# Patient Record
Sex: Female | Born: 1950 | Race: White | Hispanic: No | Smoking: Never smoker
Health system: Southern US, Community
[De-identification: ages and names within clinical notes are randomized; demographics above are authoritative.]

## PROBLEM LIST (undated history)

## (undated) ENCOUNTER — Emergency Department: Admission: EM | Discharge status: Absent without leave | Payer: Self-pay | Source: Home / Self Care

## (undated) DIAGNOSIS — N200 Calculus of kidney: Secondary | ICD-10-CM

## (undated) DIAGNOSIS — E785 Hyperlipidemia, unspecified: Secondary | ICD-10-CM

## (undated) DIAGNOSIS — Z972 Presence of dental prosthetic device (complete) (partial): Secondary | ICD-10-CM

## (undated) DIAGNOSIS — M47819 Spondylosis without myelopathy or radiculopathy, site unspecified: Secondary | ICD-10-CM

## (undated) DIAGNOSIS — Z9889 Other specified postprocedural states: Secondary | ICD-10-CM

## (undated) DIAGNOSIS — R112 Nausea with vomiting, unspecified: Secondary | ICD-10-CM

## (undated) DIAGNOSIS — J4540 Moderate persistent asthma, uncomplicated: Secondary | ICD-10-CM

## (undated) DIAGNOSIS — M1990 Unspecified osteoarthritis, unspecified site: Secondary | ICD-10-CM

## (undated) DIAGNOSIS — Z9989 Dependence on other enabling machines and devices: Secondary | ICD-10-CM

## (undated) DIAGNOSIS — Z8673 Personal history of transient ischemic attack (TIA), and cerebral infarction without residual deficits: Secondary | ICD-10-CM

## (undated) DIAGNOSIS — Z973 Presence of spectacles and contact lenses: Secondary | ICD-10-CM

## (undated) DIAGNOSIS — I10 Essential (primary) hypertension: Secondary | ICD-10-CM

## (undated) DIAGNOSIS — N201 Calculus of ureter: Secondary | ICD-10-CM

## (undated) DIAGNOSIS — Z9981 Dependence on supplemental oxygen: Secondary | ICD-10-CM

## (undated) DIAGNOSIS — G4733 Obstructive sleep apnea (adult) (pediatric): Secondary | ICD-10-CM

## (undated) DIAGNOSIS — R42 Dizziness and giddiness: Secondary | ICD-10-CM

## (undated) DIAGNOSIS — Z6841 Body Mass Index (BMI) 40.0 and over, adult: Secondary | ICD-10-CM

## (undated) DIAGNOSIS — Z8719 Personal history of other diseases of the digestive system: Secondary | ICD-10-CM

## (undated) DIAGNOSIS — Z8619 Personal history of other infectious and parasitic diseases: Secondary | ICD-10-CM

## (undated) DIAGNOSIS — K219 Gastro-esophageal reflux disease without esophagitis: Secondary | ICD-10-CM

## (undated) HISTORY — PX: CARPAL TUNNEL RELEASE: SHX101

## (undated) HISTORY — DX: Dizziness and giddiness: R42

## (undated) HISTORY — PX: COLONOSCOPY: SHX174

## (undated) HISTORY — PX: TONSILLECTOMY: SUR1361

## (undated) HISTORY — DX: Essential (primary) hypertension: I10

## (undated) HISTORY — DX: Hyperlipidemia, unspecified: E78.5

---

## 1993-10-30 HISTORY — PX: BLADDER SURGERY: SHX569

## 1997-10-30 HISTORY — PX: ABDOMINAL HYSTERECTOMY: SHX81

## 1998-06-10 ENCOUNTER — Ambulatory Visit (HOSPITAL_COMMUNITY): Admission: RE | Admit: 1998-06-10 | Discharge: 1998-06-10 | Payer: Self-pay | Admitting: Family Medicine

## 1998-07-07 ENCOUNTER — Ambulatory Visit (HOSPITAL_COMMUNITY): Admission: RE | Admit: 1998-07-07 | Discharge: 1998-07-07 | Payer: Self-pay | Admitting: *Deleted

## 1998-07-28 ENCOUNTER — Ambulatory Visit (HOSPITAL_COMMUNITY): Admission: RE | Admit: 1998-07-28 | Discharge: 1998-07-28 | Payer: Self-pay | Admitting: *Deleted

## 1998-07-28 ENCOUNTER — Encounter: Payer: Self-pay | Admitting: *Deleted

## 1998-11-26 ENCOUNTER — Other Ambulatory Visit: Admission: RE | Admit: 1998-11-26 | Discharge: 1998-11-26 | Payer: Self-pay | Admitting: Obstetrics and Gynecology

## 1998-11-30 ENCOUNTER — Emergency Department (HOSPITAL_COMMUNITY): Admission: EM | Admit: 1998-11-30 | Discharge: 1998-11-30 | Payer: Self-pay

## 1998-12-13 ENCOUNTER — Encounter: Payer: Self-pay | Admitting: Family Medicine

## 1998-12-13 ENCOUNTER — Ambulatory Visit (HOSPITAL_COMMUNITY): Admission: RE | Admit: 1998-12-13 | Discharge: 1998-12-13 | Payer: Self-pay | Admitting: Family Medicine

## 1999-05-23 ENCOUNTER — Encounter (INDEPENDENT_AMBULATORY_CARE_PROVIDER_SITE_OTHER): Payer: Self-pay | Admitting: Specialist

## 1999-05-23 ENCOUNTER — Inpatient Hospital Stay (HOSPITAL_COMMUNITY): Admission: RE | Admit: 1999-05-23 | Discharge: 1999-05-27 | Payer: Self-pay | Admitting: Obstetrics and Gynecology

## 1999-09-20 ENCOUNTER — Encounter: Payer: Self-pay | Admitting: *Deleted

## 1999-09-20 ENCOUNTER — Ambulatory Visit (HOSPITAL_COMMUNITY): Admission: RE | Admit: 1999-09-20 | Discharge: 1999-09-20 | Payer: Self-pay | Admitting: *Deleted

## 2000-05-03 ENCOUNTER — Ambulatory Visit: Admission: RE | Admit: 2000-05-03 | Discharge: 2000-05-03 | Payer: Self-pay | Admitting: *Deleted

## 2000-05-03 ENCOUNTER — Encounter: Payer: Self-pay | Admitting: *Deleted

## 2001-02-27 ENCOUNTER — Ambulatory Visit (HOSPITAL_COMMUNITY): Admission: RE | Admit: 2001-02-27 | Discharge: 2001-02-27 | Payer: Self-pay | Admitting: *Deleted

## 2001-03-14 ENCOUNTER — Encounter: Admission: RE | Admit: 2001-03-14 | Discharge: 2001-03-14 | Payer: Self-pay | Admitting: *Deleted

## 2001-03-14 ENCOUNTER — Encounter: Payer: Self-pay | Admitting: *Deleted

## 2001-05-31 ENCOUNTER — Encounter: Payer: Self-pay | Admitting: Neurosurgery

## 2001-06-04 ENCOUNTER — Encounter: Payer: Self-pay | Admitting: Neurosurgery

## 2001-06-04 ENCOUNTER — Ambulatory Visit (HOSPITAL_COMMUNITY): Admission: RE | Admit: 2001-06-04 | Discharge: 2001-06-05 | Payer: Self-pay | Admitting: Neurosurgery

## 2001-06-04 HISTORY — PX: ANTERIOR CERVICAL DECOMP/DISCECTOMY FUSION: SHX1161

## 2001-07-19 ENCOUNTER — Encounter: Payer: Self-pay | Admitting: Neurosurgery

## 2001-07-19 ENCOUNTER — Ambulatory Visit (HOSPITAL_COMMUNITY): Admission: RE | Admit: 2001-07-19 | Discharge: 2001-07-19 | Payer: Self-pay | Admitting: Neurosurgery

## 2001-09-02 ENCOUNTER — Ambulatory Visit (HOSPITAL_COMMUNITY): Admission: RE | Admit: 2001-09-02 | Discharge: 2001-09-02 | Payer: Self-pay | Admitting: Neurosurgery

## 2001-09-02 ENCOUNTER — Encounter: Payer: Self-pay | Admitting: Neurosurgery

## 2001-09-13 ENCOUNTER — Encounter: Payer: Self-pay | Admitting: Nephrology

## 2001-09-13 ENCOUNTER — Encounter: Admission: RE | Admit: 2001-09-13 | Discharge: 2001-09-13 | Payer: Self-pay | Admitting: Nephrology

## 2001-12-12 ENCOUNTER — Emergency Department (HOSPITAL_COMMUNITY): Admission: EM | Admit: 2001-12-12 | Discharge: 2001-12-12 | Payer: Self-pay | Admitting: Emergency Medicine

## 2001-12-12 ENCOUNTER — Encounter (INDEPENDENT_AMBULATORY_CARE_PROVIDER_SITE_OTHER): Payer: Self-pay

## 2001-12-12 ENCOUNTER — Encounter: Payer: Self-pay | Admitting: Emergency Medicine

## 2002-02-24 ENCOUNTER — Ambulatory Visit (HOSPITAL_COMMUNITY): Admission: RE | Admit: 2002-02-24 | Discharge: 2002-02-24 | Payer: Self-pay | Admitting: *Deleted

## 2002-02-24 ENCOUNTER — Encounter: Payer: Self-pay | Admitting: *Deleted

## 2002-03-14 ENCOUNTER — Encounter: Payer: Self-pay | Admitting: Neurosurgery

## 2002-03-14 ENCOUNTER — Ambulatory Visit (HOSPITAL_COMMUNITY): Admission: RE | Admit: 2002-03-14 | Discharge: 2002-03-14 | Payer: Self-pay | Admitting: Neurosurgery

## 2002-06-12 ENCOUNTER — Ambulatory Visit (HOSPITAL_COMMUNITY): Admission: RE | Admit: 2002-06-12 | Discharge: 2002-06-12 | Payer: Self-pay | Admitting: Family Medicine

## 2002-06-12 ENCOUNTER — Encounter: Payer: Self-pay | Admitting: Family Medicine

## 2002-06-13 ENCOUNTER — Encounter: Payer: Self-pay | Admitting: Neurosurgery

## 2002-06-13 ENCOUNTER — Encounter: Admission: RE | Admit: 2002-06-13 | Discharge: 2002-06-13 | Payer: Self-pay | Admitting: Neurosurgery

## 2002-06-18 ENCOUNTER — Encounter: Payer: Self-pay | Admitting: Nephrology

## 2002-06-18 ENCOUNTER — Ambulatory Visit (HOSPITAL_COMMUNITY): Admission: RE | Admit: 2002-06-18 | Discharge: 2002-06-18 | Payer: Self-pay | Admitting: Nephrology

## 2002-12-19 ENCOUNTER — Ambulatory Visit (HOSPITAL_COMMUNITY): Admission: RE | Admit: 2002-12-19 | Discharge: 2002-12-19 | Payer: Self-pay | Admitting: Family Medicine

## 2002-12-19 ENCOUNTER — Encounter: Payer: Self-pay | Admitting: Family Medicine

## 2003-11-06 ENCOUNTER — Ambulatory Visit (HOSPITAL_COMMUNITY): Admission: RE | Admit: 2003-11-06 | Discharge: 2003-11-06 | Payer: Self-pay | Admitting: Family Medicine

## 2003-11-07 ENCOUNTER — Emergency Department (HOSPITAL_COMMUNITY): Admission: EM | Admit: 2003-11-07 | Discharge: 2003-11-07 | Payer: Self-pay | Admitting: Emergency Medicine

## 2004-04-11 ENCOUNTER — Encounter: Admission: RE | Admit: 2004-04-11 | Discharge: 2004-04-11 | Payer: Self-pay | Admitting: Nephrology

## 2004-06-08 ENCOUNTER — Encounter: Admission: RE | Admit: 2004-06-08 | Discharge: 2004-06-08 | Payer: Self-pay | Admitting: Gastroenterology

## 2004-09-08 ENCOUNTER — Ambulatory Visit: Admission: RE | Admit: 2004-09-08 | Discharge: 2004-09-08 | Payer: Self-pay | Admitting: Family Medicine

## 2005-06-22 ENCOUNTER — Emergency Department (HOSPITAL_COMMUNITY): Admission: EM | Admit: 2005-06-22 | Discharge: 2005-06-22 | Payer: Self-pay | Admitting: Emergency Medicine

## 2005-06-27 ENCOUNTER — Encounter: Admission: RE | Admit: 2005-06-27 | Discharge: 2005-06-27 | Payer: Self-pay | Admitting: Neurosurgery

## 2005-10-10 ENCOUNTER — Encounter: Admission: RE | Admit: 2005-10-10 | Discharge: 2005-10-10 | Payer: Self-pay | Admitting: Nephrology

## 2006-07-26 ENCOUNTER — Ambulatory Visit: Payer: Self-pay | Admitting: Family Medicine

## 2006-08-07 DIAGNOSIS — Z8659 Personal history of other mental and behavioral disorders: Secondary | ICD-10-CM

## 2006-08-07 DIAGNOSIS — I10 Essential (primary) hypertension: Secondary | ICD-10-CM

## 2006-08-07 DIAGNOSIS — J45901 Unspecified asthma with (acute) exacerbation: Secondary | ICD-10-CM | POA: Insufficient documentation

## 2006-10-04 ENCOUNTER — Telehealth (INDEPENDENT_AMBULATORY_CARE_PROVIDER_SITE_OTHER): Payer: Self-pay | Admitting: *Deleted

## 2006-10-24 ENCOUNTER — Ambulatory Visit: Payer: Self-pay | Admitting: Family Medicine

## 2006-11-12 ENCOUNTER — Ambulatory Visit: Payer: Self-pay | Admitting: Family Medicine

## 2006-12-17 ENCOUNTER — Ambulatory Visit: Payer: Self-pay | Admitting: Family Medicine

## 2006-12-19 ENCOUNTER — Ambulatory Visit: Payer: Self-pay | Admitting: Family Medicine

## 2007-01-06 ENCOUNTER — Emergency Department (HOSPITAL_COMMUNITY): Admission: EM | Admit: 2007-01-06 | Discharge: 2007-01-06 | Payer: Self-pay | Admitting: Emergency Medicine

## 2007-01-08 ENCOUNTER — Telehealth (INDEPENDENT_AMBULATORY_CARE_PROVIDER_SITE_OTHER): Payer: Self-pay | Admitting: *Deleted

## 2007-01-10 ENCOUNTER — Encounter: Payer: Self-pay | Admitting: Family Medicine

## 2007-01-15 ENCOUNTER — Ambulatory Visit (HOSPITAL_COMMUNITY): Admission: RE | Admit: 2007-01-15 | Discharge: 2007-01-15 | Payer: Self-pay | Admitting: General Surgery

## 2007-01-15 ENCOUNTER — Encounter: Payer: Self-pay | Admitting: Family Medicine

## 2007-01-15 ENCOUNTER — Encounter (INDEPENDENT_AMBULATORY_CARE_PROVIDER_SITE_OTHER): Payer: Self-pay | Admitting: *Deleted

## 2007-01-15 HISTORY — PX: LAPAROSCOPIC CHOLECYSTECTOMY: SUR755

## 2007-01-17 ENCOUNTER — Ambulatory Visit: Payer: Self-pay | Admitting: Family Medicine

## 2007-01-21 ENCOUNTER — Inpatient Hospital Stay (HOSPITAL_COMMUNITY): Admission: RE | Admit: 2007-01-21 | Discharge: 2007-01-24 | Payer: Self-pay | Admitting: Orthopedic Surgery

## 2007-01-21 HISTORY — PX: TOTAL KNEE ARTHROPLASTY: SHX125

## 2007-02-09 ENCOUNTER — Encounter: Admission: RE | Admit: 2007-02-09 | Discharge: 2007-02-09 | Payer: Self-pay | Admitting: Orthopedic Surgery

## 2007-02-11 ENCOUNTER — Encounter: Payer: Self-pay | Admitting: Family Medicine

## 2007-07-11 ENCOUNTER — Ambulatory Visit: Payer: Self-pay | Admitting: Family Medicine

## 2007-07-15 ENCOUNTER — Telehealth: Payer: Self-pay | Admitting: Family Medicine

## 2007-07-29 ENCOUNTER — Telehealth: Payer: Self-pay | Admitting: Family Medicine

## 2007-11-01 ENCOUNTER — Ambulatory Visit: Payer: Self-pay | Admitting: Family Medicine

## 2008-05-12 ENCOUNTER — Ambulatory Visit: Payer: Self-pay | Admitting: Family Medicine

## 2008-05-12 ENCOUNTER — Encounter: Admission: RE | Admit: 2008-05-12 | Discharge: 2008-05-12 | Payer: Self-pay | Admitting: Family Medicine

## 2008-05-13 ENCOUNTER — Telehealth (INDEPENDENT_AMBULATORY_CARE_PROVIDER_SITE_OTHER): Payer: Self-pay | Admitting: *Deleted

## 2008-05-14 ENCOUNTER — Telehealth: Payer: Self-pay | Admitting: Family Medicine

## 2008-05-18 ENCOUNTER — Ambulatory Visit: Payer: Self-pay | Admitting: Family Medicine

## 2008-05-19 ENCOUNTER — Encounter: Payer: Self-pay | Admitting: Family Medicine

## 2008-05-19 ENCOUNTER — Ambulatory Visit: Payer: Self-pay

## 2008-05-20 ENCOUNTER — Ambulatory Visit: Payer: Self-pay

## 2008-09-03 ENCOUNTER — Ambulatory Visit: Payer: Self-pay | Admitting: Family Medicine

## 2008-10-19 ENCOUNTER — Encounter: Payer: Self-pay | Admitting: Family Medicine

## 2008-10-20 ENCOUNTER — Encounter: Payer: Self-pay | Admitting: Family Medicine

## 2009-01-06 ENCOUNTER — Ambulatory Visit: Payer: Self-pay | Admitting: Family Medicine

## 2009-01-06 LAB — CONVERTED CEMR LAB
Cholesterol, target level: 200 mg/dL
HDL goal, serum: 40 mg/dL

## 2009-01-08 LAB — CONVERTED CEMR LAB
BUN: 17 mg/dL (ref 6–23)
Band Neutrophils: 0 % (ref 0–10)
Basophils Absolute: 0 10*3/uL (ref 0.0–0.1)
Basophils Relative: 0 % (ref 0–1)
Calcium: 9.6 mg/dL (ref 8.4–10.5)
Creatinine, Ser: 0.98 mg/dL (ref 0.40–1.20)
Eosinophils Relative: 3 % (ref 0–5)
Glucose, Bld: 97 mg/dL (ref 70–99)
Lymphocytes Relative: 39 % (ref 12–46)
MCV: 91.5 fL (ref 78.0–100.0)
Monocytes Absolute: 0.5 10*3/uL (ref 0.1–1.0)
Platelets: 270 10*3/uL (ref 150–400)
Potassium: 4.2 meq/L (ref 3.5–5.3)
RDW: 13.8 % (ref 11.5–15.5)
WBC: 9.1 10*3/uL (ref 4.0–10.5)

## 2009-01-11 ENCOUNTER — Encounter: Admission: RE | Admit: 2009-01-11 | Discharge: 2009-01-11 | Payer: Self-pay | Admitting: Family Medicine

## 2009-01-13 ENCOUNTER — Telehealth: Payer: Self-pay | Admitting: Family Medicine

## 2009-01-14 ENCOUNTER — Encounter: Payer: Self-pay | Admitting: Family Medicine

## 2009-01-15 ENCOUNTER — Telehealth: Payer: Self-pay | Admitting: Family Medicine

## 2009-01-15 DIAGNOSIS — Z9989 Dependence on other enabling machines and devices: Secondary | ICD-10-CM

## 2009-01-15 DIAGNOSIS — G4733 Obstructive sleep apnea (adult) (pediatric): Secondary | ICD-10-CM | POA: Insufficient documentation

## 2009-01-16 ENCOUNTER — Ambulatory Visit: Payer: Self-pay | Admitting: Family Medicine

## 2009-01-22 ENCOUNTER — Encounter: Payer: Self-pay | Admitting: Family Medicine

## 2009-02-01 ENCOUNTER — Encounter: Payer: Self-pay | Admitting: Family Medicine

## 2009-03-08 ENCOUNTER — Ambulatory Visit: Payer: Self-pay | Admitting: Family Medicine

## 2009-03-08 DIAGNOSIS — M2550 Pain in unspecified joint: Secondary | ICD-10-CM

## 2009-03-08 LAB — CONVERTED CEMR LAB
Bilirubin Urine: NEGATIVE
Glucose, Urine, Semiquant: NEGATIVE
Protein, U semiquant: NEGATIVE
Specific Gravity, Urine: 1.02
WBC Urine, dipstick: NEGATIVE
pH: 5.5

## 2009-03-09 ENCOUNTER — Encounter: Payer: Self-pay | Admitting: Family Medicine

## 2009-03-09 LAB — CONVERTED CEMR LAB
ALT: 15 units/L (ref 0–35)
AST: 18 units/L (ref 0–37)
Alkaline Phosphatase: 69 units/L (ref 39–117)
BUN: 17 mg/dL (ref 6–23)
Calcium: 9.2 mg/dL (ref 8.4–10.5)
Chloride: 105 meq/L (ref 96–112)
Creatinine, Ser: 0.86 mg/dL (ref 0.40–1.20)
Sed Rate: 22 mm/hr (ref 0–22)
Total Bilirubin: 0.4 mg/dL (ref 0.3–1.2)

## 2009-03-16 ENCOUNTER — Ambulatory Visit: Payer: Self-pay | Admitting: Family Medicine

## 2009-03-16 DIAGNOSIS — R002 Palpitations: Secondary | ICD-10-CM | POA: Insufficient documentation

## 2009-03-17 ENCOUNTER — Encounter: Admission: RE | Admit: 2009-03-17 | Discharge: 2009-03-17 | Payer: Self-pay | Admitting: Family Medicine

## 2009-03-17 ENCOUNTER — Telehealth (INDEPENDENT_AMBULATORY_CARE_PROVIDER_SITE_OTHER): Payer: Self-pay | Admitting: *Deleted

## 2009-03-17 ENCOUNTER — Encounter: Payer: Self-pay | Admitting: Family Medicine

## 2009-03-18 LAB — CONVERTED CEMR LAB
Basophils Absolute: 0 10*3/uL (ref 0.0–0.1)
Eosinophils Relative: 3 % (ref 0–5)
HCT: 36.9 % (ref 36.0–46.0)
Hemoglobin: 12.2 g/dL (ref 12.0–15.0)
Lymphocytes Relative: 30 % (ref 12–46)
Lymphs Abs: 3 10*3/uL (ref 0.7–4.0)
Monocytes Absolute: 0.7 10*3/uL (ref 0.1–1.0)
Neutro Abs: 6 10*3/uL (ref 1.7–7.7)
RDW: 13.7 % (ref 11.5–15.5)
Saturation Ratios: 14 % — ABNORMAL LOW (ref 20–55)
TIBC: 341 ug/dL (ref 250–470)
TSH: 3.91 microintl units/mL (ref 0.350–4.500)
WBC: 9.9 10*3/uL (ref 4.0–10.5)

## 2009-03-19 ENCOUNTER — Ambulatory Visit: Payer: Self-pay

## 2009-03-25 ENCOUNTER — Encounter: Payer: Self-pay | Admitting: Family Medicine

## 2009-05-04 ENCOUNTER — Telehealth: Payer: Self-pay | Admitting: Family Medicine

## 2009-05-05 ENCOUNTER — Encounter: Admission: RE | Admit: 2009-05-05 | Discharge: 2009-05-05 | Payer: Self-pay | Admitting: Neurosurgery

## 2009-06-11 ENCOUNTER — Encounter: Admission: RE | Admit: 2009-06-11 | Discharge: 2009-06-11 | Payer: Self-pay | Admitting: Family Medicine

## 2009-06-11 ENCOUNTER — Ambulatory Visit: Payer: Self-pay | Admitting: Family Medicine

## 2009-06-11 DIAGNOSIS — M545 Low back pain: Secondary | ICD-10-CM

## 2009-06-21 ENCOUNTER — Encounter: Admission: RE | Admit: 2009-06-21 | Discharge: 2009-06-21 | Payer: Self-pay | Admitting: Neurosurgery

## 2009-06-28 ENCOUNTER — Ambulatory Visit (HOSPITAL_COMMUNITY): Admission: RE | Admit: 2009-06-28 | Discharge: 2009-06-28 | Payer: Self-pay | Admitting: Neurosurgery

## 2009-08-01 ENCOUNTER — Ambulatory Visit: Payer: Self-pay | Admitting: Family Medicine

## 2009-08-09 ENCOUNTER — Ambulatory Visit: Payer: Self-pay | Admitting: Family Medicine

## 2009-08-10 ENCOUNTER — Telehealth: Payer: Self-pay | Admitting: Family Medicine

## 2009-08-12 ENCOUNTER — Telehealth: Payer: Self-pay | Admitting: Family Medicine

## 2009-08-12 ENCOUNTER — Ambulatory Visit: Payer: Self-pay | Admitting: Family Medicine

## 2009-10-05 ENCOUNTER — Emergency Department (HOSPITAL_COMMUNITY): Admission: EM | Admit: 2009-10-05 | Discharge: 2009-10-05 | Payer: Self-pay | Admitting: Emergency Medicine

## 2009-10-07 ENCOUNTER — Ambulatory Visit: Payer: Self-pay | Admitting: Family Medicine

## 2009-10-07 ENCOUNTER — Emergency Department (HOSPITAL_BASED_OUTPATIENT_CLINIC_OR_DEPARTMENT_OTHER): Admission: EM | Admit: 2009-10-07 | Discharge: 2009-10-07 | Payer: Self-pay | Admitting: Emergency Medicine

## 2009-10-07 ENCOUNTER — Ambulatory Visit: Payer: Self-pay | Admitting: Radiology

## 2009-10-07 DIAGNOSIS — R609 Edema, unspecified: Secondary | ICD-10-CM | POA: Insufficient documentation

## 2009-10-08 ENCOUNTER — Encounter (INDEPENDENT_AMBULATORY_CARE_PROVIDER_SITE_OTHER): Payer: Self-pay | Admitting: *Deleted

## 2009-10-12 ENCOUNTER — Ambulatory Visit: Payer: Self-pay | Admitting: Internal Medicine

## 2009-10-25 ENCOUNTER — Emergency Department (HOSPITAL_BASED_OUTPATIENT_CLINIC_OR_DEPARTMENT_OTHER): Admission: EM | Admit: 2009-10-25 | Discharge: 2009-10-25 | Payer: Self-pay | Admitting: Emergency Medicine

## 2009-10-25 ENCOUNTER — Ambulatory Visit: Payer: Self-pay | Admitting: Diagnostic Radiology

## 2009-11-01 ENCOUNTER — Ambulatory Visit (HOSPITAL_COMMUNITY): Admission: RE | Admit: 2009-11-01 | Discharge: 2009-11-01 | Payer: Self-pay | Admitting: Neurosurgery

## 2009-11-24 ENCOUNTER — Ambulatory Visit: Payer: Self-pay | Admitting: Internal Medicine

## 2009-11-29 ENCOUNTER — Telehealth (INDEPENDENT_AMBULATORY_CARE_PROVIDER_SITE_OTHER): Payer: Self-pay | Admitting: *Deleted

## 2009-12-17 ENCOUNTER — Telehealth: Payer: Self-pay | Admitting: Family Medicine

## 2009-12-17 ENCOUNTER — Encounter: Payer: Self-pay | Admitting: Family Medicine

## 2010-01-04 ENCOUNTER — Encounter: Admission: RE | Admit: 2010-01-04 | Discharge: 2010-01-04 | Payer: Self-pay | Admitting: Neurosurgery

## 2010-04-04 ENCOUNTER — Ambulatory Visit: Payer: Self-pay | Admitting: Family Medicine

## 2010-04-04 DIAGNOSIS — S239XXA Sprain of unspecified parts of thorax, initial encounter: Secondary | ICD-10-CM

## 2010-04-04 DIAGNOSIS — M79609 Pain in unspecified limb: Secondary | ICD-10-CM

## 2010-04-18 ENCOUNTER — Encounter: Admission: RE | Admit: 2010-04-18 | Discharge: 2010-04-18 | Payer: Self-pay | Admitting: Family Medicine

## 2010-04-19 LAB — HM MAMMOGRAPHY

## 2010-05-19 ENCOUNTER — Ambulatory Visit: Payer: Self-pay | Admitting: Family Medicine

## 2010-05-23 ENCOUNTER — Encounter: Payer: Self-pay | Admitting: Family Medicine

## 2010-08-12 ENCOUNTER — Emergency Department (HOSPITAL_BASED_OUTPATIENT_CLINIC_OR_DEPARTMENT_OTHER): Admission: EM | Admit: 2010-08-12 | Discharge: 2010-08-12 | Payer: Self-pay | Admitting: Emergency Medicine

## 2010-08-15 ENCOUNTER — Encounter: Payer: Self-pay | Admitting: Family Medicine

## 2010-10-26 ENCOUNTER — Encounter: Payer: Self-pay | Admitting: Family Medicine

## 2010-10-26 ENCOUNTER — Ambulatory Visit
Admission: RE | Admit: 2010-10-26 | Discharge: 2010-10-26 | Payer: Self-pay | Source: Home / Self Care | Attending: Family Medicine | Admitting: Family Medicine

## 2010-10-26 DIAGNOSIS — I809 Phlebitis and thrombophlebitis of unspecified site: Secondary | ICD-10-CM | POA: Insufficient documentation

## 2010-10-26 LAB — CONVERTED CEMR LAB
Hemoglobin: 13.2 g/dL (ref 12.0–15.0)
Lymphocytes Relative: 32 % (ref 12–46)
Lymphs Abs: 3.4 10*3/uL (ref 0.7–4.0)
Neutro Abs: 6.5 10*3/uL (ref 1.7–7.7)
Platelets: 269 10*3/uL (ref 150–400)
RDW: 13.5 % (ref 11.5–15.5)
WBC: 10.6 10*3/uL — ABNORMAL HIGH (ref 4.0–10.5)

## 2010-10-28 LAB — CONVERTED CEMR LAB
ANA Titer 1: 1:40 {titer} — ABNORMAL HIGH
Anti Nuclear Antibody(ANA): POSITIVE — AB
CRP: 2 mg/dL — ABNORMAL HIGH (ref <0.6)
Rhuematoid fact SerPl-aCnc: <20 intl units/mL (ref 0–20)

## 2010-11-03 ENCOUNTER — Encounter: Payer: Self-pay | Admitting: Family Medicine

## 2010-11-03 ENCOUNTER — Ambulatory Visit
Admission: RE | Admit: 2010-11-03 | Discharge: 2010-11-03 | Payer: Self-pay | Source: Home / Self Care | Admitting: Family Medicine

## 2010-11-20 ENCOUNTER — Encounter: Payer: Self-pay | Admitting: Neurosurgery

## 2010-11-27 LAB — CONVERTED CEMR LAB
ALT: 18 units/L (ref 0–35)
AST: 23 units/L (ref 0–37)
Albumin: 4.3 g/dL (ref 3.5–5.2)
Alkaline Phosphatase: 62 units/L (ref 39–117)
Basophils Absolute: 0.1 10*3/uL (ref 0.0–0.1)
Basophils Relative: 1 % (ref 0–1)
Calcium: 9.5 mg/dL (ref 8.4–10.5)
Chloride: 104 meq/L (ref 96–112)
LDL Cholesterol: 96 mg/dL (ref 0–99)
MCHC: 32.6 g/dL (ref 30.0–36.0)
Neutro Abs: 5.5 10*3/uL (ref 1.7–7.7)
Neutrophils Relative %: 59 % (ref 43–77)
Platelets: 297 10*3/uL (ref 150–400)
Potassium: 4.4 meq/L (ref 3.5–5.3)
RBC: 4.59 M/uL (ref 3.87–5.11)
RDW: 13.9 % (ref 11.5–15.5)
Sed Rate: 31 mm/hr — ABNORMAL HIGH (ref 0–22)
Sodium: 141 meq/L (ref 135–145)
TSH: 4.111 microintl units/mL (ref 0.350–4.50)

## 2010-12-01 NOTE — Assessment & Plan Note (Signed)
Summary: PRESSURE IN HEAD AND EARS/WSE (rm 4)   Vital Signs:  Patient Profile:   60 Years Old Female CC:      fever, chills, body aches, dry cough, congestion x 3 days Height:     65.5 inches Weight:      260 pounds O2 Sat:      95 % O2 treatment:    Room Air Temp:     99.7 degrees F oral Pulse rate:   105 / minute Resp:     18 per minute BP sitting:   138 / 78  (right arm) Cuff size:   large  Vitals Entered By: Lajean Saver RN (November 03, 2010 6:03 PM)                  Updated Prior Medication List: TEKTURNA HCT 300-12.5 MG TABS (ALISKIREN-HYDROCHLOROTHIAZIDE) Take 1 tab by mouth once daily ASPIRIN 325 MG TABS (ASPIRIN) take two tablets by mouth every six hours  Current Allergies (reviewed today): ! ACETAMINOPHEN (ACETAMINOPHEN) ! E.E.S. 400 (ERYTHROMYCIN ETHYLSUCCINATE) ! MORPHINE SULFATE CR (MORPHINE SULFATE) ! SULFA ! * HYZAAR ! DIOVANHistory of Present Illness Chief Complaint: fever, chills, body aches, dry cough, congestion x 3 days History of Present Illness:  Subjective: Patient complains of sore throat for 2 days. + cough, worse at night, non-productive No pleuritic pain + wheezing + nasal congestion No post-nasal drainage No sinus pain/pressure No itchy/red eyes No earache No hemoptysis +SOB No fever, + chills No nausea No vomiting No abdominal pain No diarrhea No skin rashes + fatigue + myalgias No headache Used OTC meds without relief   REVIEW OF SYSTEMS Constitutional Symptoms       Complains of fever, chills, and fatigue.     Denies night sweats, weight loss, and weight gain.      Comments: body aches Eyes       Denies change in vision, eye pain, eye discharge, glasses, contact lenses, and eye surgery. Ear/Nose/Throat/Mouth       Complains of sinus problems.      Denies hearing loss/aids, change in hearing, ear pain, ear discharge, dizziness, frequent runny nose, frequent nose bleeds, sore throat, hoarseness, and tooth pain or  bleeding.  Respiratory       Complains of dry cough and shortness of breath.      Denies productive cough, wheezing, asthma, bronchitis, and emphysema/COPD.  Cardiovascular       Denies murmurs, chest pain, and tires easily with exhertion.    Gastrointestinal       Denies stomach pain, nausea/vomiting, diarrhea, constipation, blood in bowel movements, and indigestion. Genitourniary       Denies painful urination, kidney stones, and loss of urinary control. Neurological       Denies paralysis, seizures, and fainting/blackouts. Musculoskeletal       Denies muscle pain, joint pain, joint stiffness, decreased range of motion, redness, swelling, muscle weakness, and gout.  Skin       Denies bruising, unusual mles/lumps or sores, and hair/skin or nail changes.  Psych       Denies mood changes, temper/anger issues, anxiety/stress, speech problems, depression, and sleep problems. Other Comments: Symptoms started 3 days ago. Taken Delsym and antihistamine. Patient finished a course of Prednisone today prescribed by Dr. Linford Arnold for her knees   Past History:  Past Medical History: Reviewed history from 05/19/2010 and no changes required. ARM NUMBNESS (ICD-782.0) PALPITATIONS (ICD-785.1) ORTHOSTATIC DIZZINESS (ICD-780.4) PAIN IN JOINT, MULTIPLE SITES (ICD-719.49) OBSTRUCTIVE SLEEP APNEA (ICD-327.23) CANDIDIASIS, SKIN/NAILS (  ICD-112.3) HYPERTENSION, BENIGN SYSTEMIC (ICD-401.1)     - Rec d/c ACE October 13, 2009 for pseudoasthma >  dramatic improvement  ASTHMA NOS W/ACUTE EXACERBATION [phone])     - PFT's wnl November 24, 2009 during ? uri cough DEPRESSION, MAJOR, RECURRENT (ICD-296.30) Hx of proteinuria -See Dr. Briant Cedar Cardiolyte stress test neg 05-19-08 at [facility] degenerative spine  Past Surgical History: Reviewed history from 07/11/2007 and no changes required. Bladder surgery  1995, c/sec and tubal ligation 1979 Hysterectomy  1999, Neck sx for ruptured disc C4-5   2001 Tonsillectomy  age 49 Dr.Tim Davis-cholecystectomy 3/08 Knee replacment in 2008  Family History: Reviewed history from 10/12/2009 and no changes required. 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  Never smoked, no EtOH, no drugs, no caffeine, swims occ for exercise.   Objective:  No acute distress  Eyes:  Pupils are equal, round, and reactive to light and accomdation.  Extraocular movement is intact.  Conjunctivae are not inflamed.  Ears:  Canals normal.  Tympanic membranes normal.   Nose:  Normal septum.  Normal turbinates, mildly congested.    No sinus tenderness present.  Pharynx:  Normal  Neck:  Supple.  Slightly tender shotty /posterior nodes are palpated bilaterally.  Lungs:  Clear to auscultation.  Breath sounds are equal.  Heart:  Regular rate and rhythm without murmurs, rubs, or gallops.  Abdomen:  Nontender without masses or hepatosplenomegaly.  Bowel sounds are present.  No CVA or flank tenderness.  Skin:  No rash Assessment New Problems: UPPER RESPIRATORY INFECTION, ACUTE (ICD-465.9)   Plan New Medications/Changes: AZITHROMYCIN 250 MG TABS (AZITHROMYCIN) Two tabs by mouth on day 1, then 1 tab daily on days 2 through 5 (Rx void after 11/11/10)  #6 tabs x 0, 11/03/2010, Donna Christen MD BENZONATATE 200 MG CAPS (BENZONATATE) One by mouth hs as needed cough  #12 x 0, 11/03/2010, Donna Christen MD  New Orders: Pulse Oximetry (single measurment) [94760] Est. Patient Level III [54098] Planning Comments:   Treat symptomatically for now:  Increase fluid intake, begin expectorant/decongestant, cough suppressant at bedtime.  If fever/chills/sweats persist, or if not improving 5   days begin Z-pack (given Rx to hold).  Followup with PCP if not improving 10 to 14 days.   The patient and/or caregiver has been counseled thoroughly with regard to medications prescribed including dosage, schedule, interactions, rationale for use, and possible side effects and they verbalize understanding.  Diagnoses and expected course of recovery discussed and will return if not improved as expected or if the condition worsens. Patient and/or caregiver verbalized understanding.  Prescriptions: AZITHROMYCIN 250 MG TABS (AZITHROMYCIN) Two tabs by mouth on day 1, then 1 tab daily on days 2 through 5 (Rx void after 11/11/10)  #6 tabs x 0   Entered and Authorized by:   Donna Christen MD   Signed by:   Donna Christen MD on 11/03/2010   Method used:   Print then Give to Patient   RxID:   [id number] BENZONATATE 200 MG CAPS (BENZONATATE) One by mouth hs as needed cough  #12 x 0   Entered and Authorized by:   Donna Christen MD   Signed by:   Donna Christen MD on 11/03/2010  Method used:   Print then Give to Patient   RxID:   [id number]   Patient Instructions: 1)  May use Mucinex  (guaifenesin) twice daily for congestion. 2)  Increase fluid intake, rest. 3)  May use Afrin nasal spray (or generic oxymetazoline) twice daily for about 5 days.  Also recommend using saline nasal spray several times daily and/or saline nasal irrigation. 4)  Begin azithromycin if not improving about 5 days or if persistent fever develops 5)  Followup with family doctor if not improving 10 to 14 days.   Orders Added: 1)  Pulse Oximetry (single measurment) [94760] 2)  Est. Patient Level III [14782]

## 2010-12-01 NOTE — Assessment & Plan Note (Signed)
Summary: Foot pain, f/u phlebitis, HTN   Vital Signs:  Patient profile:   60 year old female Height:      65.5 inches Weight:      269 pounds Pulse rate:   84 / minute BP sitting:   118 / 69  (right arm) Cuff size:   large  Vitals Entered By: Avon Gully CMA, Duncan Dull) (October 26, 2010 9:11 AM) CC: pain in feet and legs,fatigue,dx with phlebitis a few weeks ago take asprin for this   Primary Care Provider:  Nani Gasser MD  CC:  pain in feet and legs, fatigue, and dx with phlebitis a few weeks ago take asprin for this.  History of Present Illness: pain in feet and legs,fatigue,dx with phlebitis a few weeks ago take asprin for this. Pain and redness started on her left anterior leg and dx with superficial clots via [place]. Dxwith phlebitis.  Taking 2 adult ASA every 6 hours. Has been taking now for 2 weeks and has reduced to ASA two times a day. Legs and feet are painful when up on her feet for a long period.  When sits for a long time they feel stiff and painful. Every morning having pain in her bladder area that radiates to her back. No dysuria or itching.  No fever. At nigh will wake up iwth a hot shooting pain in a half circuleover the medial mid foot on both feet.  Does have hx of low back pain. This started about a month ago. Feels some better when moves her feet around. othign really seem to help her pain. Has some prednisone left over from ortho given to her for her knee. Has used it since last Spring.   Current Medications (verified): 1)  Tekturna Hct 300-12.5 Mg Tabs (Aliskiren-Hydrochlorothiazide) .... Take 1 Tab By Mouth Once Daily 2)  Aspirin 325 Mg Tabs (Aspirin) .... Take Two Tablets By Mouth Every Six Hours  Allergies (verified): 1)  ! Acetaminophen (Acetaminophen) 2)  ! E.e.s. 400 (Erythromycin Ethylsuccinate) 3)  ! Morphine Sulfate Cr (Morphine Sulfate) 4)  ! Sulfa 5)  ! * Hyzaar 6)  ! Diovan  Comments:  Nurse/Medical Assistant: The patient's medications  and allergies were reviewed with the patient and were updated in the Medication and Allergy Lists. 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  Has 3 adult children.  Never smoked, no EtOH, no drugs, no caffeine, swims occ for exercise.  Physical Exam  General:  Well-developed,well-nourished,in no acute distress; alert,appropriate and cooperative throughout examination Head:  Normocephalic and atraumatic without obvious abnormalities. No apparent alopecia or balding. Msk:  Left anterior shin with some redness and swelling. Erythema is midl and not well demarcated. TEnder to palpate.  Ankles with NROM adn no swelling.  NO laxity. Strength 5/5 in teh ankles.   Extremities:  DP pulses 2+ bilat.  Skin:  Dry skin on the bottoms of her feet.    Impression & Recommendations:  Problem # 1:  FOOT PAIN, BILATERAL (ICD-729.5) Unclear etiology. Doesn't sound classic for rheumatoi but will check a sed rate.  Check CBC to rule out blood d/o and will check thyroid as welll. Also check uric acid level thought not classic for gout eithr. She may have more structual foot pain. May benefit from orthotics and good shoes.  She also has a hx of severe OA  in her knee and back so this may be contributing as well. Neuroma is less likely based on the position of her pain.  Orders: T-CBC w/Diff [phone]) T-TSH [phone]) T-Sed Rate (Automated) [phone]) T-Uric Acid (Blood) [phone])  Problem # 2:  PHLEBITIS (ICD-451.9) Recommend continue her ASA for 1 more week and use ACE wrap for compression.  Her updated medication list for this problem includes:    Aspirin 325 Mg Tabs (Aspirin) .Marland Kitchen... Take two tablets by mouth every six hours  Problem # 3:  HYPERTENSION, BENIGN SYSTEMIC (ICD-401.1) Looks fantastic.  If continue to loose weight will be  able to dec her BP med.  Her updated medication list for this problem includes:    Tekturna Hct 300-12.5 Mg Tabs (Aliskiren-hydrochlorothiazide) .Marland Kitchen... Take 1 tab by mouth once daily  Complete Medication List: 1)  Tekturna Hct 300-12.5 Mg Tabs (Aliskiren-hydrochlorothiazide) .... Take 1 tab by mouth once daily 2)  Aspirin 325 Mg Tabs (Aspirin) .... Take two tablets by mouth every six hours   Patient Instructions: 1)  prednisone 5mg  p two times a day for one week adn see if you feel better. If not better then let me know.  2)  Recommend continue her ASA for 1 more week and use ACE wrap for compression.    Orders Added: 1)  T-CBC w/Diff [[id number]] 2)  T-TSH [[id number]] 3)  T-Sed Rate (Automated) [03474-25956] 4)  T-Uric Acid (Blood) [84550-23180] 5)  Est. Patient Level IV [38756]

## 2010-12-01 NOTE — Progress Notes (Signed)
Summary: Avalide changed to Diovan  Phone Note From Pharmacy Call back at [phone]   Caller: [facility] [address]* Call For: wert  Summary of Call: calling about avalide 300/12.5 . manufacturer have recall need alternative Initial call taken by: Rickard Patience,  November 29, 2009 3:33 PM  Follow-up for Phone Call        MW---the Adan Sis has been recalled---what can we change this to?  please advise.  thanks [address] [facility]  November 29, 2009 3:34 PM  diovan 320/12/5 one dialy Follow-up by: Nyoka Cowden MD,  November 29, 2009 4:53 PM  Additional Follow-up for Phone Call Additional follow up Details #1::        [address], Spoke with Elmo.  informed Benny ok per MW to chagne avalide to Diovan 320/12.5 once daily #30 x5.  He verbalized understanding.  Med changed in EMR.   Additional Follow-up by: Gweneth Dimitri RN,  November 29, 2009 5:04 PM    New/Updated Medications: DIOVAN HCT 320-12.5 MG TABS (VALSARTAN-HYDROCHLOROTHIAZIDE) once daily Prescriptions: DIOVAN HCT 320-12.5 MG TABS (VALSARTAN-HYDROCHLOROTHIAZIDE) once daily  #30 x 5   Entered by:   Gweneth Dimitri RN   Authorized by:   Nyoka Cowden MD   Signed by:   Gweneth Dimitri RN on 11/29/2009   Method used:   Telephoned to ...       [facility]* (retail)       [address] [place], [place]  [address]       Ph: [phone]       Fax: [phone]   RxID:   [id number]

## 2010-12-01 NOTE — Letter (Signed)
Summary: Out of Work  [facility]  [address]   [place], [place] [address]   Phone: [phone]  Fax: [phone]    April 04, 2010   Employee:  JERICCA RUSSETT [employer]    To Whom It May Concern:   For Medical reasons, please excuse the above named employee from work for the following dates:  Start:   June 6th  End:   June 7th  If you need additional information, please feel free to contact our office.         Sincerely,    Seymour Bars DO

## 2010-12-01 NOTE — Progress Notes (Signed)
Summary: Wants to stop zoloft  Call pt: It would be better if took every other day for 2 weeks and then stop.  February 18, 20112:32 PM Gerrod Maule MD, Santina Evans  12/17/2009 @ 2:39pm- Pt notified of results. KJ LPN

## 2010-12-01 NOTE — Assessment & Plan Note (Signed)
Summary: FOOT AND BACK PAIN//VGJ   Vital Signs:  Patient profile:   60 year old female Height:      65.5 inches Weight:      288 pounds BMI:     47.37 O2 Sat:      98 % on Room air Pulse rate:   91 / minute BP sitting:   135 / 78  (left arm) Cuff size:   large  Vitals Entered By: Payton Spark CMA (April 04, 2010 10:29 AM)  O2 Flow:  Room air CC: Mid back pain x 1 week and L foot pain. No known injury.    Primary Care Provider:  Nani Gasser MD  CC:  Mid back pain x 1 week and L foot pain. No known injury. Marland Kitchen  History of Present Illness: 59 yo WF presents for thoracic pain x 1 wk.  She has Lumbar DDD and is getting injejctions for this thru the neurosurgeon.  She is using Lidoderm patches over the lateral thoracic muscles.  She is not sure how this started.  She was not doing any heavy lifting or overuse.  For the past 2 days, her L foot started hurting.  She denies any injury.  She has had plantar fasciitis in the past on the R side.    She is taking Ibuprofen which helped a little bit.    Current Medications (verified): 1)  Tekturna Hct 300-12.5 Mg Tabs (Aliskiren-Hydrochlorothiazide) .... Take 1 Tab By Mouth Once Daily  Allergies (verified): 1)  ! Acetaminophen (Acetaminophen) 2)  ! E.e.s. 400 (Erythromycin Ethylsuccinate) 3)  ! Morphine Sulfate Cr (Morphine Sulfate) 4)  ! Sulfa 5)  ! * Hyzaar 6)  ! Diovan  Past History:  Past Medical History: Reviewed history from 11/24/2009 and no changes required. ARM NUMBNESS (ICD-782.0) PALPITATIONS (ICD-785.1) ORTHOSTATIC DIZZINESS (ICD-780.4) PAIN IN JOINT, MULTIPLE SITES (ICD-719.49) FREQUENCY, URINARY (ICD-788.41) FINGER SPRAIN (ICD-842.10) OBSTRUCTIVE SLEEP APNEA (ICD-327.23) SNORING (ICD-786.09) INSECT BITE (ICD-919.4) CHEST PAIN, LEFT (ICD-786.50) SINUSITIS, ACUTE (ICD-461.9) CANDIDIASIS, SKIN/NAILS (ICD-112.3) HYPERTENSION, BENIGN SYSTEMIC (ICD-401.1)     - Rec d/c ACE October 13, 2009 for pseudoasthma  >  dramatic improvement  ASTHMA NOS W/ACUTE EXACERBATION (ONG-295.28)     - PFT's wnl November 24, 2009 during ? uri cough DEPRESSION, MAJOR, RECURRENT (ICD-296.30)   Hx of proteinuria -See Dr. Briant Cedar Cardiolyte stress test neg 05-19-08 at [facility]  Social History: Reviewed history from 08/07/2006 and no changes required. 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  Never smoked, no EtOH, no drugs, no caffeine, swims occ for exercise.  Review of Systems      See HPI  Physical Exam  General:  morbidly obese WF in NAD Lungs:  Normal respiratory effort, chest expands symmetrically. Lungs are clear to auscultation, no crackles or wheezes. Heart:  Normal rate and regular rhythm. S1 and S2 normal without gallop, murmur, click, rub or other extra sounds. Msk:  tender over R lateral thoracic muscles (rhomboids/ lats) with limited rotation bilat and limited SB.  full UE ROM Pulses:  2+ pedal pulses Extremities:  tender over the medial arch L foot.  fairly normal foot anatomy with tender supination/ neg heel squeeze   Impression & Recommendations:  Problem # 1:  THORACIC STRAIN (ICD-847.1) MSK throacic back pain.  Will treat with Mobic during the day and flexeril at night + warm moist heat and gentle stretching.  If not improved in 2 wks, will  start PT and consider Xray.  Inactivity and obesity may be constributing factors.  Problem # 2:  FOOT PAIN, LEFT (ICD-729.5) Just a couple days of L foot pain right at the arch-- likely to be mechanical foot pain given her hx and exam findings.  She is wearing ill fitting poorly supportive sandals today and given her BMI of 47, this is not enough support for her.  Advisedchanging footwears to running shoes everyday x 2 wks.  If not improving, she is free to go back to her podiatrist.  Complete Medication List: 1)  Tekturna Hct 300-12.5 Mg Tabs (Aliskiren-hydrochlorothiazide)  .... Take 1 tab by mouth once daily 2)  Meloxicam 15 Mg Tabs (Meloxicam) .Marland Kitchen.. 1 tab by mouth once a day with food 3)  Flexeril 10 Mg Tabs (Cyclobenzaprine hcl) .Marland Kitchen.. 1 tab by mouth at bedtime as needed back pain  Patient Instructions: 1)  Start Meloxicam once a day as your anti inflammatory. 2)  Add Flexeril at bedtime as your muscle relaxer. 3)  OK to use Lidoderm patches, heating pad and gentle stretching. 4)  If back pain not improving in 10 days, call for PT referral. 5)  For L foot pain, wear supportive athletic shoes. Prescriptions: FLEXERIL 10 MG TABS (CYCLOBENZAPRINE HCL) 1 tab by mouth at bedtime as needed back pain  #24 x 0   Entered and Authorized by:   Seymour Bars DO   Signed by:   Seymour Bars DO on 04/04/2010   Method used:   Electronically to        [facility]* (retail)       [address] [place], [place]  [address]       Ph: [phone]       Fax: [phone]   RxID:   [id number] MELOXICAM 15 MG TABS (MELOXICAM) 1 tab by mouth once a day with food  #24 x 0   Entered and Authorized by:   Seymour Bars DO   Signed by:   Seymour Bars DO on 04/04/2010   Method used:   Electronically to        [facility]* (retail)       [address] [place], [place]  [address]       Ph: [phone]       Fax: [phone]   RxID:   [id number]

## 2010-12-01 NOTE — Miscellaneous (Signed)
Summary: Orders Update pft charges  Clinical Lists Changes  Orders: Added new Service order of Carbon Monoxide diffusing w/capacity (94720) - Signed Added new Service order of Lung Volumes (94240) - Signed Added new Service order of Spirometry (Pre & Post) (94060) - Signed 

## 2010-12-01 NOTE — Assessment & Plan Note (Signed)
Summary: SOB & COUGH/KH   Vital Signs:  Patient Profile:   60 Years Old Female CC:      body aches, nausea, SOB X 1 week Height:     65.5 inches Weight:      295 pounds O2 Sat:      97 % O2 treatment:    Room Air Temp:     97.7 degrees F oral Pulse rate:   97 / minute Resp:     22 per minute BP sitting:   135 / 83  (right arm) Cuff size:   large  Pt. in pain?   no  Vitals Entered By: Lajean Saver RN (May 19, 2010 8:16 AM)                   Updated Prior Medication List: TEKTURNA HCT 300-12.5 MG TABS (ALISKIREN-HYDROCHLOROTHIAZIDE) Take 1 tab by mouth once daily MELOXICAM 15 MG TABS (MELOXICAM) 1 tab by mouth once a day with food FLEXERIL 10 MG TABS (CYCLOBENZAPRINE HCL) 1 tab by mouth at bedtime as needed back pain  Current Allergies (reviewed today): ! ACETAMINOPHEN (ACETAMINOPHEN) ! E.E.S. 400 (ERYTHROMYCIN ETHYLSUCCINATE) ! MORPHINE SULFATE CR (MORPHINE SULFATE) ! SULFA ! * HYZAAR ! DIOVANHistory of Present Illness Chief Complaint: body aches, nausea, SOB X 1 week History of Present Illness:  Subjective:  Patient complains of a mild increase in cough for about a week, and mild shortness of breath with activity.  She denies shortness of breath at rest, and none at night.  Her cough does not awaken her.  Her cough has been slightly productive with slight color change of sputum.  No wheezing.  No chest pain with cough.   She has had intermittent sweats but no fever.  She has noted arthralgias but no myalgias.  No other respiratory symptoms such as sore throat or nasal congestion.  She has noticed increased swelling in her legs for about two weeks, not present upon awakening however.  No indigestion or heartburn.  She states that she has been drinking more commercially packaged tea during the hot weather of the past several weeks.  REVIEW OF SYSTEMS Constitutional Symptoms       Complains of night sweats and fatigue.     Denies fever, chills, weight loss, and weight  gain.  Eyes       Denies change in vision, eye pain, eye discharge, glasses, contact lenses, and eye surgery. Ear/Nose/Throat/Mouth       Denies hearing loss/aids, change in hearing, ear pain, ear discharge, dizziness, frequent runny nose, frequent nose bleeds, sinus problems, sore throat, hoarseness, and tooth pain or bleeding.  Respiratory       Complains of productive cough.      Denies dry cough, wheezing, shortness of breath, asthma, bronchitis, and emphysema/COPD.      Comments: in the AM Cardiovascular       Denies murmurs, chest pain, and tires easily with exhertion.    Gastrointestinal       Complains of nausea/vomiting.      Denies stomach pain, diarrhea, constipation, blood in bowel movements, and indigestion.      Comments: denies vomiting Genitourniary       Denies painful urination, kidney stones, and loss of urinary control. Neurological       Denies paralysis, seizures, and fainting/blackouts. Musculoskeletal       Complains of muscle pain and joint pain.      Denies joint stiffness, decreased range of motion, redness, swelling, muscle weakness, and  gout.      Comments: body aches, pain in left scapula Skin       Denies bruising, unusual mles/lumps or sores, and hair/skin or nail changes.  Psych       Denies mood changes, temper/anger issues, anxiety/stress, speech problems, depression, and sleep problems. Other Comments: major c/o body aches   Past History:  Past Medical History: ARM NUMBNESS (ICD-782.0) PALPITATIONS (ICD-785.1) ORTHOSTATIC DIZZINESS (ICD-780.4) PAIN IN JOINT, MULTIPLE SITES (ICD-719.49) OBSTRUCTIVE SLEEP APNEA (ICD-327.23) CANDIDIASIS, SKIN/NAILS (ICD-112.3) HYPERTENSION, BENIGN SYSTEMIC (ICD-401.1)     - Rec d/c ACE October 13, 2009 for pseudoasthma >  dramatic improvement  ASTHMA NOS W/ACUTE EXACERBATION ([id number]-096.04)     - PFT's wnl November 24, 2009 during ? uri cough DEPRESSION, MAJOR, RECURRENT (ICD-296.30) Hx of proteinuria -See Dr.  Briant Cedar Cardiolyte stress test neg 05-19-08 at [facility] degenerative spine  Past Surgical History: Reviewed history from 07/11/2007 and no changes required. Bladder surgery  1995, c/sec and tubal ligation 1979 Hysterectomy  1999, Neck sx for ruptured disc C4-5  2001 Tonsillectomy  age 19 Dr.Tim Davis-cholecystectomy 3/08 Knee replacment in 2008  Family History: Reviewed history from 10/12/2009 and no changes required. 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  Never smoked, no EtOH, no drugs, no caffeine, swims occ for exercise.   Objective:  Obese middle aged female, alert and oriented, and in no distress Eyes:  Pupils are equal, round, and reactive to light and accomdation.  Extraocular movement is intact.  Conjunctivae are not inflamed.  Pharynx:  Normal  Neck:  Supple.  No adenopathy is present.  No thyromegaly is present  Lungs:  Clear to auscultation.  Breath sounds are equal.  Heart:  Regular rate and rhythm without murmurs, rubs, or gallops.  Abdomen:  Nontender without masses or hepatosplenomegaly.  Bowel sounds are present.  No CVA or flank tenderness.  Extremities:  Trace non-pitting  edema.  Pedal pulses are full and equal.  No joint tenderness CBC:  WBC 9.6, Hgb 13.0 Assessment PATIENT HAS GAINED 7 POIUNDS SINCE HER PREVIOUS VISIT WITH PCP.  SUSPECT FLUID RETENTION AS CONTRIBUTING FACTOR TO HER INCREASED COUGH  Plan New Orders: CBC w/Diff [[id number]] T-Sed Rate (Automated) [[id number]] Est. Patient Level IV [86578] Planning Comments:   Recommend that patient avoid commercially prepared drinks that contain preservatives.  Continue hydration with water.   Minimize table salt.  Resume Sybicort. Will check sed rate because of her complaint of arthralgias. Follow-up with PCP if cough persists or becomes worse over the next two weeks.   The patient and/or caregiver has been counseled thoroughly with regard to medications prescribed including dosage, schedule, interactions, rationale for use, and possible side effects and they verbalize understanding.  Diagnoses and expected course of recovery discussed and will return if not improved as expected or if the condition worsens. Patient and/or caregiver verbalized understanding.   Orders Added: 1)  CBC w/Diff [[id number]] 2)  T-Sed Rate (Automated) [[id number]] 3)  Est. Patient Level IV [25366]

## 2010-12-01 NOTE — Assessment & Plan Note (Signed)
Summary: Pulmonary/ final f/u ov   Copy to:  [facility] Primary Provider/Referring Provider:  Nani Gasser MD  CC:  1 month followup.  Pt c/o prod cough in the am for the past 4-5 days.  States that sputum is yellow/clear. She states that her breathing is much better.Alison Richards  History of Present Illness: 4 yowf never smoker with ? 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  October 12, 2009 cc variable sob so bad can't go from room to next without x 2-3 years better after inhaler but one week ago even that didn't work and went to ER x2  rx with neb and prednisone and some better today but still sob with use of voice or any exertion.  Also sense of choking during spells. no gag or vomit. stop advair and lisinopril Avapro 300 mg one daily Use Symbicort 80  2 puffs first thing  in am and 2 puffs again in pm about 12 hours later if needed (not 100%)  November 24, 2009 1 month followup.  Pt c/o prod cough in the am for the past 4-5 days.  States that sputum is yellow/clear. She states that her breathing is much better and not requiring any symbicort or proaire x 3-4 days.  Pt denies any significant sore throat, dysphagia, itching, sneezing,  nasal congestion or excess secretions,  fever, chills, sweats, unintended wt loss, pleuritic or exertional cp, hempoptysis, change in activity tolerance  orthopnea pnd or leg swelling. Pt also denies any obvious fluctuation in symptoms with weather or environmental change or other alleviating or aggravating factors.       Current Medications (verified): 1)  Zoloft 25 Mg Tabs (Sertraline Hcl) .... Once Daily By Mouth 2)  Proair Hfa 108 (90 Base) Mcg/act Aers (Albuterol Sulfate) .... 2-4 Puffs Inhaled  Every 4-6 Hours As Needed Sob 3)  Benzonatate 200 Mg Caps (Benzonatate) .... One By Mouth Three Times A Day As Needed For Cough 4)  Symbicort 80-4.5 Mcg/act  Aero (Budesonide-Formoterol Fumarate) .... 2 Puffs First Thing  in Am  and 2 Puffs Again in Pm About 12 Hours Later If Needed 5)  Avalide 300-12.5 Mg Tabs (Irbesartan-Hydrochlorothiazide) .Alison Richards.. 1 Once Daily  Allergies (verified): 1)  ! Acetaminophen (Acetaminophen) 2)  ! E.e.s. 400 (Erythromycin Ethylsuccinate) 3)  ! Morphine Sulfate Cr (Morphine Sulfate) 4)  ! Sulfa 5)  ! * Hyzaar 6)  ! Diovan  Past History:  Past Medical History: ARM NUMBNESS (ICD-782.0) PALPITATIONS (ICD-785.1) ORTHOSTATIC DIZZINESS (ICD-780.4) PAIN IN JOINT, MULTIPLE SITES (ICD-719.49) FREQUENCY, URINARY (ICD-788.41) FINGER SPRAIN (ICD-842.10) OBSTRUCTIVE SLEEP APNEA (ICD-327.23) SNORING (ICD-786.09) INSECT BITE (ICD-919.4) CHEST PAIN, LEFT (ICD-786.50) SINUSITIS, ACUTE (ICD-461.9) CANDIDIASIS, SKIN/NAILS (ICD-112.3) HYPERTENSION, BENIGN SYSTEMIC (ICD-401.1)     - Rec d/c ACE October 13, 2009 for pseudoasthma >  dramatic improvement  ASTHMA NOS W/ACUTE EXACERBATION [phone])     - PFT's wnl November 24, 2009 during ? uri cough DEPRESSION, MAJOR, RECURRENT (ICD-296.30)   Hx of proteinuria -See Dr. Briant Cedar Cardiolyte stress test neg 05-19-08 at [facility]  Vital Signs:  Patient profile:   60 year old female Weight:      285 pounds BMI:     46.87 O2 Sat:      95 % on Room air Temp:     97.6 degrees F oral Pulse rate:   81 / minute BP sitting:   110 / 72  (left arm) Cuff size:   large  Vitals Entered By: Vernie Murders (  November 24, 2009 11:28 AM)  O2 Flow:  Room air  Physical Exam  Additional Exam:  twt 288 > 286 October 12, 2009  > 285 November 24, 2009  amb wf with  no longer  Voice fatigue and pseudowheeze  HEENT: nl dentition, turbinates, and orophanx. Nl external ear canals without cough reflex Neck without JVD/Nodes/TM Lungs clear to A and P bilaterally without cough on insp or exp maneuvers RRR no s3 or murmur or increase in P2 Abd soft and benign with nl excursion in the supine position. No bruits or organomegaly Ext warm without calf  tenderness, cyanosis clubbing or edema     Impression & Recommendations:  Problem # 1:  ASTHMA NOS W/ACUTE EXACERBATION (ICD-493.92) Not clear at all she has asthma, more likely this is Upper airway cough syndrome, so named because it's frequently impossible to sort out how much is LPR vs CR/sinusitis with freq throat clearing generating secondary extra esophageal GERD from wide swings in gastric pressure that occur with throat clearing, promoting self use of mint and menthol lozenges that reduce the lower esophageal sphincter tone and exacerbate the problem further.  These symptoms are easily confused with asthma/copd by even experienced pulmonogists because they overlap so much. These are the same pts who not infrequently have failed to tolerate ace inhibitors,  dry powder inhalers or biphosphonates or report having reflux symptoms that don't respond to standard doses of PPI  See instructions for specific recommendations   Problem # 2:  HYPERTENSION, BENIGN SYSTEMIC (ICD-401.1)  The following medications were removed from the medication list:    Hydrochlorothiazide 25 Mg Tabs (Hydrochlorothiazide) .Alison Richards... 1/2 once daily    Avapro 300 Mg Tabs (Irbesartan) ..... One tablet by mouth daily Her updated medication list for this problem includes:    Avalide 300-12.5 Mg Tabs (Irbesartan-hydrochlorothiazide) .Alison Richards... 1 once daily (note couldn't tolerate cozaar or diovan but fine on avalide)  ACE inhibitors are problematic in  pts with airway complaints because  even experienced pulmononlogists can't always distinguish ace effects from copd/asthma.  By themselves they don't actually cause a problem, much like oxygen can't by itself start a fire, but they certainly serve as a powerful catalyst or enhancer for any "fire"  or inflammatory process in the upper airway, be it caused by an ET  tube or more commonly reflux (especially in the obese or pts with known GERD or who are on biphoshonates) or even the common  cold.   In the era of ARB near equivalency until we have a better handle on the reversibility of the airway problem, it just makes sense to avoid ace entirely in the short run and then decide later, having established a level of airway control usuing a reasonable limited regimen, whether to add back ace abut even then being very careful to observe the pt for  airway control and number of meds used/ needed to control symptoms.   Orders: Est. Patient Level IV (81191)  Medications Added to Medication List This Visit: 1)  Avalide 300-12.5 Mg Tabs (Irbesartan-hydrochlorothiazide) .Alison Richards.. 1 once daily  Patient Instructions: 1)  GERD (REFLUX)  is a common cause of respiratory symptoms. It commonly presents without heartburn and can be treated with medication, but also with lifestyle changes including avoidance of late meals, excessive alcohol, smoking cessation, and avoid fatty foods, chocolate, peppermint, colas, red wine, and acidic juices such as orange juice. NO MINT OR MENTHOL PRODUCTS SO NO COUGH DROPS  2)  USE SUGARLESS CANDY INSTEAD (jolley ranchers)  3)  NO OIL BASED VITAMINS  4)  Any wheezing that symbicort doesn't knock out raises the issue cid reflux is the leading suspect here and needs to be eliminated  completely before considering additional studies or treatment options. To suppress this maximally, take Prilosec before breakfastl and pepcid 20 mg (otc) at bedtime plus diet measures as listed.  5)  ACE inhbitors or Beta blockers (except for bystolic/bisoprolol) should be avoided  Prescriptions: SYMBICORT 80-4.5 MCG/ACT  AERO (BUDESONIDE-FORMOTEROL FUMARATE) 2 puffs first thing  in am and 2 puffs again in pm about 12 hours later if needed  #1 x 11   Entered and Authorized by:   Nyoka Cowden MD   Signed by:   Nyoka Cowden MD on 11/24/2009   Method used:   Electronically to        [facility]* (retail)       [address] [place], [place]  [address]       Ph: [phone]        Fax: [phone]   RxID:   [id number] AVALIDE 300-12.5 MG TABS (IRBESARTAN-HYDROCHLOROTHIAZIDE) 1 once daily  #30 x 11   Entered and Authorized by:   Nyoka Cowden MD   Signed by:   Nyoka Cowden MD on 11/24/2009   Method used:   Electronically to        [facility]* (retail)       [address] [place], [place]  [address]       Ph: [phone]       Fax: [phone]   RxID:   [id number]

## 2010-12-01 NOTE — Letter (Signed)
Summary: Fax from Patient Regarding Zoloft  Fax from Patient Regarding Zoloft   Imported By: Lanelle Bal 12/24/2009 07:57:00  _____________________________________________________________________  External Attachment:    Type:   Image     Comment:   External Document

## 2010-12-01 NOTE — Letter (Signed)
Summary: Out of Work  [facility]  [address]   [place], [place] [address]   Phone: [phone]  Fax: [phone]    May 19, 2010   Employee:  [name] [facility]    To Whom It May Concern:   Alison Richards was evaluated in our office this morning.  If you need additional information, please feel free to contact our office.         Sincerely,    Donna Christen MD

## 2010-12-01 NOTE — Letter (Signed)
Summary: Out of Work  [facility]  [address]   [place], [place] [address]   Phone: [phone]  Fax: [phone]    November 03, 2010   Employee:  Alison Richards [facility]    To Whom It May Concern:   For Medical reasons, please excuse the above named employee from work tomorrow.    If you need additional information, please feel free to contact our office.         Sincerely,    Donna Christen MD

## 2010-12-05 ENCOUNTER — Ambulatory Visit
Admission: RE | Admit: 2010-12-05 | Discharge: 2010-12-05 | Disposition: A | Discharge status: Home / Self Care | Payer: BC Managed Care – PPO | Source: Ambulatory Visit | Attending: Emergency Medicine | Admitting: Emergency Medicine

## 2010-12-05 ENCOUNTER — Ambulatory Visit (INDEPENDENT_AMBULATORY_CARE_PROVIDER_SITE_OTHER): Payer: BC Managed Care – PPO | Admitting: Emergency Medicine

## 2010-12-05 ENCOUNTER — Encounter: Payer: Self-pay | Admitting: Emergency Medicine

## 2010-12-05 ENCOUNTER — Other Ambulatory Visit: Payer: Self-pay | Admitting: Emergency Medicine

## 2010-12-05 DIAGNOSIS — M25559 Pain in unspecified hip: Secondary | ICD-10-CM

## 2010-12-06 ENCOUNTER — Encounter: Payer: Self-pay | Admitting: Family Medicine

## 2010-12-15 NOTE — Assessment & Plan Note (Signed)
Summary: COUGH,SINUS PRESSURE, SORE THROAT/WSE (rm 3)   Vital Signs:  Patient Profile:   60 Years Old Female CC:      pain in left thigh and left leg, congestion Height:     65.5 inches Weight:      257 pounds O2 Sat:      96 % O2 treatment:    Room Air Temp:     99.0 degrees F oral Pulse rate:   72 / minute Resp:     20 per minute BP sitting:   151 / 74  (left arm) Cuff size:   large  Vitals Entered By: Lajean Saver RN (December 05, 2010 6:32 PM)                  Updated Prior Medication List: TEKTURNA HCT 300-12.5 MG TABS (ALISKIREN-HYDROCHLOROTHIAZIDE) Take 1 tab by mouth once daily  Current Allergies (reviewed today): ! ACETAMINOPHEN (ACETAMINOPHEN) ! E.E.S. 400 (ERYTHROMYCIN ETHYLSUCCINATE) ! MORPHINE SULFATE CR (MORPHINE SULFATE) ! SULFA ! * HYZAAR ! DIOVANHistory of Present Illness History from: patient Chief Complaint: pain in left thigh and left leg, congestion History of Present Illness: Pt of Dr Eppie Gibson. Numerous complaints. She was unable to see her PCP today. The cc we addresed today: L thigh and L hip pain.  Onset: ? 2 months ago, much worse past few days. Description/Quality of Pain: sore, burning, sharp at times. Intensity of pain: 5/10 Modifying Factors: worse to move or go up steps Trauma: None In the past tx'd by Dr Glee Arvin for spinal pain.  REVIEW OF SYSTEMS Constitutional Symptoms      Denies fever, chills, night sweats, weight loss, weight gain, and fatigue.  Eyes       Denies change in vision, eye pain, eye discharge, glasses, contact lenses, and eye surgery. Ear/Nose/Throat/Mouth       Complains of frequent runny nose, sinus problems, and hoarseness.      Denies hearing loss/aids, change in hearing, ear pain, ear discharge, dizziness, frequent nose bleeds, sore throat, and tooth pain or bleeding.      Comments: "scratchy" throat Respiratory       Denies dry cough, productive cough, wheezing, shortness of breath, asthma, bronchitis, and  emphysema/COPD.  Cardiovascular       Denies murmurs, chest pain, and tires easily with exhertion.    Gastrointestinal       Denies stomach pain, nausea/vomiting, diarrhea, constipation, blood in bowel movements, and indigestion. Genitourniary       Denies painful urination, kidney stones, and loss of urinary control. Neurological       Denies paralysis, seizures, and fainting/blackouts. Musculoskeletal       Denies muscle pain, joint pain, joint stiffness, decreased range of motion, redness, swelling, muscle weakness, and gout.  Skin       Denies bruising, unusual mles/lumps or sores, and hair/skin or nail changes.  Psych       Denies mood changes, temper/anger issues, anxiety/stress, speech problems, depression, and sleep problems. Other Comments: Patient recently treated for phlebitis in left leg. Phlebitis has resolved but she c/o pain in her left thigh and left lower leg. Mild redness and swelling present. She was unable to see her PCP today. She also developed a "scratchy" throat today, runny nose, and hoarseness.   Past History:  Past Medical History: Reviewed history from 05/19/2010 and no changes required. ARM NUMBNESS (ICD-782.0) PALPITATIONS (ICD-785.1) ORTHOSTATIC DIZZINESS (ICD-780.4) PAIN IN JOINT, MULTIPLE SITES (ICD-719.49) OBSTRUCTIVE SLEEP APNEA (ICD-327.23) CANDIDIASIS, SKIN/NAILS (ICD-112.3) HYPERTENSION, BENIGN  SYSTEMIC (ICD-401.1)     - Rec d/c ACE October 13, 2009 for pseudoasthma >  dramatic improvement  ASTHMA NOS W/ACUTE EXACERBATION [phone])     - PFT's wnl November 24, 2009 during ? uri cough DEPRESSION, MAJOR, RECURRENT (ICD-296.30) Hx of proteinuria -See Dr. Briant Cedar Cardiolyte stress test neg 05-19-08 at [facility] spine  Past Surgical History: Bladder surgery  1995, c/sec and tubal ligation 1979 Hysterectomy  1999, Neck sx for ruptured disc C4-5  2001 Tonsillectomy  age 13 Dr.Tim Davis-cholecystectomy 3/08 Left Knee  replacment in 2008  Family History: Reviewed history from 10/12/2009 and no changes required. 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  Never smoked, no EtOH, no drugs, no caffeine, swims occ for exercise. Physical Exam General appearance: obese, well developed, well nourished, no acute distress. Uncomfortable when walk or changes position. Extremities: very tender diffusely L thigh and L hip. Decreased ROM, limited by pain.  Neurological: grossly intact and non-focal. DTR's symmetr.bilat.---Motor, sensory intact bilat. SLR and Patrick's tests: equiv. mild + on Left.  Peripheral pulses intact bilat.   +1 pedal edema bilat. No cords or heat. Neg Homans sign. Assessment New Problems: HIP PAIN, LEFT (ICD-719.45)  XRay R hip: Mild degen. changes. --No dislocation or fracture.  Plan New Orders: T-DG Hip Complete*L* [73510] Est. Patient Level IV [19147] Planning Comments:   Rx options discussed. Risks, benefits, alternatives discussed. Pt voiced understanding and agreement. --Pt declined any rx. She prefers to try otc ibuprofen in rx dose of 600 mg three times a day PC. Precautions discussed.  Follow Up: Dr. Eppie Gibson in 3-4 days.---Also advised to f/u with Dr. Glee Arvin within 1 week.  The patient and/or caregiver has been counseled thoroughly with regard to medications prescribed including dosage, schedule, interactions, rationale for use, and possible side effects and they verbalize understanding.  Diagnoses and expected course of recovery discussed and will return if not improved as expected or if the condition worsens. Patient and/or caregiver verbalized understanding.   Orders Added: 1)  T-DG  Hip Complete*L* [73510] 2)  Est. Patient Level IV [82956]

## 2010-12-29 ENCOUNTER — Encounter: Payer: Self-pay | Admitting: Family Medicine

## 2011-01-02 ENCOUNTER — Other Ambulatory Visit: Payer: Self-pay | Admitting: Orthopedic Surgery

## 2011-01-02 ENCOUNTER — Ambulatory Visit (INDEPENDENT_AMBULATORY_CARE_PROVIDER_SITE_OTHER): Payer: BC Managed Care – PPO | Admitting: Family Medicine

## 2011-01-02 ENCOUNTER — Encounter: Payer: Self-pay | Admitting: Family Medicine

## 2011-01-02 DIAGNOSIS — M25552 Pain in left hip: Secondary | ICD-10-CM

## 2011-01-02 DIAGNOSIS — L259 Unspecified contact dermatitis, unspecified cause: Secondary | ICD-10-CM

## 2011-01-02 DIAGNOSIS — I10 Essential (primary) hypertension: Secondary | ICD-10-CM

## 2011-01-03 ENCOUNTER — Encounter: Payer: Self-pay | Admitting: Family Medicine

## 2011-01-03 ENCOUNTER — Ambulatory Visit
Admission: RE | Admit: 2011-01-03 | Discharge: 2011-01-03 | Disposition: A | Discharge status: Home / Self Care | Payer: BC Managed Care – PPO | Source: Ambulatory Visit | Attending: Orthopedic Surgery | Admitting: Orthopedic Surgery

## 2011-01-03 DIAGNOSIS — M25552 Pain in left hip: Secondary | ICD-10-CM

## 2011-01-10 NOTE — Assessment & Plan Note (Signed)
Summary: Eczema, HTN   Vital Signs:  Patient profile:   60 year old female Height:      65.5 inches Weight:      276 pounds BMI:     45.39 O2 Sat:      97 % on Room air Pulse rate:   101 / minute BP sitting:   138 / 72  (left arm) Cuff size:   large  Vitals Entered By: Payton Spark CMA (January 02, 2011 9:57 AM)  O2 Flow:  Room air CC: Saw Dr. Charlann Boxer for hip and foot and now on prednisone for 12 days. Here today for dry red patches on face.    Primary Care Provider:  Nani Gasser MD  CC:  Saw Dr. Charlann Boxer for hip and foot and now on prednisone for 12 days. Here today for dry red patches on face. Marland Kitchen  History of Present Illness: Saw Dr. Charlann Boxer for hip and foot and now on prednisone for 12 days. She is schedule for an MRI.  Xray shows only mild arthritis.  They are looking or necrosis on the MRI. Left and footfeel like they are on fire and goes numb.  If sits or stands for too long it is worse.    Here today for dry red patches on face. Today it lookds . Having dry patches and then rashed rash.  Has been using lotions. Does have kids with eczema.  Normall has oily skin.  Startedon the crease on her motuh.  Has been going on for a coupleof months. Has had a cough recently for a week. No fever orother cold sxs.  Has gained 19 lbs in about 2 months. Says she has been eating very poorly and knows she needs to do better. Marland Kitchen  Has been trying different lotions and nothing has really helped.   Allergies: 1)  ! Acetaminophen (Acetaminophen) 2)  ! E.e.s. 400 (Erythromycin Ethylsuccinate) 3)  ! Morphine Sulfate Cr (Morphine Sulfate) 4)  ! Sulfa 5)  ! * Hyzaar 6)  ! Diovan  Physical Exam  General:  Well-developed,well-nourished,in no acute distress; alert,appropriate and cooperative throughout examination Lungs:  Normal respiratory effort, chest expands symmetrically. Lungs are clear to auscultation, no crackles or wheezes. Heart:  Normal rate and regular rhythm. S1 and S2 normal without  gallop, murmur, click, rub or other extra sounds. Skin:  Does hve some dry patches on her cheeks and temples and a appor 1 cm mildly erythematou papuls over the left eye.    Impression & Recommendations:  Problem # 1:  ECZEMA (ICD-692.9) I don't think this si a medication reaction. I really wonder if she is reacting to somthing she is putting on her face. Use cetaphile soap and eucering moisturized and can use low dose topical steroid for no more than 2 weeks on the face.  I don't think this is rosacea.  consider alos seb derm.      Her updated medication list for this problem includes:    Triamcinolone Acetonide 0.025 % Crea (Triamcinolone acetonide) .Marland Kitchen... Apply to face at bedtime. avoid eye area. don't use for more than 2 week.  Problem # 2:  HYPERTENSION, BENIGN SYSTEMIC (ICD-401.1) Looks great today. Discussed the importance of weight loss  Her updated medication list for this problem includes:    Tekturna Hct 300-12.5 Mg Tabs (Aliskiren-hydrochlorothiazide) .Marland Kitchen... Take 1 tab by mouth once daily  BP today: 138/72 Prior BP: 151/74 (12/05/2010)  Prior 10 Yr Risk Heart Disease: 6 % (01/06/2009)  Labs Reviewed:  K+: 4.0 (03/09/2009) Creat: : 0.86 (03/09/2009)   Chol: 176 (05/12/2008)   HDL: 53 (05/12/2008)   LDL: 96 (05/12/2008)   TG: 136 (05/12/2008)  Complete Medication List: 1)  Tekturna Hct 300-12.5 Mg Tabs (Aliskiren-hydrochlorothiazide) .... Take 1 tab by mouth once daily 2)  Triamcinolone Acetonide 0.025 % Crea (Triamcinolone acetonide) .... Apply to face at bedtime. avoid eye area. don't use for more than 2 week.  Patient Instructions: 1)  Eucerin cream on the face two times a day and use the steroid cream at bedtime and then put the cream on top.  2)  PHisoderm or cetaphil  soap at bedtime.   Prescriptions: TRIAMCINOLONE ACETONIDE 0.025 % CREA (TRIAMCINOLONE ACETONIDE) Apply to face at bedtime. Avoid eye area. Don't use for more than 2 week.  #20 gram. x 0   Entered and  Authorized by:   Nani Gasser MD   Signed by:   Nani Gasser MD on 01/02/2011   Method used:   Electronically to        [facility]* (retail)       [address] [place], [place]  [address]       Ph: [phone]       Fax: [phone]   RxID:   [id number]    Orders Added: 1)  Est. Patient Level III [84132]

## 2011-01-25 ENCOUNTER — Ambulatory Visit: Payer: Self-pay | Admitting: Family Medicine

## 2011-01-30 LAB — CBC
HCT: 39.6 % (ref 36.0–46.0)
MCHC: 34.3 g/dL (ref 30.0–36.0)
MCV: 89.8 fL (ref 78.0–100.0)
Platelets: 252 10*3/uL (ref 150–400)
Platelets: 283 10*3/uL (ref 150–400)
RDW: 13.5 % (ref 11.5–15.5)
WBC: 9.7 10*3/uL (ref 4.0–10.5)
WBC: 13.4 10*3/uL — ABNORMAL HIGH (ref 4.0–10.5)

## 2011-01-30 LAB — URINALYSIS, ROUTINE W REFLEX MICROSCOPIC
Glucose, UA: NEGATIVE mg/dL
Leukocytes, UA: NEGATIVE
Nitrite: NEGATIVE
Specific Gravity, Urine: 1.028 (ref 1.005–1.030)
pH: 5.5 (ref 5.0–8.0)

## 2011-01-30 LAB — URINE MICROSCOPIC-ADD ON

## 2011-01-30 LAB — BASIC METABOLIC PANEL
BUN: 16 mg/dL (ref 6–23)
CO2: 30 mEq/L (ref 19–32)
Calcium: 9.1 mg/dL (ref 8.4–10.5)
Creatinine, Ser: 0.82 mg/dL (ref 0.4–1.2)
Glucose, Bld: 86 mg/dL (ref 70–99)

## 2011-01-30 LAB — DIFFERENTIAL
Lymphocytes Relative: 31 % (ref 12–46)
Lymphs Abs: 4.2 10*3/uL — ABNORMAL HIGH (ref 0.7–4.0)
Neutro Abs: 7.8 10*3/uL — ABNORMAL HIGH (ref 1.7–7.7)
Neutrophils Relative %: 59 % (ref 43–77)

## 2011-01-30 LAB — LIPASE, BLOOD: Lipase: 223 U/L (ref 23–300)

## 2011-01-30 LAB — COMPREHENSIVE METABOLIC PANEL
ALT: 25 U/L (ref 0–35)
CO2: 25 mEq/L (ref 19–32)
Calcium: 8.4 mg/dL (ref 8.4–10.5)
Chloride: 106 mEq/L (ref 96–112)
Creatinine, Ser: 1 mg/dL (ref 0.4–1.2)
GFR calc non Af Amer: 57 mL/min — ABNORMAL LOW (ref >60)
Glucose, Bld: 141 mg/dL — ABNORMAL HIGH (ref 70–99)
Total Bilirubin: 0.5 mg/dL (ref 0.3–1.2)

## 2011-01-31 LAB — BASIC METABOLIC PANEL WITH GFR
BUN: 14 mg/dL (ref 6–23)
CO2: 28 meq/L (ref 19–32)
Calcium: 9.4 mg/dL (ref 8.4–10.5)
Chloride: 106 meq/L (ref 96–112)
Creatinine, Ser: 0.8 mg/dL (ref 0.4–1.2)
GFR calc non Af Amer: >60 mL/min
Glucose, Bld: 87 mg/dL (ref 70–99)
Potassium: 4.2 meq/L (ref 3.5–5.1)
Sodium: 144 meq/L (ref 135–145)

## 2011-01-31 LAB — DIFFERENTIAL
Basophils Absolute: 0 10*3/uL (ref 0.0–0.1)
Basophils Relative: 0 % (ref 0–1)
Eosinophils Absolute: 0 10*3/uL (ref 0.0–0.7)
Eosinophils Relative: 0 % (ref 0–5)
Lymphocytes Relative: 21 % (ref 12–46)
Lymphs Abs: 2.6 10*3/uL (ref 0.7–4.0)
Monocytes Absolute: 0.3 10*3/uL (ref 0.1–1.0)
Monocytes Relative: 2 % — ABNORMAL LOW (ref 3–12)
Neutro Abs: 9.8 10*3/uL — ABNORMAL HIGH (ref 1.7–7.7)
Neutrophils Relative %: 77 % (ref 43–77)

## 2011-01-31 LAB — CBC
HCT: 36.2 % (ref 36.0–46.0)
Platelets: 291 10*3/uL (ref 150–400)
RDW: 12.9 % (ref 11.5–15.5)
WBC: 12.7 10*3/uL — ABNORMAL HIGH (ref 4.0–10.5)

## 2011-01-31 LAB — POCT CARDIAC MARKERS
CKMB, poc: 1.9 ng/mL (ref 1.0–8.0)
Myoglobin, poc: 113 ng/mL (ref 12–200)
Troponin i, poc: <0.05 ng/mL (ref 0.00–0.09)

## 2011-01-31 LAB — POCT B-TYPE NATRIURETIC PEPTIDE (BNP): B Natriuretic Peptide, POC: 72.5 pg/mL (ref 0–100)

## 2011-02-04 LAB — BASIC METABOLIC PANEL
BUN: 16 mg/dL (ref 6–23)
Calcium: 9.5 mg/dL (ref 8.4–10.5)
Creatinine, Ser: 0.77 mg/dL (ref 0.4–1.2)
GFR calc non Af Amer: >60 mL/min (ref >60)
Glucose, Bld: 91 mg/dL (ref 70–99)

## 2011-02-04 LAB — CBC
HCT: 37.9 % (ref 36.0–46.0)
Platelets: 301 10*3/uL (ref 150–400)
RDW: 13.3 % (ref 11.5–15.5)
WBC: 10.9 10*3/uL — ABNORMAL HIGH (ref 4.0–10.5)

## 2011-03-14 NOTE — Op Note (Signed)
NAMERINI, MOFFIT           ACCOUNT NO.:  [id number]   MEDICAL RECORD NO.:  [id number]          PATIENT TYPE:  AMB   LOCATION:  SDS                          FACILITY:  [facility]   PHYSICIAN:  Donalee Citrin, M.D.        DATE OF BIRTH:  Oct 06, 1951   DATE OF PROCEDURE:  06/28/2009  DATE OF DISCHARGE:  06/28/2009                               OPERATIVE REPORT   PREOPERATIVE DIAGNOSIS:  Left carpal tunnel syndrome.   PROCEDURE:  Left carpal tunnel release.   SURGEON:  Donalee Citrin, MD   ANESTHESIA:  General with local.   HISTORY OF PRESENT ILLNESS:  The patient is a very pleasant 60 year old  female who has had bilateral hand pain, numbness, tingling first 2  fingers of both hands.  EMG showed severe median nerve entrapment of the  wrist.  This patient was recommended carpal tunnel release due to the  failure of conservative treatment of left hand.  Risks and benefits of  the operation were explained to the patient, and understands and agreed  to proceed forward.   DESCRIPTION OF PROCEDURE:  The patient was brought to the OR and was  induced under LMA general anesthesia.  Left arm and hand was prepped and  draped in the usual sterile fashion.  An incision was drawn out from the  disk crease of the wrist along the palmar crease towards the middle  finger approximately 4-5 cm in length.  This was then incised with a #15  blade scalpel and the subcutaneous tissue and fat was dissected free.  Transverse carpal ligament was identified and sequentially incised  carefully in layers with a #15 blade scalpel until the median nerve,  epineurium was visualized.  Using a mosquito hemostat, the plane between  the transverse carpal ligament and the epineurium of the median nerve  was developed and incised sharply both proximally and distally until the  hemostats were easily passed along the pathway of the median nerve  without resistance.  The ligaments were markedly hypertrophied causing  severe  entrapment, but at the end of the release, there was no further  stenosis and no further compression.  The wound was copiously irrigated,  meticulous hemostasis was maintained, and the skin was closed with  interrupted vertical mattress.  The hand was then wrapped.  The patient  went to the recovery room in stable condition.  At the end of the case,  all sponge, needle, and instrument counts were correct.           ______________________________  Donalee Citrin, M.D.     GC/MEDQ  D:  06/28/2009  T:  06/29/2009  Job:  [id number]

## 2011-03-17 NOTE — Op Note (Signed)
NAMEROSSETTA, KAMA           ACCOUNT NO.:  [id number]   MEDICAL RECORD NO.:  [id number]          PATIENT TYPE:  INP   LOCATION:  X006                         [facility]ACILITY:  [facility]   PHYSICIAN:  Madlyn Frankel. Charlann Boxer, M.D.  DATE OF BIRTH:  03/12/1951   DATE OF PROCEDURE:  01/21/2007  DATE OF DISCHARGE:                               OPERATIVE REPORT   PREOPERATIVE DIAGNOSIS:  Left knee end-stage osteoarthritis.   POSTOPERATIVE DIAGNOSIS:  Left knee end-stage osteoarthritis.   PROCEDURE:  Left total knee replacement.   COMPONENTS USED:  DePuy rotating platform knee system with size 3 femur,  2.5 tibial tray,  size 3 x 12.5 rotating platform insert and a 35  patellar button.   SURGEON:  Madlyn Frankel. Charlann Boxer, M.D.   ASSISTANT:  Dwyane Luo, PA   ANESTHESIA:  General plus regional femoral block.   DRAINS:  Times one.   TOURNIQUET TIME:  50 minutes 300 mmHg.   Please note that I did use FloSeal 10 mL in this case.   INDICATIONS:  Ms. Pla is a very pleasant 60 year old female who  presented to the office for evaluation of bilateral knee pain.  Her left  knee clinically worse than her right despite having radiographic changes  for advanced bilateral knee osteoarthritis.  She had failed conservative  measures and wished to proceed with knee replacement.  Her quality life  had significantly diminished.  She had been having problems for years.  Attempts at weight loss were progressing slowly and effort to try to  preserve her knees has now failed.  We reviewed the risks of infection,  dislocation, polyethylene, need for revision surgery, persistent  discomfort postop.  We also reviewed the risks of neurovascular injury  and postoperative course and expectations.  Consent obtained.   PROCEDURE IN DETAIL:  The patient was brought to operative theater.  Once adequate anesthesia and preoperative antibiotics, 2 grams of Ancef,  administered, the patient was positioned supine.  Proximal thigh  tourniquet was placed.  The left lower extremity prepped and draped  after pre scrub in standard fashion.   Midline incision was made followed by modified medial parapatellar  arthrotomy with patella subluxation, no eversion.  The knee exposure  obtained in routine fashion with standard debridements.  Once the knee  was exposed I attended to first the patella.  Patellar debridement  carried out in the infra and suprapatellar region of synovium.  Precut  measure was 22 mm.  I resected down to 15 mm using a 35 patellar button.  The patella shim was then placed for retractor.  At this point the knee  exposure obtained using a lateral retractor.   The femoral canal was opened, irrigated to prevent fat, emboli.  Intramedullary rod was then passed at 4 degrees of valgus due to her  short stature, I cut 10 mm of bone.  The patient noted to have severe  degenerative change on the medial side of the knee.   I then sized the femur to be a size 3, the rotation based on the  posterior condylar axis appeared to be symmetric to that of the  epicondylar axis as well as perpendicular to Whiteside's line.  The  anterior-posterior and chamfer cuts were then made.  There was no  notching.   I went ahead and created a box cut based off the lateral aspect of  distal femur.  At this point attention was directed to the tibia.  There  was no significant osteophytes that needed to be removed on the  posterior aspect of the femur nor medial and lateral following the  debridements as noted.   Tibial exposure obtained including removing the PCL stump of meniscus.  Based off the lateral side 10 mm of bone resected off in a perpendicular  fashion.  Cut surface measured about a 2.5.   I then checked the extension gap and found that it came out to full  extension with at least a 10 mm insert, pins were removed.  Final  preparation with the tibia was carried out with a 25 tray which had  excellent cortical  contact.  The alignment rod was used to check the  orientation and it seemed to be passing through the ankle in a  perpendicular fashion in AP and laterally.   At this point trial reduction was carried out following keel punching  with a 12.5 insert.  I was very happy the knee came out to extension,  was very stable in flexion.  Given this and with a trial patella placed,  the patella tracked without application of any pressure.   All the trial components removed, the knee was copiously irrigated  normal saline solution, and I injected the knee was 60 mL of Marcaine  with epinephrine and 1 mL of Toradol.  Components were brought to the  table and cement mixed.  Components were then cemented in position with  tibia and femur and patella.  Excessive cement debrided.  The knee was  brought to extension to allow cement to cure.  Once the cement had  cured, the tourniquet was let down at 50 minutes of tourniquet time.  10  mL of FloSeal was then placed.  I irrigated the knee out prior to  placing the FloSeal.  The final polyethylene insert was placed, once I  was satisfied there was no visualized loose fragments of cement medial,  lateral or posteriorly.   Following placement of FloSeal, the knee was brought to flexion, the  medium Hemovac drain was placed deep.  I reapproximated extensor  mechanism with a #1 Vicryl, #2-0 Vicryl used in subcu tissue and 4-0  running Monocryl on the skin.  Skin was cleaned, dried, dressed  sterilely with Steri-Strips, dressing sponges and a bulky sterile wrap.  She was brought to recovery room in stable condition.      Madlyn Frankel Charlann Boxer, M.D.  Electronically Signed     MDO/MEDQ  D:  01/21/2007  T:  01/21/2007  Job:  [id number]

## 2011-03-17 NOTE — Discharge Summary (Signed)
NAMEAYN, DOMANGUE           ACCOUNT NO.:  [id number]   MEDICAL RECORD NO.:  [id number]          PATIENT TYPE:  INP   LOCATION:  1504                         FACILITY:  [facility]   PHYSICIAN:  Madlyn Frankel. Charlann Boxer, M.D.  DATE OF BIRTH:  [birth year]   DATE OF ADMISSION:  01/21/2007  DATE OF DISCHARGE:  01/24/2007                               DISCHARGE SUMMARY   ADMISSION DIAGNOSES:  1. Osteoarthritis.  2. Hypertension.  3. Hiatal hernia.  4. Asthma.  5. Migraines.  6. Previous spine surgery in 2002.   DISCHARGE DIAGNOSES:  1. Osteoarthritis.  2. Hypertension.  3. Hiatal hernia.  4. Asthma.  5. Migraines.  6. Spine surgery in 2002.   CONSULTATIONS:  None.   PROCEDURE:  Left total knee replacement.   SURGEON:  Madlyn Frankel. Charlann Boxer, M.D.   ASSISTANT:  Yetta Glassman. Loreta Ave, PA   HISTORY OF PRESENT ILLNESS:  Mrs. Alison Richards is a 60 year old female with  a history of bilateral knee pain and knee osteoarthritis.  The left has  been worse than her right.  She has failed all conservative treatments.  She was cleared presurgically by her PCP, Dr. Carter Kitten.   PREADMISSION LABS:  Hematocrit 39.  Postop day #2, 31.0 hematocrit.  Preadmission coagulation within normal limits.  INR 1.0.  Preadmission  chemistries all within normal limits.  Postop day #1, sodium 131,  glucose 105.  Postop day #2, 135 sodium, 121 glucose.  All other stable.  GFR remained greater than 60 preadmission.  Postop day #1 it was 59,  after which resolved to greater than 60.  Preadmission GI workup showed  all within normal limits.   Discharge UA - cloudy appearance, otherwise negative.   EKG - normal sinus rhythm.   Radiology, chest 2-view, impression:  1. Stable chest.  No active disease.  2. There is a small nodule-like density in the right upper lobe.  It      probably represents a vessel.  However, follow up chest film would      be suggested in 2-3 months.   HOSPITAL COURSE:  The patient underwent left total  knee replacement.  Tolerated the procedure well and was admitted to the orthopedic floor.  She remained neuromuscularly and vascularly intact.  She was able to do  a straight leg raise after postop day #1 and remained so throughout her  course of stay.  Dressing was checked.  Hemovac was pulled on postop day  #1.  PT/OT was again, she was weightbearing as tolerated.  PT/OT  evaluation was performed on postop day #1 and recommended home health  care physical therapy.  The patient ran a slight fever, 101.9, was given  order for ibuprofen.  On postop day #2, minimum pain, dressing was  changed.  Wound had no active drainage.  She continued to have a  straight leg raise.  The plan was for discharge home with home health  care physical therapy on Thursday.  PT/OT - she was weightbearing as  tolerated with a rolling walker.  Again, had a moderate fever of 101 and  was allowed ibuprofen for therapy.  On postop  day #3 was doing well, she  as afebrile.  Her hematocrit had been stable at 31 on the prior day.  Had recommended follow up chest x-ray in 2-3 months.   DISCHARGE DISPOSITION:  Stable and improved.  Discharge home with home  health care physical therapy.   DISCHARGE PHYSICAL THERAPY GOALS:  Physical therapy will work on gait  training, proprioception, minimize pain, maximize range of motion,  increase strength.  We want to also encourage activities of daily  living.   DISCHARGE FOLLOWUP:  Follow up with Dr. Charlann Boxer at the office  in 2 weeks  for wound check.   DISCHARGE DIET:  Regular, as tolerated by patient.   DISCHARGE WOUND CARE:  Change dressing on a daily basis, keep dry.   DISCHARGE MEDICATIONS:  1. Lisinopril 20 mg 1 p.o. b.i.d.  2. Zoloft 25 mg 1 p.o. q.h.s.  3. HCTZ 25 mg 1/2 tab p.o. q.a.m.  4. Oxytrol patch 3.9 times 2 weekly.  5. Lovenox 30 mg 1 subcu q. 12 times 11 days.  6. Oxycodone Immediate Release 5 mg 1-2 p.o. q. 4-6 p.r.n. pain.  7. Robaxin 500 mg 1 p.o. q. 6  p.r.n. muscle spasm.  8. Colace 100 mg 1 p.o. b.i.d. p.r.n. constipation.  9. Enteric coated aspirin 325 mg 1 p.o. q.d. after her Lovenox times 4      weeks.  10.Iron sulfate 325 mg 1 p.o. t.i.d. times 3 weeks.   DISCHARGE SPECIAL INSTRUCTIONS:  If the patient develops chest pain  and/or acute shortness of breath please call the emergency service  immediately.   If the patient develops temperature greater than 101.5, which is  unresponsive to anti-inflammatories, call doctor's office.     ______________________________  Yetta Glassman. Loreta Ave, [place]      Madlyn Frankel. Charlann Boxer, M.D.  Electronically Signed    BLM/MEDQ  D:  02/28/2007  T:  02/28/2007  Job:  [id number]

## 2011-03-17 NOTE — H&P (Signed)
NAME:  Alison Richards, Alison Richards           ACCOUNT NO.:  [id number]   MEDICAL RECORD NO.:  [id number]          PATIENT TYPE:  INP   LOCATION:  NA                           FACILITY:  [facility]   PHYSICIAN:  Madlyn Frankel. Charlann Boxer, M.D.  DATE OF BIRTH:  [birth year]   DATE OF ADMISSION:  DATE OF DISCHARGE:                              HISTORY & PHYSICAL   PROCEDURE:  Left total knee replacement.   CHIEF COMPLAINT:  Left knee pain.   HISTORY OF PRESENT ILLNESS:  This is a 60 year old female with a history  of bilateral knee pain.  Her left knee has been worse than her right.  This has been symptomatically versus radiographically revealed.  She has  been refractory to all conservative treatment including over-the-counter  anti-inflammatories and prescription anti-inflammatories, as well as  injections.  She has been cleared presurgically by her primary care  physician, Dr. Carter Kitten.   PAST MEDICAL HISTORY:  1. Migraines.  2. Asthma.  3. Hiatal hernia.  4. Hypertension.  5. Osteoarthritis.   PAST SURGICAL HISTORIES:  1. Spine surgery by Dr. Wynetta Emery in 2002.  2. Hysterectomy in 2000.  3. Bladder surgery in 1995.  4. Tubal ligation in 1979.  5. C-section in 1979.  6. Tonsillectomy.   FAMILY HISTORY:  Hypertension, cancer and osteoarthritis.   SOCIAL HISTORY:  The patient is married.  Husband will be primary  caregiver after surgery.   DRUG ALLERGIES:  TYLENOL, tongue swells and gets a migraine; SULFA  DRUGS.   MEDICATIONS:  1. Lisinopril 20 mg one p.o. b.i.d.  2. Zoloft 225 mg one daily.  3. Hydrochlorothiazide half tablet daily.  4. Oxytrol 3.9 patch.   REVIEW OF SYSTEMS:  GI:  As recent as Sunday, January 06, 2007, had some  abdominal pain and was admitted to the emergency department and  ultrasound revealed gallstones.  Surgery was planned on March 19 by Dr.  Kendrick Ranch.  I discussed with Dr. Charlann Boxer and Dr. Earlene Plater about knee surgery  afterwards.  Surgery has indicated she will be ready  afterwards.  Otherwise, no other signs or symptoms, or any other system complaints.  Please see HPI.   PHYSICAL EXAM:  VITAL SIGNS:  Pulse 76, respirations 18, blood pressure  128/94.  GENERAL:  Awake, alert and oriented, well-developed, well-nourished, in  no acute distress.  NECK:  Supple.  No carotid bruits.  CHEST:  Lungs clear to auscultation bilaterally.  BREASTS:  Deferred.  HEART:  Regular rate and rhythm without gallops, clicks, rubs or  murmurs.  ABDOMEN:  Soft, nontender and nondistended with bowel sounds present.  GENITOURINARY:  Deferred.  EXTREMITIES:  She has 0-100 degrees of range of motion.  She is tender  to palpation over the medial side.  SKIN:  No cellulitis.  PERIPHERAL VASCULAR:  Dorsalis pedis pulse positive.  NEUROLOGIC:  Intact distal sensibilities.   LABORATORY AND ACCESSORY CLINICAL DATA:  Labs, chest x-ray and EKG are  pending  presurgical clearance.   IMPRESSION:  Left knee osteoarthritis.   PLAN OF ACTION:  Left total knee arthroplasty, January 21, 2007 at [facility], surgeon, Dr. Molli Hazard  Olin.  Risks and complications were  discussed.  Questions were encouraged, answered and reviewed.     ______________________________  Yetta Glassman Loreta [address], [place]      Madlyn Frankel. Charlann Boxer, M.D.  Electronically Signed    BLM/MEDQ  D:  01/16/2007  T:  01/17/2007  Job:  [id number]

## 2011-03-17 NOTE — Op Note (Signed)
Alison Richards, Alison Richards           ACCOUNT NO.:  [id number]   MEDICAL RECORD NO.:  [id number]          PATIENT TYPE:  AMB   LOCATION:  DAY                          FACILITY:  [facility]   PHYSICIAN:  Timothy E. Earlene Plater, M.D. DATE OF BIRTH:  13-Apr-1951   DATE OF PROCEDURE:  01/15/2007  DATE OF DISCHARGE:                               OPERATIVE REPORT   PREOPERATIVE DIAGNOSIS:  Cholecystolithiasis.   POSTOPERATIVE DIAGNOSIS:  Cholecystolithiasis.   PROCEDURE:  Laparoscopic cholecystectomy and operative cholangiogram.   SURGEON:  Timothy E. Earlene Plater, M.D.   ASSISTANT:  Adolph Pollack, M.D.   ANESTHESIA:  General.   Alison Richards is healthy, 55, obese.  Some hypertension.  A history of  long-term obesity, degenerative arthritis.  She has developed increasing  symptoms of cholecystolithiasis from known gallstones.  Previous  laboratory data, though, had been negative.  Because of upcoming joint  surgery, she wishes to proceed urgently with this gallbladder surgery  prior to those events.  Significantly, she is on medication for  hypotension.  She is allergic to LATEX, TYLENOL and MORPHINE.  Prior  surgery is noted.   Today her laboratory data is completely normal, including CBC and  chemistry profile, chest x-ray and cardiogram.  She is seen, identified,  and the permit signed.   She is taken to the operating room, placed supine.  General endotracheal  anesthesia administered.  The abdomen was prepped and draped in the  usual fashion.  An infraumbilical incision made.  The peritoneum  identified and opened, and the peritoneum entered without complication.  Hasson catheter placed and tied in place with #1 Vicryl.  The  gallbladder appeared chronically inflamed but not acute.  A 10 mm trocar  placed in the mid epigastrium, two 5 mm trocars in the right upper  quadrant.  The gallbladder was grasped and placed on tension.  Careful  dissection at the base of the gallbladder revealed a  single normal-  appearing cystic duct.  This was completely dissected out with a full  window posteriorly.  Also a lymphatic structure and artery entering the  gallbladder.  A clip was placed on the gallbladder side of the cystic  duct.  The cystic duct opened.  Clear bile exuded.  A percutaneously  passed catheter was placed into the cystic duct remnant.  The clip  applied, and then using real-time fluoroscopy, a cholangiogram was made.  There was rapid and complete filling of the biliary system and spillage  into the duodenum.  This all appeared normal.  To note, the right  hepatic had a common take-off with the cystic ducts.  So, the clip and  catheter were removed.  At the point of dissection near the gallbladder,  the cystic duct was triply clipped and divided.  The lymphatic structure  and artery were likewise dissected out completely, clipped and divided.  The dissection was kept extremely close to the gallbladder in the  gallbladder bed, and so all structures were reflected towards the liver.  The gallbladder was dissected out without complications or problems.  The gallbladder bed was dry.  The gallbladder removed through the  infraumbilical incision. That was closed under direct vision.  While  looking there, we found an abnormal nest of tissue on the anterior  abdominal wall.  It was pulsatile.  It appeared to be a couple of  centimeters in diameter on the right lower quadrant on the peritoneum of  the anterior abdominal wall.  Pictures were made.  Irrigation was done, was clear.  The wounds were inspected.  All  instruments, trocars, CO2, and irrigation were removed.  Each incision  checked and then closed with Monocryl and Steri-Strips.  Final counts  correct.  She tolerated it well and was removed from the recovery room  in good condition.      Timothy E. Earlene Plater, M.D.  Electronically Signed     TED/MEDQ  D:  01/15/2007  T:  01/15/2007  Job:  [id number]   cc:   Nani Gasser, M.D.  [phone]  [place], [place] [address]   Madlyn Frankel. Charlann Boxer, M.D.  Fax: [phone]

## 2011-03-17 NOTE — Op Note (Signed)
. [facility]  Patient:    Alison Richards, Alison Richards                  MRN: [id number] Proc. Date: 06/04/01 Adm. Date:  [id number] Attending:  Donalee Citrin P                           Operative Report  PREOPERATIVE DIAGNOSES:  Cervical spondylosis with large ruptured disk at C4-5 and C5-6 with left C5 and left C6 radiculopathy.  PROCEDURE:  Anterior cervical diskectomies and fusion at C4-5 and C5-6 using fibular allograft 7 mm plugs and a 42.5 mm Atlantis plate, six 13 mm fixed-angle screws.  SURGEON:  Garlon Hatchet., M.D.  ASSISTANT:  Reinaldo Meeker, M.D.  ANESTHESIA:  General endotracheal.  IV FLUIDS:  1 L.  ESTIMATED BLOOD LOSS:  Less than 100.  CLINICAL NOTE:  The patient is a very pleasant 60 year old female who has had long-standing neck and left arm pain that has radiated to her shoulder and into her thumb and first finger.  She failed conservative treatment with anti-inflammatories, and preoperative imaging showed a large free-fragment ruptured disk compressing the spinal cord in the left C5 and left C6 nerve root from severe spondylosis at C4-5 and 5-6.  The patient was subsequently counseled about the risks and benefits of surgery and decided to proceed forward.  DESCRIPTION OF PROCEDURE:  The patient was brought in the OR, was induced under general anesthesia, positioned supine with the head in slight extension. The right side of her neck was prepped and draped in the usual sterile fashion.  Preoperative x-ray localized the needle over the C5 vertebral body. Curvilinear incision was made just off the midline to the anterior border of the sternocleidomastoid.  The superficial layer of the platysma was dissected out and divided longitudinally.  The avascular plane between the sternocleidomastoid was developed down to the prevertebral fascia, which was dissected away with Kitners.  The longus colli was then reflected  laterally. Intraoperative x-ray confirmed localization at C5-6 interspace.  Then this was cut with 11 blade scalpel and the remainder of the anterior margin of the annulus was removed with pituitary rongeurs.  Then the remainder of the longus colli was reflected laterally, exposing both the 4-5 and 5-6 interspaces.  A self-retaining retractor was placed.  After this, a high-speed drill was brought in and after the annulotomy at the C4-5 disk space was achieved, the high-speed drill was used to drill on the anterior margin of the annulus and nucleus pulposus down to the posterior longitudinal ligament and osteophytes and posterior annulus at both levels.  Then a 1 and 2 mm Kerrison punch was taken first to the C4-5 level, and the anterior osteophyte was removed coming off the C5 vertebral body, compressing the left C6 nerve root, in piecemeal fashion.  Then the posterior longitudinal ligament was identified, and this was also removed in piecemeal fashion.  There was a large free-fragment disk that had migrated subligamentous preforminally.  That was teased out and removed.  After this, thecal sac was noted to be completely decompressed, and the remainder of the C5 neural foramen was opened up and explored and noted to be also compressed.  Then the right C5 neural foramen was opened up.  The remainder of the osteophyte was cleaned out.  The end plates were prepared for arthrodesis.  Gelfoam was overlaid in the interspace.  Then attention was taken to C5-6.  Again the 1 and 2 mm Kerrison punch was used to remove the posterior margin of the annulus and th osteophyte, and there was a large osteophyte noted at the C5 vertebral body.  This was removed in piecemeal fashion, and both C6 neural foramina were widely opened up after being noted to be compressed at the proximal aspect of the left C6 nerve from uncinate hypertrophy and a small disk bulge.  After the posterior longitudinal ligament was  removed in piecemeal fashion, both C6 neural foramina were opened up and explored with angled nerve hook.  The end plates at Z6-1 were prepared for arthrodesis, and Gelfoam was overlaid in this.  Then attention was taken to the C4-5 disk space.  Two 7 mm fibular allograft plugs were sized, selected, and inserted after the interbodies had been distracted, and then the remainder of the anterior margins of the vertebral bodies were then prepared to receive the plate, and a 09.6 mm Atlantis plate was selected and sized and inserted, and then with a fixed guide the holes were drilled, tapped, and six fixed-angle 13 mm screws were inserted.  Postop x-ray confirmed visualization of patent screws, and hardware.  The wound was copiously irrigated, and hemostasis was maintained.  The platysma was closed with 3-0 interrupted Vicryl, the skin was closed with running 4-0 subcuticular, benzoin and Steri-Strips were applied.  The patient went to the recovery room in stable condition.  At the end of the case, all needle counts, sponge counts were correct. DD:  06/04/01 TD:  06/05/01 Job: [id number]

## 2011-04-26 ENCOUNTER — Ambulatory Visit: Payer: BC Managed Care – PPO | Admitting: Family Medicine

## 2011-07-24 ENCOUNTER — Encounter: Payer: Self-pay | Admitting: Family Medicine

## 2011-07-24 ENCOUNTER — Inpatient Hospital Stay (INDEPENDENT_AMBULATORY_CARE_PROVIDER_SITE_OTHER)
Admission: RE | Admit: 2011-07-24 | Discharge: 2011-07-24 | Disposition: A | Discharge status: Home / Self Care | Payer: BC Managed Care – PPO | Source: Ambulatory Visit | Attending: Family Medicine | Admitting: Family Medicine

## 2011-07-24 ENCOUNTER — Telehealth: Payer: Self-pay | Admitting: Family Medicine

## 2011-07-24 DIAGNOSIS — G4733 Obstructive sleep apnea (adult) (pediatric): Secondary | ICD-10-CM

## 2011-07-24 DIAGNOSIS — M940 Chondrocostal junction syndrome [Tietze]: Secondary | ICD-10-CM

## 2011-07-24 DIAGNOSIS — J069 Acute upper respiratory infection, unspecified: Secondary | ICD-10-CM

## 2011-07-24 NOTE — Telephone Encounter (Signed)
Pt calling and said she needs and MDi and prednisone called.  Cannot go though the night without an inhaler.  C/O wheezing, short winded with exertion, tight in chest.  Has chronic history asthma in chart. Plan:   Pt instructed no appts left for today, but [facility]. Jarvis Newcomer, LPN [facility]

## 2011-07-26 ENCOUNTER — Telehealth (INDEPENDENT_AMBULATORY_CARE_PROVIDER_SITE_OTHER): Payer: Self-pay | Admitting: Emergency Medicine

## 2011-08-07 ENCOUNTER — Other Ambulatory Visit: Payer: Self-pay | Admitting: Family Medicine

## 2011-08-07 DIAGNOSIS — Z1231 Encounter for screening mammogram for malignant neoplasm of breast: Secondary | ICD-10-CM

## 2011-08-08 ENCOUNTER — Encounter: Payer: Self-pay | Admitting: Family Medicine

## 2011-08-08 ENCOUNTER — Inpatient Hospital Stay (INDEPENDENT_AMBULATORY_CARE_PROVIDER_SITE_OTHER)
Admission: RE | Admit: 2011-08-08 | Discharge: 2011-08-08 | Disposition: A | Discharge status: Home / Self Care | Payer: BC Managed Care – PPO | Source: Ambulatory Visit | Attending: Family Medicine | Admitting: Family Medicine

## 2011-08-08 DIAGNOSIS — R1013 Epigastric pain: Secondary | ICD-10-CM

## 2011-08-12 ENCOUNTER — Telehealth (INDEPENDENT_AMBULATORY_CARE_PROVIDER_SITE_OTHER): Payer: Self-pay | Admitting: Emergency Medicine

## 2011-08-16 ENCOUNTER — Ambulatory Visit
Admission: RE | Admit: 2011-08-16 | Discharge: 2011-08-16 | Disposition: A | Discharge status: Home / Self Care | Payer: BC Managed Care – PPO | Source: Ambulatory Visit | Attending: Family Medicine | Admitting: Family Medicine

## 2011-08-16 DIAGNOSIS — Z1231 Encounter for screening mammogram for malignant neoplasm of breast: Secondary | ICD-10-CM

## 2011-08-17 ENCOUNTER — Ambulatory Visit (INDEPENDENT_AMBULATORY_CARE_PROVIDER_SITE_OTHER): Payer: BC Managed Care – PPO | Admitting: Family Medicine

## 2011-08-17 ENCOUNTER — Encounter: Payer: Self-pay | Admitting: Family Medicine

## 2011-08-17 DIAGNOSIS — K3189 Other diseases of stomach and duodenum: Secondary | ICD-10-CM

## 2011-08-17 DIAGNOSIS — M1990 Unspecified osteoarthritis, unspecified site: Secondary | ICD-10-CM

## 2011-08-17 DIAGNOSIS — K219 Gastro-esophageal reflux disease without esophagitis: Secondary | ICD-10-CM

## 2011-08-17 DIAGNOSIS — R1013 Epigastric pain: Secondary | ICD-10-CM

## 2011-08-17 DIAGNOSIS — M129 Arthropathy, unspecified: Secondary | ICD-10-CM

## 2011-08-17 LAB — CBC WITH DIFFERENTIAL/PLATELET
Basophils Relative: 0 % (ref 0–1)
Eosinophils Absolute: 0.2 10*3/uL (ref 0.0–0.7)
Hemoglobin: 14.4 g/dL (ref 12.0–15.0)
Lymphs Abs: 2.7 10*3/uL (ref 0.7–4.0)
MCH: 29.8 pg (ref 26.0–34.0)
Monocytes Relative: 6 % (ref 3–12)
Neutro Abs: 5.4 10*3/uL (ref 1.7–7.7)
Neutrophils Relative %: 61 % (ref 43–77)
Platelets: 288 10*3/uL (ref 150–400)
RBC: 4.83 MIL/uL (ref 3.87–5.11)

## 2011-08-17 MED ORDER — PANTOPRAZOLE SODIUM 40 MG PO TBEC: 40.0000 mg | DELAYED_RELEASE_TABLET | Freq: Every day | ORAL | Status: DC

## 2011-08-17 MED ORDER — SUCRALFATE 1 G PO TABS: 2.0000 g | ORAL_TABLET | Freq: Two times a day (BID) | ORAL | Status: DC

## 2011-08-17 MED ORDER — MELOXICAM 7.5 MG PO TABS: 7.5000 mg | ORAL_TABLET | Freq: Every day | ORAL | Status: DC

## 2011-08-17 NOTE — Patient Instructions (Signed)
Follow up w/Dr Linford Arnold in 2 months sooner if needed. Will treat w/prevpack if H  Pylori if positive Use carafate as welll mobic for joint pain do not use w/motrin or alleve

## 2011-08-18 LAB — H. PYLORI ANTIBODY, IGG: H Pylori IgG: >8 {ISR} — ABNORMAL HIGH

## 2011-08-20 NOTE — Progress Notes (Signed)
  Subjective:    Patient ID: Alison Richards, female    DOB: [birth year], 60 y.o.   MRN: [id number]  Abdominal Pain Associated symptoms include arthralgias, myalgias and nausea.    Patient report having abdominal pain. Sometimes linked to her meals . She has had trouble over the last 3-4 weeks but it has ben progressively getting worse. No vomiting but occasional nausea and cramping w/pain. Most of the pain has been upper R Q and midline. She was in UC but the PPI she was placed on was costly so she started using her husbands. It has been about a week that she has been using her husband medication.  Review of Systems  Gastrointestinal: Positive for nausea and abdominal pain.  Musculoskeletal: Positive for myalgias and arthralgias.  All other systems reviewed and are negative.   She uses motrin and alleve for joint pain  BP 138/75 T99.1 HT 5 4 W 279    Objective:   Physical Exam  Constitutional: She is oriented to person, place, and time. She appears well-developed and well-nourished.  HENT:  Head: Normocephalic.  Abdominal: Soft. Bowel sounds are normal. She exhibits no mass. There is tenderness. There is no guarding.       Tenderness in mid epigastric and RUQ.  Neurological: She is alert and oriented to person, place, and time.  Skin: Skin is warm and dry.  Psychiatric: She has a normal mood and affect.          Assessment & Plan:   Dyspepsia versus peptic ulcer DX Follow up w/Dr Linford Arnold in 2 months sooner if needed. Will treat w/prevpack if H  Pylori if positive Use carafate as welll mobic for joint pain do not use w/motrin or alleve Outpatient Encounter Prescriptions as of 08/17/2011  Medication Sig Dispense Refill  . Aliskiren-Hydrochlorothiazide (TEKTURNA HCT) 300-12.5 MG TABS Take by mouth.        Marland Kitchen aspirin 325 MG tablet 325 mg. 2 tablets by mouth every 6 hours       . meloxicam (MOBIC) 7.5 MG tablet Take 1 tablet (7.5 mg total) by mouth daily. Do not take  w/motrin alleve or excedrin  30 tablet  2  . pantoprazole (PROTONIX) 40 MG tablet Take 1 tablet (40 mg total) by mouth daily.  30 tablet  6  . sucralfate (CARAFATE) 1 G tablet Take 2 tablets (2 g total) by mouth 2 (two) times daily before a meal.  120 tablet  3

## 2011-08-21 ENCOUNTER — Telehealth: Payer: Self-pay | Admitting: Family Medicine

## 2011-08-21 MED ORDER — AMOXICILL-CLARITHRO-LANSOPRAZ PO MISC: Freq: Two times a day (BID) | ORAL | Status: DC

## 2011-08-21 NOTE — Telephone Encounter (Signed)
Pt was informed of her results and notified Dr. Linford Arnold to send in the prevpak since that had not been done.   Jarvis Newcomer, LPN [facility]

## 2011-08-21 NOTE — Telephone Encounter (Signed)
Sent rx for prevpac

## 2011-08-21 NOTE — Telephone Encounter (Signed)
Pt calling for results of her H-Pylori test. Plan:  Went over the + result.  Instructions given.  It does not look like the prevpak was ordered.  Sent message to Dr. Linford Arnold to get ordered.   Routed to Dr. Marlyne Beards, LPN [facility]

## 2011-08-21 NOTE — Telephone Encounter (Signed)
Pt informed that prevpac sent to the pharmacy. Jarvis Newcomer, LPN [facility]

## 2011-08-21 NOTE — Telephone Encounter (Signed)
Message copied by Gifford Shave on Mon Aug 21, 2011  3:22 PM ------      Message from: Hassan Rowan      Created: Fri Aug 18, 2011  9:13 PM       Please notify her that her H. Pylori status is positive. I would place her on Prevpack for the 2 week course and then go back on the Protonix. She may continue on the Carafate if she would like and follow up w/DrM or me in 2-3 months.

## 2011-08-22 ENCOUNTER — Telehealth: Payer: Self-pay | Admitting: Family Medicine

## 2011-08-22 MED ORDER — METRONIDAZOLE 500 MG PO TABS: 500.0000 mg | ORAL_TABLET | Freq: Four times a day (QID) | ORAL | Status: AC

## 2011-08-22 MED ORDER — TETRACYCLINE HCL 500 MG PO CAPS: 500.0000 mg | ORAL_CAPSULE | Freq: Four times a day (QID) | ORAL | Status: DC

## 2011-08-22 MED ORDER — BISMUTH SUBSALICYLATE 525 MG/15ML PO SUSP: 15.0000 mL | Freq: Four times a day (QID) | ORAL | Status: DC

## 2011-08-22 NOTE — Telephone Encounter (Signed)
Sent over new rx to her pharm.  It is 4 times a day and will be several rx since can't tolerate mycins.

## 2011-08-22 NOTE — Telephone Encounter (Signed)
Pharmacist called from [facility] stating pt had told her she was allergic to Erythromycin and had swelling and breathing trouble with this med in the past.  Med sent to pharm yesterday with clarithromycin, and pharmacist calling to verify pt reaction as documentation reads in pt chart file. Plan:  Pharmacist notified symptoms of this med were GI related. Jarvis Newcomer, LPN [facility]

## 2011-08-22 NOTE — Telephone Encounter (Signed)
We can try the lomotil or if she prefers we can change the prevpac. Not sure is she has already paid for it or not.

## 2011-08-22 NOTE — Telephone Encounter (Signed)
Pt has not paid for it yet so if we could go ahead and change the medicine that would be good. Jarvis Newcomer, LPN [facility]

## 2011-08-22 NOTE — Telephone Encounter (Signed)
Left message with spouse

## 2011-08-22 NOTE — Telephone Encounter (Signed)
Pharmacist called the pt at home to inquire about the GI symptoms she has when taking mycin drugs.  Pt states it was extreme GI symptoms.  This in regards to the Ab sent over yesterday for the pt to the pharm(prevpak combo).  Pharmacist has suggested that maybe we can also prescribe some lomotil to help the pt with the GI symptoms since pt needs the prevpak. Plan:  Routed call to the provider to advise. Jarvis Newcomer, LPN [facility]

## 2011-08-28 ENCOUNTER — Telehealth: Payer: Self-pay | Admitting: *Deleted

## 2011-08-28 NOTE — Telephone Encounter (Signed)
Called pt to inform her they no longer make the tetracycline that was ordered for her H. Pylori and to see if pt had picked up 3 other meds at pharmacy on Friday. Pt states she did pick up the meds at pharmacy and also picked up the Amoxicillan and Clarithromycin even though stated allergic to clarithromycin. Said spoke with pharmacist and he instructed her to take the prevacid on empty stomach then eat 1 hour later and then take antibiotics. This is what pt is currently doing and has not had any problems thus far and is starting to feel a little better. Instructed pt that if started feeling worse again or just could not tolerate med then to let us know and we could call in the Pylera for her. Pt agrees.

## 2011-09-03 ENCOUNTER — Emergency Department (INDEPENDENT_AMBULATORY_CARE_PROVIDER_SITE_OTHER)
Admission: EM | Admit: 2011-09-03 | Discharge: 2011-09-03 | Disposition: A | Discharge status: Home / Self Care | Payer: BC Managed Care – PPO | Source: Home / Self Care | Attending: Emergency Medicine | Admitting: Emergency Medicine

## 2011-09-03 ENCOUNTER — Ambulatory Visit: Payer: BC Managed Care – PPO | Admitting: Emergency Medicine

## 2011-09-03 DIAGNOSIS — J029 Acute pharyngitis, unspecified: Secondary | ICD-10-CM

## 2011-09-03 DIAGNOSIS — J069 Acute upper respiratory infection, unspecified: Secondary | ICD-10-CM

## 2011-09-03 LAB — POCT RAPID STREP A (OFFICE): Rapid Strep A Screen: NEGATIVE

## 2011-09-03 MED ORDER — FLUCONAZOLE 150 MG PO TABS: 150.0000 mg | ORAL_TABLET | Freq: Once | ORAL | Status: AC

## 2011-09-03 NOTE — ED Provider Notes (Signed)
History    Sore throat for 1-2 days.  She is on treatment currently for H/ pylori including Biaxin and high dose Amox.  Throat feels sore, yesterday had some white coating.  Today looked better but feels a little irritated.  Mild URI symptoms including runny nose and ear congestion.  CSN: [id number] Arrival date & time: 09/03/2011  2:11 PM   First MD Initiated Contact with Patient 09/03/11 1417      Chief Complaint  Patient presents with  . Sore Throat    (Consider location/radiation/quality/duration/timing/severity/associated sxs/prior treatment) HPI  Past Medical History  Diagnosis Date  . Arm numbness   . Palpitations   . Orthostatic lightheadedness   . Pain in joint   . Obstructive sleep apnea   . Hypertension   . Asthma   . Candidiasis of skin and nail   . Major depression     Past Surgical History  Procedure Date  . Bladder surgery   . Tubal ligation 1979  . Cesarean section 1979  . Abdominal hysterectomy 1999  . Neck sx for ruptured disc c4-5 2001  . Tonsillectomy age 60  . Cholecystectomy 3-08    Dr [place]  . Left knee replacement 2008    Family History  Problem Relation Age of Onset  . Alzheimer's disease Mother   . Hypertension Mother   . Hyperlipidemia Mother   . Alcohol abuse Father   . Cancer Father     skin, prostate, lung  . COPD Father     History  Substance Use Topics  . Smoking status: Never Smoker   . Smokeless tobacco: Not on file  . Alcohol Use: No    OB History    Grav Para Term Preterm Abortions TAB SAB Ect Mult Living                  Review of Systems  Allergies  Acetaminophen; Erythromycin ethylsuccinate; Hyzaar; Morphine sulfate; Sulfonamide derivatives; and Valsartan  Home Medications   Current Outpatient Rx  Name Route Sig Dispense Refill  . ALISKIREN-HYDROCHLOROTHIAZIDE 300-12.5 MG PO TABS Oral Take by mouth.      . ASPIRIN 325 MG PO TABS  325 mg. 2 tablets by mouth every 6 hours     . BISMUTH SUBSALICYLATE  525 MG/15ML PO SUSP Oral Take 15 mLs (525 mg total) by mouth 4 (four) times daily. X 10 days. 420 mL 0  . MELOXICAM 7.5 MG PO TABS Oral Take 1 tablet (7.5 mg total) by mouth daily. Do not take w/motrin alleve or excedrin 30 tablet 2  . PANTOPRAZOLE SODIUM 40 MG PO TBEC Oral Take 1 tablet (40 mg total) by mouth daily. 30 tablet 6  . SUCRALFATE 1 G PO TABS Oral Take 2 tablets (2 g total) by mouth 2 (two) times daily before a meal. 120 tablet 3  . TETRACYCLINE HCL 500 MG PO CAPS Oral Take 1 capsule (500 mg total) by mouth 4 (four) times daily. X 10 days. 40 capsule 0    BP 148/83  Pulse 91  Temp(Src) 98.2 F (36.8 C) (Oral)  Resp 20  Ht 5\' 4"  (1.626 m)  Wt 279 lb (126.554 kg)  BMI 47.89 kg/m2  SpO2 96%  Physical Exam  Constitutional: She is oriented to person, place, and time. She appears well-developed and well-nourished.  HENT:  Right Ear: Tympanic membrane, external ear and ear canal normal.  Left Ear: Tympanic membrane, external ear and ear canal normal.  Nose: Nose normal.  Mouth/Throat: Uvula  is midline, oropharynx is clear and moist and mucous membranes are normal. No oropharyngeal exudate, posterior oropharyngeal edema, posterior oropharyngeal erythema or tonsillar abscesses.  Cardiovascular: Normal rate.   Pulmonary/Chest: Effort normal.  Neurological: She is alert and oriented to person, place, and time.  Skin: Skin is warm and dry.    ED Course  Procedures (including critical care time)   Labs Reviewed  POCT RAPID STREP A (OFFICE)   No results found. Rapid strep negative  No diagnosis found.    MDM        Lily Kocher, MD 09/03/11 [phone]

## 2011-10-02 NOTE — Progress Notes (Signed)
Summary: FEVER,COUGH, CHEST TIGHTNESS...WSE Room 2   Vital Signs:  Patient Profile:   60 Years Old Female CC:      Chest tight, wheezing, cough a 1 day Height:     65.5 inches Weight:      282 pounds O2 Sat:      96 % O2 treatment:    Room Air Temp:     98.5 degrees F oral Pulse rate:   87 / minute Pulse rhythm:   regular Resp:     18 per minute BP sitting:   133 / 76  (left arm) Cuff size:   large  Vitals Entered By: Emilio Math (July 24, 2011 5:49 PM)                  Current Allergies (reviewed today): ! ACETAMINOPHEN (ACETAMINOPHEN) ! E.E.S. 400 (ERYTHROMYCIN ETHYLSUCCINATE) ! MORPHINE SULFATE CR (MORPHINE SULFATE) ! SULFA ! * HYZAAR ! DIOVANHistory of Present Illness Chief Complaint: Chest tight, wheezing, cough a 1 day History of Present Illness:  Subjective: Patient complains of developing anterior chest tightness about 4 days ago, followed by wheezing and shortness of breath.  ProAir has been helpful No sore throat + cough, non-productive, worse at night                           No pleuritic pain No nasal congestion No post-nasal drainage No sinus pain/pressure No itchy/red eyes No earache No hemoptysis No fever, + chills No nausea No vomiting No abdominal pain No diarrhea No skin rashes + fatigue                                                               Current Meds TEKTURNA HCT 300-12.5 MG TABS (ALISKIREN-HYDROCHLOROTHIAZIDE) Take 1 tab by mouth once daily ZYRTEC ALLERGY 10 MG CAPS (CETIRIZINE HCL)  MUCINEX DM 30-600 MG XR12H-TAB (DEXTROMETHORPHAN-GUAIFENESIN)  PROAIR HFA 108 (90 BASE) MCG/ACT AERS (ALBUTEROL SULFATE) Two inhalations q4-6hr as needed.  Max 12 puffs/day PREDNISONE 10 MG TABS (PREDNISONE) 2 PO today, then 2 BID for 2 days, then 1 two times a day for 2 days, then 1 daily for 2 days.  Take PC CEFDINIR 300 MG CAPS (CEFDINIR) 1 by mouth q12hr (Rx void after10/3/12)  REVIEW OF SYSTEMS Constitutional Symptoms      Denies  fever, chills, night sweats, weight loss, weight gain, and fatigue.  Eyes       Denies change in vision, eye pain, eye discharge, glasses, contact lenses, and eye surgery. Ear/Nose/Throat/Mouth       Denies hearing loss/aids, change in hearing, ear pain, ear discharge, dizziness, frequent runny nose, frequent nose bleeds, sinus problems, sore throat, hoarseness, and tooth pain or bleeding.  Respiratory       Complains of dry cough, wheezing, shortness of breath, and asthma.      Denies productive cough, bronchitis, and emphysema/COPD.  Cardiovascular       Denies murmurs, chest pain, and tires easily with exhertion.    Gastrointestinal       Denies stomach pain, nausea/vomiting, diarrhea, constipation, blood in bowel movements, and indigestion. Genitourniary       Denies painful urination, kidney stones, and loss of urinary control. Neurological       Denies paralysis,  seizures, and fainting/blackouts. Musculoskeletal       Complains of muscle pain and joint pain.      Denies joint stiffness, decreased range of motion, redness, swelling, muscle weakness, and gout.  Skin       Denies bruising, unusual mles/lumps or sores, and hair/skin or nail changes.  Psych       Denies mood changes, temper/anger issues, anxiety/stress, speech problems, depression, and sleep problems.  Past History:  Past Medical History: Reviewed history from 05/19/2010 and no changes required. ARM NUMBNESS (ICD-782.0) PALPITATIONS (ICD-785.1) ORTHOSTATIC DIZZINESS (ICD-780.4) PAIN IN JOINT, MULTIPLE SITES (ICD-719.49) OBSTRUCTIVE SLEEP APNEA (ICD-327.23) CANDIDIASIS, SKIN/NAILS (ICD-112.3) HYPERTENSION, BENIGN SYSTEMIC (ICD-401.1)     - Rec d/c ACE October 13, 2009 for pseudoasthma >  dramatic improvement  ASTHMA NOS W/ACUTE EXACERBATION (BJY-782.95)     - PFT's wnl November 24, 2009 during ? uri cough DEPRESSION, MAJOR, RECURRENT (ICD-296.30) Hx of proteinuria -See Dr. Briant Cedar Cardiolyte stress test neg  05-19-08 at [facility] degenerative spine  Past Surgical History: Reviewed history from 12/05/2010 and no changes required. Bladder surgery  1995, c/sec and tubal ligation 1979 Hysterectomy  1999, Neck sx for ruptured disc C4-5  2001 Tonsillectomy  age 82 Dr.Tim Davis-cholecystectomy 3/08 Left Knee replacment in 2008  Family History: Reviewed history from 10/12/2009 and no changes required. 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  Never smoked, no EtOH, no drugs, no caffeine, swims occ for exercise.   Objective:  No acute distress  Eyes:  Pupils are equal, round, and reactive to light and accomodation.  Extraocular movement is intact.  Conjunctivae are not inflamed.  Ears:  Canals normal.  Tympanic membranes normal.   Nose:  Mildly congested turbinates.  No sinus tenderness   Pharynx:  Normal  Neck:  Supple.  Tender shotty posterior nodes are palpated bilaterally.    Lungs:  Clear to auscultation.  Breath sounds are equal.  Chest:  Tender to palpation of sternum Heart:  Regular rate and rhythm without murmurs, rubs, or gallops.  Abdomen:  Nontender without masses or hepatosplenomegaly.  Bowel sounds are present.  No CVA or flank tenderness.  Extremities:  No edema.  Pedal pulses are full and equal.  Assessment New Problems: COSTOCHONDRITIS, ACUTE (ICD-733.6) UPPER RESPIRATORY INFECTION, ACUTE (ICD-465.9)   Plan New Medications/Changes: CEFDINIR 300 MG CAPS (CEFDINIR) 1 by mouth q12hr (Rx void after10/3/12)  #20 x 0, 07/24/2011, Donna Christen MD PREDNISONE 10 MG TABS (PREDNISONE) 2 PO today, then 2 BID for 2 days, then 1 two times a day for 2 days, then 1 daily for 2 days.  Take PC  #16 x 0, 07/24/2011, Donna Christen MD PROAIR HFA 108 (90 BASE) MCG/ACT AERS (ALBUTEROL SULFATE) Two inhalations q4-6hr as needed.  Max 12 puffs/day  #1 MDI x 0, 07/24/2011, Donna Christen MD  New Orders: Pulse Oximetry (single measurment) [94760] Est. Patient Level IV [45409] Planning Comments:   Treat symptomatically for now:  Increase fluid intake, begin expectorant,  topical decongestant,  cough suppressant at bedtime.  Begin tapering course of prenisone.  If fever/chills/sweats persist, or if not improving 5  days begin Omnicef, (given Rx to hold).  Followup with PCP if not improving 7 to 10 days.                                                                                                                                                                                                                                  The patient and/or caregiver has been counseled thoroughly with regard to medications prescribed including dosage, schedule, interactions, rationale for use, and possible side effects and they verbalize understanding.  Diagnoses and expected course of recovery discussed and will return if not improved as expected or if the condition worsens. Patient and/or caregiver verbalized understanding.  Prescriptions: CEFDINIR 300 MG CAPS (CEFDINIR) 1 by mouth q12hr (Rx void after10/3/12)  #20 x 0   Entered and Authorized by:   Donna Christen MD   Signed by:   Donna Christen MD on 07/24/2011   Method used:   Print then Give to Patient   RxID:    [id number] PREDNISONE 10 MG TABS (PREDNISONE) 2 PO today, then 2 BID for 2 days, then 1 two times a day for 2 days, then 1 daily for 2 days.  Take PC  #16 x 0   Entered and Authorized by:   Donna Christen MD   Signed by:   Donna Christen MD on 07/24/2011   Method used:   Print then Give to Patient   RxID:   [id number] PROAIR HFA 108 (90 BASE) MCG/ACT AERS (ALBUTEROL SULFATE) Two inhalations q4-6hr as needed.  Max  12 puffs/day  #1 MDI x 0   Entered and Authorized by:   Donna Christen MD   Signed by:   Donna Christen MD on 07/24/2011   Method used:   Print then Give to Patient   RxID:   [id number]   Patient Instructions: 1)  Take Mucinex  (guaifenesin) twice daily for congestion. 2)  Increase fluid intake, rest. 3)  Stop Zyrtec for now. 4)  May use Afrin nasal spray (or generic oxymetazoline) twice daily for about 5 days.  Also recommend using saline nasal spray several times daily and/or saline nasal irrigation. 5)  May use Tussionex at bedtime for cough. 6)  Begin Cefdinir if not improving about 5 days or if persistent fever develops. 7)  Followup with family doctor if not improving 7 to 10 days.   Orders Added: 1)  Pulse Oximetry (single measurment) [94760] 2)  Est. Patient Level IV [69629]

## 2011-10-02 NOTE — Telephone Encounter (Signed)
  Phone Note Outgoing Call   Call placed by: Lavell Islam RN,  July 26, 2011 3:18 PM Call placed to: Patient Summary of Call: Spoke with patient who states she tried going to work today, but felt worse and was sent home. Knows to contact us or PCP if not progressing by tomorrow. Initial call taken by: Lavell Islam RN,  July 26, 2011 3:19 PM

## 2011-10-02 NOTE — Progress Notes (Signed)
Summary: STOMACH PAIN...WSE rm 4   Vital Signs:  Patient Profile:   60 Years Old Female CC:      stomach pain x 2 wks Height:     65.5 inches Weight:      279.75 pounds O2 Sat:      97 % O2 treatment:    Room Air Temp:     98.9 degrees F oral Pulse rate:   100 / minute Resp:     20 per minute BP sitting:   144 / 81  (right arm) Cuff size:   regular on forearm  Vitals Entered By: Clemens Catholic LPN (August 08, 2011 6:57 PM)                  Updated Prior Medication List: TEKTURNA HCT 300-12.5 MG TABS (ALISKIREN-HYDROCHLOROTHIAZIDE) Take 1 tab by mouth once daily  Current Allergies: ! ACETAMINOPHEN (ACETAMINOPHEN) ! E.E.S. 400 (ERYTHROMYCIN ETHYLSUCCINATE) ! MORPHINE SULFATE CR (MORPHINE SULFATE) ! SULFA ! * HYZAAR ! DIOVANHistory of Present Illness Chief Complaint: stomach pain x 2 wks History of Present Illness:  Subjective:  Patient complains of intermittent post-prandial abdominal bloating and discomfort for about two weeks.  No particular foods exacerbate the discomfort.  No nausea/vomiting.  No change in bowel movements.  No fevers, chills, and sweats.  She has history of cholecystectomy in 2006.  She tried OTC famotidine without improvement.  However, Mylanta definitely improves her symptoms.  The discomfort does not awaken her at night.  No GU or respiratory symptoms   REVIEW OF SYSTEMS Constitutional Symptoms      Denies fever, chills, night sweats, weight loss, weight gain, and fatigue.  Eyes       Denies change in vision, eye pain, eye discharge, glasses, contact lenses, and eye surgery. Ear/Nose/Throat/Mouth       Denies hearing loss/aids, change in hearing, ear pain, ear discharge, dizziness, frequent runny nose, frequent nose bleeds, sinus problems, sore throat, hoarseness, and tooth pain or bleeding.  Respiratory       Denies dry cough, productive cough, wheezing, shortness of breath, asthma, bronchitis, and emphysema/COPD.  Cardiovascular       Denies  murmurs, chest pain, and tires easily with exhertion.    Gastrointestinal       Complains of stomach pain.      Denies nausea/vomiting, diarrhea, constipation, blood in bowel movements, and indigestion. Genitourniary       Denies painful urination, kidney stones, and loss of urinary control. Neurological       Denies paralysis, seizures, and fainting/blackouts. Musculoskeletal       Denies muscle pain, joint pain, joint stiffness, decreased range of motion, redness, swelling, muscle weakness, and gout.  Skin       Denies bruising, unusual mles/lumps or sores, and hair/skin or nail changes.  Psych       Denies mood changes, temper/anger issues, anxiety/stress, speech problems, depression, and sleep problems. Other Comments: pt c/o upper abd pain, growling, loose stool x 2 wks after eating. she had a endo and colonoscopy 6-7 yrs ago. she has taken OTC famotidine 10mg .   Past History:  Past Medical History: Reviewed history from 05/19/2010 and no changes required. ARM NUMBNESS (ICD-782.0) PALPITATIONS (ICD-785.1) ORTHOSTATIC DIZZINESS (ICD-780.4) PAIN IN JOINT, MULTIPLE SITES (ICD-719.49) OBSTRUCTIVE SLEEP APNEA (ICD-327.23) CANDIDIASIS, SKIN/NAILS (ICD-112.3) HYPERTENSION, BENIGN SYSTEMIC (ICD-401.1)     - Rec d/c ACE October 13, 2009 for pseudoasthma >  dramatic improvement  ASTHMA NOS W/ACUTE EXACERBATION (XBM-841.32)     - PFT's  wnl November 24, 2009 during ? uri cough DEPRESSION, MAJOR, RECURRENT (ICD-296.30) Hx of proteinuria -See Dr. Briant Cedar Cardiolyte stress test neg 05-19-08 at [facility] degenerative spine  Past Surgical History: Bladder surgery  1995, c/sec and tubal ligation 1979 Hysterectomy  1999, Neck sx for ruptured disc C4-5  2001 Tonsillectomy  age 86 Dr.Tim Davis-cholecystectomy 3/08 Left Knee replacment in 2008 Cholecystectomy  Family History: Reviewed history from 10/12/2009 and no changes required. Father with alcoholism, skin CA, Postate CA,  COPD, Lung Ca -deceased Grandparents with MI Mother with alzheimers, hi chol, HTN brother alive and good health  Social History: Reviewed history from 08/07/2006 and no changes required. 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  Never smoked, no EtOH, no drugs, no caffeine, swims occ for exercise.   Objective:  Obese, no acute distress.  She is alert and oriented  Eyes:  Pupils are equal, round, and reactive to light and accomodation.  Extraocular movement is intact.  Conjunctivae are not inflamed.  Mouth/jpharynx:  Normal Neck:  Supple.  No adenopathy is present.  Lungs:  Clear to auscultation.  Breath sounds are equal.  Heart:  Regular rate and rhythm without murmurs, rubs, or gallops.  Abdomen:  Mild sub-xiphoid epigastric tenderness without masses or hepatosplenomegaly.  Bowel sounds are present.  No CVA or flank tenderness.  Assessment New Problems: EPIGASTRIC PAIN (ICD-789.06)  SUSPECT GERD  Plan New Medications/Changes: NEXIUM 40 MG CPDR (ESOMEPRAZOLE MAGNESIUM) 1 by mouth daily ac  #15 x 1, 08/08/2011, Donna Christen MD  New Orders: Est. Patient Level III [phone] Planning Comments:   Begin trial of Nexium, and continue for one month if improving. Follow-up with gastroenterologist if not improving.  Return for worsening symptoms   The patient and/or caregiver has been counseled thoroughly with regard to medications prescribed including dosage, schedule, interactions, rationale for use, and possible side effects and they verbalize understanding.  Diagnoses and expected course of recovery discussed and will return if not improved as expected or if the condition worsens. Patient and/or caregiver verbalized understanding.  Prescriptions: NEXIUM 40 MG CPDR (ESOMEPRAZOLE MAGNESIUM) 1 by mouth daily ac  #15 x 1   Entered and Authorized by:   Donna Christen MD   Signed by:   Donna Christen MD on  08/08/2011   Method used:   Print then Give to Patient   RxID:   [id number]   Orders Added: 1)  Est. Patient Level III [84132]

## 2011-10-02 NOTE — Telephone Encounter (Signed)
  Phone Note Outgoing Call   Call placed by: Lavell Islam RN,  August 12, 2011 1:10 PM Call placed to: Patient Summary of Call: Left message on voice mail inquiring on patient's status and encouraging her to call with questions/concerns. Initial call taken by: Lavell Islam RN,  August 12, 2011 1:10 PM

## 2011-10-02 NOTE — Letter (Signed)
Summary: Out of Work  [facility]  [address]   [place], [place] [address]   Phone: [phone]  Fax: [phone]    July 24, 2011   Employee:  [employer]rey[employer] [facility]    To Whom It May Concern:   For Medical reasons, please excuse the above named employee from work tomorrow.   If you need additional information, please feel free to contact our office.         Sincerely,    Donna Christen MD

## 2011-10-06 ENCOUNTER — Telehealth: Payer: Self-pay | Admitting: Family Medicine

## 2011-10-06 NOTE — Telephone Encounter (Signed)
Call pt: Needs to schedule appt for Pre-op physical. She can schedule with me or wade, who ever has availability.

## 2011-10-06 NOTE — Telephone Encounter (Signed)
Left message on pt.'s vm.

## 2011-10-17 ENCOUNTER — Telehealth: Payer: Self-pay | Admitting: *Deleted

## 2011-10-17 ENCOUNTER — Encounter: Payer: Self-pay | Admitting: Family Medicine

## 2011-10-17 ENCOUNTER — Ambulatory Visit: Payer: BC Managed Care – PPO | Admitting: Family Medicine

## 2011-10-20 ENCOUNTER — Encounter: Payer: Self-pay | Admitting: Family Medicine

## 2011-10-20 ENCOUNTER — Ambulatory Visit (INDEPENDENT_AMBULATORY_CARE_PROVIDER_SITE_OTHER): Payer: BC Managed Care – PPO | Admitting: Family Medicine

## 2011-10-20 VITALS — BP 143/73 | HR 103 | Wt 283.0 lb

## 2011-10-20 DIAGNOSIS — Z01818 Encounter for other preprocedural examination: Secondary | ICD-10-CM

## 2011-10-20 NOTE — Progress Notes (Signed)
Subjective:    Patient ID: Alison Richards, female    DOB: 01-18-1951, 60 y.o.   MRN: [id number]  HPI Here today for preop exam. She is scheduled for a right knee replacement decreased orthopedics. She has no history of cardiac disease or pulmonary disease. She does have a history of sleep apnea. She denies any recent chest pain or shortness of breath.  GERD-she was positive for H. pylori and we treated her with antibiotic and PPI therapy. She has done really well over the last month since completing her antibiotics until yesterday. She started to have recurrence of her abdominal pain. She has not been taking her PPI. She fell she was assessed up after she completed the antibiotics. She is feeling some better today. Just has a prescription for Carafate is not using it currently.   Review of Systems No CP, SOB. Blood in the urine or stool.    BP 143/73  Pulse 103  Wt 283 lb (128.368 kg)  SpO2 96%    Allergies  Allergen Reactions  . Acetaminophen     REACTION: hallucinations, H/A  . Erythromycin Ethylsuccinate     REACTION: gi symptoms  . Hyzaar     REACTION: Joibts aching, hurting all over  . Morphine Sulfate     REACTION: projectile vomiting  . Sulfonamide Derivatives     REACTION: Rash, swelling  . Valsartan     REACTION: Hurting all over    Past Medical History  Diagnosis Date  . Arm numbness   . Palpitations   . Orthostatic lightheadedness   . Pain in joint   . Obstructive sleep apnea   . Hypertension   . Asthma   . Candidiasis of skin and nail   . Major depression     Past Surgical History  Procedure Date  . Bladder surgery   . Tubal ligation 1979  . Cesarean section 1979  . Abdominal hysterectomy 1999  . Neck sx for ruptured disc c4-5 2001  . Tonsillectomy age 64  . Cholecystectomy 3-08    Dr Kendrick Ranch  . Left knee replacement 2008    History   Social History  . Marital Status: Married    Spouse Name: N/A    Number of Children: N/A  . Years  of Education: N/A   Occupational History  . Not on file.   Social History Main Topics  . Smoking status: Never Smoker   . Smokeless tobacco: Not on file  . Alcohol Use: No  . Drug Use: No  . Sexually Active:    Other Topics Concern  . Not on file   Social History Narrative  . No narrative on file    Family History  Problem Relation Age of Onset  . Alzheimer's disease Mother   . Hypertension Mother   . Hyperlipidemia Mother   . Alcohol abuse Father   . Cancer Father     skin, prostate, lung  . COPD Father       Current outpatient prescriptions:Aliskiren-Hydrochlorothiazide (TEKTURNA HCT) 300-12.5 MG TABS, Take by mouth.  , Disp: , Rfl: ;  Bismuth Subsalicylate 525 MG/15ML SUSP, Take 15 mLs (525 mg total) by mouth 4 (four) times daily. X 10 days., Disp: 420 mL, Rfl: 0;  meloxicam (MOBIC) 7.5 MG tablet, Take 1 tablet (7.5 mg total) by mouth daily. Do not take w/motrin alleve or excedrin, Disp: 30 tablet, Rfl: 2 pantoprazole (PROTONIX) 40 MG tablet, Take 1 tablet (40 mg total) by mouth daily., Disp: 30 tablet,  Rfl: 6;  sucralfate (CARAFATE) 1 G tablet, Take 2 tablets (2 g total) by mouth 2 (two) times daily before a meal., Disp: 120 tablet, Rfl: 3  Objective:   Physical Exam  BP 143/73  Pulse 103  Wt 283 lb (128.368 kg)  SpO2 96% General appearance: alert, cooperative, appears stated age and moderately obese Head: Normocephalic, without obvious abnormality, atraumatic Eyes: Conjunctiva clear, extraocular movements intact, PERRLA Ears: normal TM's and external ear canals both ears Nose: Nares normal. Septum midline. Mucosa normal. No drainage or sinus tenderness. Throat: lips, mucosa, and tongue normal; teeth and gums normal Neck: no adenopathy, no carotid bruit, no JVD, supple, symmetrical, trachea midline and thyroid not enlarged, symmetric, no tenderness/mass/nodules Back: symmetric, no curvature. ROM normal. No CVA tenderness. Lungs: clear to auscultation  bilaterally Heart: regular rate and rhythm, S1, S2 normal, no murmur, click, rub or gallop Abdomen: soft, non-tender; bowel sounds normal; no masses,  no organomegaly Extremities: extremities normal, atraumatic, no cyanosis or edema Pulses: 2+ and symmetric Skin: Skin color, texture, turgor normal. No rashes or lesions Lymph nodes: Cervical lymph nodes are normal Neurologic: Alert and oriented X 3, normal strength and tone. Normal symmetric reflexes. Normal coordination and gait       Assessment & Plan:  Preop exam-No prior hx of cardiac or pulmonary disease. She does have a history of sleep apnea. Will check CMP and CBC to make sure her electrolites, kidney, liver function is stable. Also check a CBC to make sure there is no anemia. She has no prior history of anemia. He did perform an EKG today. It showed a rate of 94 beats per minute, normal sinus rhythm with no acute changes. Will include a copy with the fax report for preop exam. If labs are ok the ok for surgery, noting that the anesthesiologist should be aware she has sleep apnea.   GERD- Much improved thought some recent sxs last couple of days. Restart the PPI and see if sxs back under control in the next 4-5 days. If not then call the office and will have GI see her.  Continue to work on reflux measures. She can restart the carafate prn as well but I think at this point the PPI by itself should be sufficient.

## 2011-10-23 LAB — BASIC METABOLIC PANEL
BUN: 14 mg/dL (ref 4–21)
Glucose: 81 mg/dL

## 2011-10-23 LAB — LIPID PANEL
Cholesterol: 172 mg/dL (ref 0–200)
HDL: 61 mg/dL (ref 35–70)

## 2011-10-23 LAB — HEPATIC FUNCTION PANEL
ALT: 18 U/L (ref 7–35)
AST: 18 U/L (ref 13–35)

## 2011-10-25 ENCOUNTER — Encounter: Payer: Self-pay | Admitting: Family Medicine

## 2011-10-27 ENCOUNTER — Encounter (HOSPITAL_COMMUNITY): Payer: Self-pay | Admitting: Pharmacy Technician

## 2011-11-01 NOTE — Progress Notes (Signed)
H&P done 11/01/11. Dictation # [id number]

## 2011-11-01 NOTE — H&P (Signed)
NAMESARANN, TREGRE NO.:  [id number]  MEDICAL RECORD NO.:  [id number]  LOCATION:  PERIO                        FACILITY:  [facility]  PHYSICIAN:  Madlyn Frankel. Charlann Boxer, M.D.  DATE OF BIRTH:  05-04-1951  DATE OF ADMISSION:  10/05/2011 DATE OF DISCHARGE:                             HISTORY & PHYSICAL   ADMITTING DIAGNOSIS:  Osteoarthritis, right knee.  HISTORY OF PRESENT ILLNESS:  This is a 61 year old lady with a history of total knee arthroplasty on the left knee.  She has done well, who has osteoarthritis on her right knee, which has failed conservative treatment.  She is now scheduled for total knee arthroplasty of the right knee.  The surgery, risks, benefits, and aftercare were discussed in detail with the patient.  Questions invited and answered.  Note that her medical doctor is Dr. Linford Arnold.  She is planning on going home after surgery.  She is given her home medicines of aspirin, iron, Robaxin, Colace, and MiraLax and she is a candidate for trans-cinnamic acid and dexamethasone and  we will receive that preop.  The patient is advised to bring her home CPAP mask and tubing for use postoperatively.  PAST MEDICAL HISTORY:  Drug allergies to Tylenol with nausea, vomiting, and tongue swelling; sulfa with swelling and rash; Morphine with projectile vomiting and erythromycin with GI disturbance.  Also, note that she is latex allergic.  Serious medical illnesses include sleep apnea, hypertension, hiatal hernia, degenerative disk disease of the C- spine.  CURRENT MEDICATIONS: 1. Tekturna hydrochlorothiazide 300/12.5 one daily. 2. Bismuth subsalicylate 525 mg per 15 mL, take 15 mL q.i.d. 3. Meloxicam 7.5 mg 1 daily. 4. Protonix 40 mg 1 daily. 5. Sucralfate 1 g 2 b.i.d.  PAST SURGICAL HISTORY:  Previous surgeries include cervical fusion, cholecystectomy, bilateral carpal tunnel, left knee total knee arthroplasty and hysterectomy.  FAMILY HISTORY:  Positive for COPD,  lung cancer, Alzheimer's and thyroid disease.  SOCIAL HISTORY:  The patient is married.  She lives at home.  She does not smoke and does not drink  and again plans on going home after surgery.  PHYSICAL EXAMINATION:  GENERAL:  This is a well-developed, well- nourished lady, in no acute distress. VITAL SIGNS:  Blood pressure 128/84, respirations 16, pulse 80 and regular.  Weight 127.3 kg. HEENT:  Head normocephalic.  Nose patent.  Ears patent.  Pupils equal, round, reactive to light.  Throat without injection.  NECK:  Supple without adenopathy.  Carotids 2+ without bruit. CHEST:  Clear to auscultation.  No rales or rhonchi.  Respirations 16. HEART:  Regular rate and rhythm at 80 beats per minute without murmur. ABDOMEN:  Soft.  Active bowel sounds.  No masses or organomegaly. NEUROLOGIC:  The patient alert and oriented to time, place, and person. Cranial nerves 2-12 grossly intact. EXTREMITIES:  Shows left knee status post total knee arthroplasty with full extension, further flexion to 120 degrees.  Right knee with a varus deformity, 3 degrees from full extension, further flexion to 115 degrees with pain.  Sensation and circulation are intact.  ASSESSMENT:  Osteoarthritis, right knee.  PLAN:  Total knee arthroplasty, right knee.     Jaquelyn Bitter. Izyk Marty, P.A.   ______________________________ Madlyn Frankel  Charlann Boxer, M.D.    SJC/MEDQ  D:  11/01/2011  T:  11/01/2011  Job:  [id number]

## 2011-11-03 ENCOUNTER — Ambulatory Visit (HOSPITAL_COMMUNITY)
Admission: RE | Admit: 2011-11-03 | Discharge: 2011-11-03 | Disposition: A | Discharge status: Home / Self Care | Payer: BC Managed Care – PPO | Source: Ambulatory Visit | Attending: Orthopedic Surgery | Admitting: Orthopedic Surgery

## 2011-11-03 ENCOUNTER — Encounter (HOSPITAL_COMMUNITY): Payer: Self-pay

## 2011-11-03 ENCOUNTER — Encounter (HOSPITAL_COMMUNITY)
Admission: RE | Admit: 2011-11-03 | Discharge: 2011-11-03 | Disposition: A | Discharge status: Home / Self Care | Payer: BC Managed Care – PPO | Source: Ambulatory Visit | Attending: Orthopedic Surgery | Admitting: Orthopedic Surgery

## 2011-11-03 DIAGNOSIS — Z01812 Encounter for preprocedural laboratory examination: Secondary | ICD-10-CM | POA: Insufficient documentation

## 2011-11-03 DIAGNOSIS — Z01818 Encounter for other preprocedural examination: Secondary | ICD-10-CM | POA: Insufficient documentation

## 2011-11-03 DIAGNOSIS — M1710 Unilateral primary osteoarthritis, unspecified knee: Secondary | ICD-10-CM | POA: Insufficient documentation

## 2011-11-03 HISTORY — DX: Personal history of other diseases of the digestive system: Z87.19

## 2011-11-03 HISTORY — DX: Other specified postprocedural states: Z98.890

## 2011-11-03 HISTORY — DX: Nausea with vomiting, unspecified: R11.2

## 2011-11-03 HISTORY — DX: Gastro-esophageal reflux disease without esophagitis: K21.9

## 2011-11-03 LAB — URINALYSIS, ROUTINE W REFLEX MICROSCOPIC
Glucose, UA: NEGATIVE mg/dL
Leukocytes, UA: NEGATIVE
Protein, ur: NEGATIVE mg/dL
Urobilinogen, UA: 0.2 mg/dL (ref 0.0–1.0)

## 2011-11-03 LAB — APTT: aPTT: 35 seconds (ref 24–37)

## 2011-11-03 LAB — PROTIME-INR: Prothrombin Time: 13.5 seconds (ref 11.6–15.2)

## 2011-11-03 NOTE — Patient Instructions (Addendum)
20 Alison Richards  11/03/2011   Your procedure is scheduled on:  Tuesday 1/15  AT 3:45 PM  Report to [facility] at  1:15 PM  Call this number if you have problems the morning of surgery: [phone]   Remember:   Do not eat food AFTER MIDNIGHT THE NIGHT BEFORE YOUR SURGERY.  May have clear liquids TO DRINK FROM MIDNIGHT UNTIL 9:30 AM THE DAY OF SURGERY--NOTHING AFTER 9:30 AM  Clear liquids include soda, tea, black coffee, apple or grape juice, WATER  Take these medicines the morning of surgery PROTONIX (PANTOPRAZOLE)   Do not wear jewelry, make-up or nail polish.  Do not wear lotions, powders, or perfumes. You may wear deodorant.  Do not shave 48 hours prior to surgery.  Do not bring valuables to the hospital.  Contacts, dentures or bridgework may not be worn into surgery.  Leave suitcase in the car. After surgery it may be brought to your room.  For patients admitted to the hospital, checkout time is 11:00 AM the day of discharge.   Patients discharged the day of surgery will not be allowed to drive home.    Special Instructions: CHG Shower Use Special Wash: 1/2 bottle night before surgery and 1/2 bottle morning of surgery.   Please read over the following fact sheets that you were given: Blood Transfusion Information and MRSA Information, AND INCENTIVE SPRIROMETRY INSTRUCTIONS

## 2011-11-03 NOTE — Pre-Procedure Instructions (Signed)
PT HAS EKG REPORT - DONE 10/23/11 AT [facility] [place]-REPORT PLACED ON THIS CHART. PT'S CBC WITH DIFF, CMET REPORTS FROM [facility]-DONE 10/23/11 PLACED ON PT'S CHART.   PT, PTT, UA AND PCR WERE DONE PREOP AT [facility]--T/S WILL BE DONE DAY OF SURGERY. CXR WAS DONE TODAY PREOP AT [facility].

## 2011-11-14 ENCOUNTER — Encounter (HOSPITAL_COMMUNITY): Payer: Self-pay | Admitting: *Deleted

## 2011-11-14 ENCOUNTER — Inpatient Hospital Stay (HOSPITAL_COMMUNITY): Payer: BC Managed Care – PPO | Admitting: Anesthesiology

## 2011-11-14 ENCOUNTER — Encounter (HOSPITAL_COMMUNITY): Payer: Self-pay | Admitting: Anesthesiology

## 2011-11-14 ENCOUNTER — Encounter (HOSPITAL_COMMUNITY)
Admission: RE | Disposition: A | Discharge status: Home Health | Payer: Self-pay | Source: Ambulatory Visit | Attending: Orthopedic Surgery

## 2011-11-14 ENCOUNTER — Inpatient Hospital Stay (HOSPITAL_COMMUNITY)
Admission: RE | Admit: 2011-11-14 | Discharge: 2011-11-17 | DRG: 209 | Disposition: A | Discharge status: Home Health | Payer: BC Managed Care – PPO | Source: Ambulatory Visit | Attending: Orthopedic Surgery | Admitting: Orthopedic Surgery

## 2011-11-14 DIAGNOSIS — M1710 Unilateral primary osteoarthritis, unspecified knee: Principal | ICD-10-CM | POA: Diagnosis present

## 2011-11-14 DIAGNOSIS — M1990 Unspecified osteoarthritis, unspecified site: Secondary | ICD-10-CM

## 2011-11-14 DIAGNOSIS — I10 Essential (primary) hypertension: Secondary | ICD-10-CM | POA: Diagnosis present

## 2011-11-14 DIAGNOSIS — K219 Gastro-esophageal reflux disease without esophagitis: Secondary | ICD-10-CM

## 2011-11-14 DIAGNOSIS — R1013 Epigastric pain: Secondary | ICD-10-CM

## 2011-11-14 DIAGNOSIS — K449 Diaphragmatic hernia without obstruction or gangrene: Secondary | ICD-10-CM | POA: Diagnosis present

## 2011-11-14 DIAGNOSIS — Z96659 Presence of unspecified artificial knee joint: Secondary | ICD-10-CM

## 2011-11-14 HISTORY — PX: TOTAL KNEE ARTHROPLASTY: SHX125

## 2011-11-14 SURGERY — ARTHROPLASTY, KNEE, TOTAL
Anesthesia: General | Site: Knee | Laterality: Right | Wound class: Clean

## 2011-11-14 MED ORDER — HYDROCHLOROTHIAZIDE 12.5 MG PO CAPS
12.5000 mg | ORAL_CAPSULE | Freq: Every day | ORAL | Status: DC
  Administered 2011-11-15 – 2011-11-17 (×3): 12.5 mg via ORAL
  Filled 2011-11-14 (×3): qty 1

## 2011-11-14 MED ORDER — DEXAMETHASONE SODIUM PHOSPHATE 10 MG/ML IJ SOLN
10.0000 mg | Freq: Once | INTRAMUSCULAR | Status: AC
  Administered 2011-11-15: 10 mg via INTRAVENOUS
  Filled 2011-11-14: qty 1

## 2011-11-14 MED ORDER — HYDROMORPHONE HCL PF 1 MG/ML IJ SOLN
0.2500 mg | INTRAMUSCULAR | Status: DC | PRN
  Administered 2011-11-14 (×4): 0.5 mg via INTRAVENOUS

## 2011-11-14 MED ORDER — HYDROMORPHONE HCL PF 1 MG/ML IJ SOLN
0.5000 mg | INTRAMUSCULAR | Status: DC | PRN
  Administered 2011-11-14 – 2011-11-15 (×4): 1 mg via INTRAVENOUS
  Filled 2011-11-14 (×4): qty 1

## 2011-11-14 MED ORDER — DIPHENHYDRAMINE HCL 25 MG PO CAPS: 25.0000 mg | ORAL_CAPSULE | Freq: Four times a day (QID) | ORAL | Status: DC | PRN

## 2011-11-14 MED ORDER — ACETAMINOPHEN 325 MG PO TABS: 650.0000 mg | ORAL_TABLET | Freq: Four times a day (QID) | ORAL | Status: DC | PRN

## 2011-11-14 MED ORDER — METHOCARBAMOL 100 MG/ML IJ SOLN
500.0000 mg | Freq: Four times a day (QID) | INTRAVENOUS | Status: DC | PRN
  Administered 2011-11-14: 500 mg via INTRAVENOUS
  Filled 2011-11-14 (×2): qty 5

## 2011-11-14 MED ORDER — TRANEXAMIC ACID 100 MG/ML IV SOLN
1970.0000 mg | INTRAVENOUS | Status: AC
  Administered 2011-11-14: 1970 mg via INTRAVENOUS
  Filled 2011-11-14: qty 19.7

## 2011-11-14 MED ORDER — MENTHOL 3 MG MT LOZG
1.0000 | LOZENGE | OROMUCOSAL | Status: DC | PRN
  Filled 2011-11-14: qty 9

## 2011-11-14 MED ORDER — SUCCINYLCHOLINE CHLORIDE 20 MG/ML IJ SOLN
INTRAMUSCULAR | Status: DC | PRN
  Administered 2011-11-14: 160 mg via INTRAVENOUS

## 2011-11-14 MED ORDER — PROPOFOL 10 MG/ML IV BOLUS
INTRAVENOUS | Status: DC | PRN
  Administered 2011-11-14: 170 mg via INTRAVENOUS

## 2011-11-14 MED ORDER — FENTANYL CITRATE 0.05 MG/ML IJ SOLN
50.0000 ug | INTRAMUSCULAR | Status: DC | PRN
  Administered 2011-11-14: 50 ug via INTRAVENOUS

## 2011-11-14 MED ORDER — OXYCODONE HCL 5 MG PO TABS
5.0000 mg | ORAL_TABLET | ORAL | Status: DC | PRN
  Administered 2011-11-15: 10 mg via ORAL
  Administered 2011-11-15 (×3): 5 mg via ORAL
  Administered 2011-11-15 – 2011-11-16 (×3): 10 mg via ORAL
  Filled 2011-11-14 (×2): qty 2
  Filled 2011-11-14 (×2): qty 1
  Filled 2011-11-14 (×2): qty 2
  Filled 2011-11-14: qty 1

## 2011-11-14 MED ORDER — SUCRALFATE 1 G PO TABS
2.0000 g | ORAL_TABLET | Freq: Two times a day (BID) | ORAL | Status: DC | PRN
  Filled 2011-11-14: qty 2

## 2011-11-14 MED ORDER — DEXAMETHASONE SODIUM PHOSPHATE 10 MG/ML IJ SOLN
INTRAMUSCULAR | Status: DC | PRN
  Administered 2011-11-14: 10 mg via INTRAVENOUS

## 2011-11-14 MED ORDER — DEXAMETHASONE SODIUM PHOSPHATE 10 MG/ML IJ SOLN: 10.0000 mg | Freq: Once | INTRAMUSCULAR | Status: DC

## 2011-11-14 MED ORDER — ROPIVACAINE HCL 5 MG/ML IJ SOLN
INTRAMUSCULAR | Status: DC | PRN
  Administered 2011-11-14: 30 mL

## 2011-11-14 MED ORDER — HYDROMORPHONE HCL PF 1 MG/ML IJ SOLN
INTRAMUSCULAR | Status: AC
  Administered 2011-11-14: 0.5 mg via INTRAVENOUS
  Filled 2011-11-14: qty 1

## 2011-11-14 MED ORDER — ALUM & MAG HYDROXIDE-SIMETH 200-200-20 MG/5ML PO SUSP: 30.0000 mL | ORAL | Status: DC | PRN

## 2011-11-14 MED ORDER — FERROUS SULFATE 325 (65 FE) MG PO TABS
325.0000 mg | ORAL_TABLET | Freq: Three times a day (TID) | ORAL | Status: DC
  Administered 2011-11-15 – 2011-11-17 (×7): 325 mg via ORAL
  Filled 2011-11-14 (×9): qty 1

## 2011-11-14 MED ORDER — ALISKIREN FUMARATE 150 MG PO TABS
300.0000 mg | ORAL_TABLET | Freq: Every day | ORAL | Status: DC
  Administered 2011-11-15 – 2011-11-17 (×3): 300 mg via ORAL
  Filled 2011-11-14 (×3): qty 2

## 2011-11-14 MED ORDER — SCOPOLAMINE 1 MG/3DAYS TD PT72
MEDICATED_PATCH | TRANSDERMAL | Status: AC
  Filled 2011-11-14: qty 1

## 2011-11-14 MED ORDER — POLYETHYLENE GLYCOL 3350 17 G PO PACK
17.0000 g | PACK | Freq: Two times a day (BID) | ORAL | Status: DC
  Administered 2011-11-15 – 2011-11-17 (×5): 17 g via ORAL
  Filled 2011-11-14 (×7): qty 1

## 2011-11-14 MED ORDER — ZOLPIDEM TARTRATE 5 MG PO TABS: 5.0000 mg | ORAL_TABLET | Freq: Every evening | ORAL | Status: DC | PRN

## 2011-11-14 MED ORDER — BUDESONIDE-FORMOTEROL FUMARATE 80-4.5 MCG/ACT IN AERO
2.0000 | INHALATION_SPRAY | Freq: Two times a day (BID) | RESPIRATORY_TRACT | Status: DC
  Filled 2011-11-14 (×2): qty 6.9

## 2011-11-14 MED ORDER — FENTANYL CITRATE 0.05 MG/ML IJ SOLN
INTRAMUSCULAR | Status: DC | PRN
  Administered 2011-11-14 (×2): 50 ug via INTRAVENOUS
  Administered 2011-11-14 (×2): 100 ug via INTRAVENOUS

## 2011-11-14 MED ORDER — BUPIVACAINE-EPINEPHRINE PF 0.25-1:200000 % IJ SOLN
INTRAMUSCULAR | Status: AC
  Filled 2011-11-14: qty 30

## 2011-11-14 MED ORDER — ONDANSETRON HCL 4 MG PO TABS: 4.0000 mg | ORAL_TABLET | Freq: Four times a day (QID) | ORAL | Status: DC | PRN

## 2011-11-14 MED ORDER — BUPIVACAINE-EPINEPHRINE PF 0.25-1:200000 % IJ SOLN
INTRAMUSCULAR | Status: DC | PRN
  Administered 2011-11-14: 30 mL

## 2011-11-14 MED ORDER — PANTOPRAZOLE SODIUM 40 MG PO TBEC
40.0000 mg | DELAYED_RELEASE_TABLET | Freq: Every day | ORAL | Status: DC
  Administered 2011-11-15 – 2011-11-17 (×3): 40 mg via ORAL
  Filled 2011-11-14 (×3): qty 1

## 2011-11-14 MED ORDER — RIVAROXABAN 10 MG PO TABS
10.0000 mg | ORAL_TABLET | ORAL | Status: DC
  Administered 2011-11-15 – 2011-11-16 (×2): 10 mg via ORAL
  Filled 2011-11-14 (×3): qty 1

## 2011-11-14 MED ORDER — CHLORHEXIDINE GLUCONATE 4 % EX LIQD: 60.0000 mL | Freq: Once | CUTANEOUS | Status: DC

## 2011-11-14 MED ORDER — ONDANSETRON HCL 4 MG/2ML IJ SOLN
4.0000 mg | Freq: Four times a day (QID) | INTRAMUSCULAR | Status: DC | PRN
  Administered 2011-11-14: 4 mg via INTRAVENOUS
  Filled 2011-11-14: qty 2

## 2011-11-14 MED ORDER — KETOROLAC TROMETHAMINE 30 MG/ML IJ SOLN
INTRAMUSCULAR | Status: AC
  Filled 2011-11-14: qty 1

## 2011-11-14 MED ORDER — PHENOL 1.4 % MT LIQD
1.0000 | OROMUCOSAL | Status: DC | PRN
  Filled 2011-11-14: qty 177

## 2011-11-14 MED ORDER — ACETAMINOPHEN 650 MG RE SUPP: 650.0000 mg | Freq: Four times a day (QID) | RECTAL | Status: DC | PRN

## 2011-11-14 MED ORDER — CEFAZOLIN SODIUM-DEXTROSE 2-3 GM-% IV SOLR
2.0000 g | Freq: Once | INTRAVENOUS | Status: AC
  Administered 2011-11-14: 2 g via INTRAVENOUS

## 2011-11-14 MED ORDER — ROPIVACAINE HCL 5 MG/ML IJ SOLN
INTRAMUSCULAR | Status: AC
  Filled 2011-11-14: qty 30

## 2011-11-14 MED ORDER — SCOPOLAMINE 1 MG/3DAYS TD PT72SCOPOLAMINE 1 MG/3DAYS
1.0000 | MEDICATED_PATCH | TRANSDERMAL | Status: DC
Start: 2011-11-14 — End: 2011-11-14
  Administered 2011-11-14: 1.5 mg via TRANSDERMAL
  Filled 2011-11-14: qty 1

## 2011-11-14 MED ORDER — ROCURONIUM BROMIDE 100 MG/10ML IV SOLN
INTRAVENOUS | Status: DC | PRN
  Administered 2011-11-14: 50 mg via INTRAVENOUS

## 2011-11-14 MED ORDER — ALISKIREN-HYDROCHLOROTHIAZIDE 300-12.5 MG PO TABS: 1.0000 | ORAL_TABLET | Freq: Every day | ORAL | Status: DC

## 2011-11-14 MED ORDER — SODIUM CHLORIDE 0.9 % IV SOLN
INTRAVENOUS | Status: DC
  Administered 2011-11-14 – 2011-11-15 (×2): via INTRAVENOUS
  Filled 2011-11-14 (×10): qty 1000

## 2011-11-14 MED ORDER — CEFAZOLIN SODIUM-DEXTROSE 2-3 GM-% IV SOLR
2.0000 g | Freq: Four times a day (QID) | INTRAVENOUS | Status: AC
  Administered 2011-11-14 – 2011-11-15 (×3): 2 g via INTRAVENOUS
  Filled 2011-11-14 (×3): qty 50

## 2011-11-14 MED ORDER — PROMETHAZINE HCL 25 MG/ML IJ SOLN
6.2500 mg | INTRAMUSCULAR | Status: DC | PRN
  Administered 2011-11-14: 6.25 mg via INTRAVENOUS

## 2011-11-14 MED ORDER — LACTATED RINGERS IV SOLN: INTRAVENOUS | Status: DC

## 2011-11-14 MED ORDER — METHOCARBAMOL 500 MG PO TABS
500.0000 mg | ORAL_TABLET | Freq: Four times a day (QID) | ORAL | Status: DC | PRN
  Administered 2011-11-15 – 2011-11-16 (×3): 500 mg via ORAL
  Filled 2011-11-14 (×3): qty 1

## 2011-11-14 MED ORDER — HYDROMORPHONE HCL PF 1 MG/ML IJ SOLN
INTRAMUSCULAR | Status: AC
  Administered 2011-11-14: 1 mg via INTRAVENOUS
  Filled 2011-11-14: qty 1

## 2011-11-14 MED ORDER — LACTATED RINGERS IV SOLN
INTRAVENOUS | Status: DC
  Administered 2011-11-14: 1000 mL via INTRAVENOUS
  Administered 2011-11-14: 18:00:00 via INTRAVENOUS

## 2011-11-14 MED ORDER — METOCLOPRAMIDE HCL 5 MG/ML IJ SOLN: 5.0000 mg | Freq: Three times a day (TID) | INTRAMUSCULAR | Status: DC | PRN

## 2011-11-14 MED ORDER — MIDAZOLAM HCL 2 MG/2ML IJ SOLN
INTRAMUSCULAR | Status: AC
  Filled 2011-11-14: qty 2

## 2011-11-14 MED ORDER — KETOROLAC TROMETHAMINE 30 MG/ML IJ SOLN
INTRAMUSCULAR | Status: DC | PRN
  Administered 2011-11-14: 30 mg via INTRAVENOUS

## 2011-11-14 MED ORDER — BISACODYL 5 MG PO TBEC: 5.0000 mg | DELAYED_RELEASE_TABLET | Freq: Every day | ORAL | Status: DC | PRN

## 2011-11-14 MED ORDER — LIDOCAINE HCL (CARDIAC) 20 MG/ML IV SOLN
INTRAVENOUS | Status: DC | PRN
  Administered 2011-11-14: 50 mg via INTRAVENOUS

## 2011-11-14 MED ORDER — ALBUTEROL SULFATE HFA 108 (90 BASE) MCG/ACT IN AERS
2.0000 | INHALATION_SPRAY | Freq: Four times a day (QID) | RESPIRATORY_TRACT | Status: DC | PRN
  Filled 2011-11-14: qty 6.7

## 2011-11-14 MED ORDER — MIDAZOLAM HCL 2 MG/2ML IJ SOLN
1.0000 mg | INTRAMUSCULAR | Status: DC | PRN
  Administered 2011-11-14: 1 mg via INTRAVENOUS

## 2011-11-14 MED ORDER — METOCLOPRAMIDE HCL 10 MG PO TABS: 5.0000 mg | ORAL_TABLET | Freq: Three times a day (TID) | ORAL | Status: DC | PRN

## 2011-11-14 MED ORDER — FLEET ENEMA 7-19 GM/118ML RE ENEM: 1.0000 | ENEMA | Freq: Once | RECTAL | Status: AC | PRN

## 2011-11-14 MED ORDER — PROMETHAZINE HCL 25 MG/ML IJ SOLN
INTRAMUSCULAR | Status: AC
  Filled 2011-11-14: qty 1

## 2011-11-14 MED ORDER — ONDANSETRON HCL 4 MG/2ML IJ SOLN
INTRAMUSCULAR | Status: DC | PRN
  Administered 2011-11-14: 4 mg via INTRAVENOUS

## 2011-11-14 MED ORDER — NEOSTIGMINE METHYLSULFATE 1 MG/ML IJ SOLN
INTRAMUSCULAR | Status: DC | PRN
  Administered 2011-11-14: 4 mg via INTRAVENOUS

## 2011-11-14 MED ORDER — GLYCOPYRROLATE 0.2 MG/ML IJ SOLN
INTRAMUSCULAR | Status: DC | PRN
  Administered 2011-11-14: .7 mg via INTRAVENOUS

## 2011-11-14 MED ORDER — FENTANYL CITRATE 0.05 MG/ML IJ SOLN
INTRAMUSCULAR | Status: AC
  Filled 2011-11-14: qty 2

## 2011-11-14 MED ORDER — DOCUSATE SODIUM 100 MG PO CAPS
100.0000 mg | ORAL_CAPSULE | Freq: Two times a day (BID) | ORAL | Status: DC
  Administered 2011-11-15 – 2011-11-17 (×5): 100 mg via ORAL
  Filled 2011-11-14 (×7): qty 1

## 2011-11-14 SURGICAL SUPPLY — 59 items
ADH SKN CLS APL DERMABOND .7 (GAUZE/BANDAGES/DRESSINGS) ×1
AUG TIB SZ2.5 5 REV STP WDG (Knees) ×2 IMPLANT
BAG SPEC THK2 15X12 ZIP CLS (MISCELLANEOUS) ×1
BAG ZIPLOCK 12X15 (MISCELLANEOUS) ×2 IMPLANT
BANDAGE ELASTIC 6 VELCRO ST LF (GAUZE/BANDAGES/DRESSINGS) ×2 IMPLANT
BANDAGE ESMARK 6X9 LF (GAUZE/BANDAGES/DRESSINGS) ×1 IMPLANT
BLADE SAW SGTL 13.0X1.19X90.0M (BLADE) ×2 IMPLANT
BNDG CMPR 9X6 STRL LF SNTH (GAUZE/BANDAGES/DRESSINGS) ×1
BNDG ESMARK 6X9 LF (GAUZE/BANDAGES/DRESSINGS) ×2
BOWL SMART MIX CTS (DISPOSABLE) ×2 IMPLANT
CEMENT HV SMART SET (Cement) ×1 IMPLANT
CLOTH BEACON ORANGE TIMEOUT ST (SAFETY) ×2 IMPLANT
CUFF TOURN SGL QUICK 34 (TOURNIQUET CUFF) ×2
CUFF TRNQT CYL 34X4X40X1 (TOURNIQUET CUFF) ×1 IMPLANT
DECANTER SPIKE VIAL GLASS SM (MISCELLANEOUS) ×2 IMPLANT
DERMABOND ADVANCED (GAUZE/BANDAGES/DRESSINGS) ×1
DERMABOND ADVANCED .7 DNX12 (GAUZE/BANDAGES/DRESSINGS) ×1 IMPLANT
DRAPE EXTREMITY T 121X128X90 (DRAPE) ×2 IMPLANT
DRAPE POUCH INSTRU U-SHP 10X18 (DRAPES) ×2 IMPLANT
DRAPE U-SHAPE 47X51 STRL (DRAPES) ×2 IMPLANT
DRSG AQUACEL AG ADV 3.5X10 (GAUZE/BANDAGES/DRESSINGS) ×2 IMPLANT
DRSG TEGADERM 4X4.75 (GAUZE/BANDAGES/DRESSINGS) ×2 IMPLANT
DURAPREP 26ML APPLICATOR (WOUND CARE) ×2 IMPLANT
ELECT REM PT RETURN 9FT ADLT (ELECTROSURGICAL) ×2
ELECTRODE REM PT RTRN 9FT ADLT (ELECTROSURGICAL) ×1 IMPLANT
EVACUATOR 1/8 PVC DRAIN (DRAIN) ×2 IMPLANT
FACESHIELD LNG OPTICON STERILE (SAFETY) ×10 IMPLANT
GAUZE SPONGE 2X2 8PLY STRL LF (GAUZE/BANDAGES/DRESSINGS) ×1 IMPLANT
GLOVE BIOGEL PI IND STRL 7.5 (GLOVE) ×1 IMPLANT
GLOVE BIOGEL PI IND STRL 8 (GLOVE) ×1 IMPLANT
GLOVE BIOGEL PI INDICATOR 7.5 (GLOVE) ×1
GLOVE BIOGEL PI INDICATOR 8 (GLOVE) ×1
GLOVE ECLIPSE 8.0 STRL XLNG CF (GLOVE) ×2 IMPLANT
GLOVE ORTHO TXT STRL SZ7.5 (GLOVE) ×4 IMPLANT
GOWN STRL NON-REIN LRG LVL3 (GOWN DISPOSABLE) ×2 IMPLANT
HANDPIECE INTERPULSE COAX TIP (DISPOSABLE) ×2
IMMOBILIZER KNEE 20 (SOFTGOODS) ×2
IMMOBILIZER KNEE 20 THIGH 36 (SOFTGOODS) IMPLANT
KIT BASIN OR (CUSTOM PROCEDURE TRAY) ×2 IMPLANT
MANIFOLD NEPTUNE II (INSTRUMENTS) ×2 IMPLANT
NDL SAFETY ECLIPSE 18X1.5 (NEEDLE) ×1 IMPLANT
NEEDLE HYPO 18GX1.5 SHARP (NEEDLE) ×2
NS IRRIG 1000ML POUR BTL (IV SOLUTION) ×4 IMPLANT
PACK TOTAL JOINT (CUSTOM PROCEDURE TRAY) ×2 IMPLANT
POSITIONER SURGICAL ARM (MISCELLANEOUS) ×2 IMPLANT
SET HNDPC FAN SPRY TIP SCT (DISPOSABLE) ×1 IMPLANT
SET PAD KNEE POSITIONER (MISCELLANEOUS) ×2 IMPLANT
SPONGE GAUZE 2X2 STER 10/PKG (GAUZE/BANDAGES/DRESSINGS) ×1
SUCTION FRAZIER 12FR DISP (SUCTIONS) ×2 IMPLANT
SUT MNCRL AB 4-0 PS2 18 (SUTURE) ×2 IMPLANT
SUT VIC AB 1 CT1 36 (SUTURE) ×6 IMPLANT
SUT VIC AB 2-0 CT1 27 (SUTURE) ×6
SUT VIC AB 2-0 CT1 TAPERPNT 27 (SUTURE) ×3 IMPLANT
SYR 50ML LL SCALE MARK (SYRINGE) ×2 IMPLANT
TOWEL OR 17X26 10 PK STRL BLUE (TOWEL DISPOSABLE) ×4 IMPLANT
TRAY FOLEY CATH 14FRSI W/METER (CATHETERS) ×2 IMPLANT
WATER STERILE IRR 1500ML POUR (IV SOLUTION) ×2 IMPLANT
WEDGE STEP SZ.5 5MM (Knees) ×2 IMPLANT
WRAP KNEE MAXI GEL POST OP (GAUZE/BANDAGES/DRESSINGS) ×2 IMPLANT

## 2011-11-14 NOTE — Interval H&P Note (Signed)
History and Physical Interval Note:  11/14/2011 4:14 PM  Alison Richards  has presented today for surgery, with the diagnosis of Osteoarthritis of the Right Knee  The various methods of treatment have been discussed with the patient and family. After consideration of risks, benefits and other options for treatment, the patient has consented to  Procedure(s): RIGHT TOTAL KNEE ARTHROPLASTY as a surgical intervention .  The patients' history has been reviewed, patient examined, no change in status, stable for surgery.  I have reviewed the patients' chart and labs.  Questions were answered to the patient's satisfaction.     [name]

## 2011-11-14 NOTE — Transfer of Care (Signed)
Immediate Anesthesia Transfer of Care Note  Patient: Alison Richards  Procedure(s) Performed:  TOTAL KNEE ARTHROPLASTY - combined with general block  Patient Location: PACU  Anesthesia Type: General  Level of Consciousness: sedated, patient cooperative and responds to stimulaton  Airway & Oxygen Therapy: Patient Spontanous Breathing and Patient connected to face mask oxgen  Post-op Assessment: Report given to PACU RN and Post -op Vital signs reviewed and stable  Post vital signs: Reviewed and stable  Complications: No apparent anesthesia complications   Filed Vitals:   11/14/11 1635  BP: 178/76  Pulse: 94  Temp:   Resp: 22

## 2011-11-14 NOTE — Progress Notes (Signed)
Placed patient on auto cpap with home nasal pillows and 3 liters of O2 bled in. Patient tolerating well, RT will monitor.

## 2011-11-14 NOTE — Anesthesia Procedure Notes (Signed)
Anesthesia Regional Block:  Femoral nerve block  Pre-Anesthetic Checklist: ,, timeout performed, Correct Patient, Correct Site, Correct Laterality, Correct Procedure, Correct Position, site marked, Risks and benefits discussed,  Surgical consent,  Pre-op evaluation,  At surgeon's request and post-op pain management  Laterality: Right and Lower  Prep: chloraprep       Needles:  Injection technique: Single-shot  Needle Type: Stimiplex      Needle Gauge: 21 G    Additional Needles:  Procedures: ultrasound guided and nerve stimulator Femoral nerve block  Nerve Stimulator or Paresthesia:  Response: 0.5 mA,   Additional Responses:   Narrative:  Injection made incrementally with aspirations every 5 mL.  Performed by: Personally  Anesthesiologist: Wesly Whisenant  Additional Notes: Risks, benefits and alternative to block explained extensively.  Patient tolerated procedure well, without complications.  Femoral Nerve Block  Supraclavicular block

## 2011-11-14 NOTE — Op Note (Signed)
NAME:  [facility]                      MEDICAL RECORD NO.:  [id number]                             FACILITY:  [facility]      PHYSICIAN:  Madlyn Frankel. Charlann Boxer, M.D.  DATE OF BIRTH:  08/10/1951      DATE OF PROCEDURE:  11/14/2011                                     OPERATIVE REPORT         PREOPERATIVE DIAGNOSIS:  Right knee osteoarthritis.      POSTOPERATIVE DIAGNOSIS:  1.  Right knee osteoarthritis. 2. Morbid Obesity      FINDINGS:  The patient was noted to have complete loss of cartilage and   bone-on-bone arthritis with associated osteophytes in the medial and patellofemoral compartments of   the knee with a significant synovitis and associated effusion.      PROCEDURE:  Right total knee replacement.      COMPONENTS USED:  DePuy rotating platform posterior stabilized knee   system, a size 3 femur, 2.5 MBT revision tibia tray with 5mm medial and lateral wedges, 12.5 mm insert, and 35 patellar   button.      SURGEON:  Madlyn Frankel. Charlann Boxer, M.D.      ASSISTANT:  Lanney Gins, PA-C.      ANESTHESIA:  Regional. Plus General     SPECIMENS:  None.      COMPLICATION:  None.      DRAINS:  One Hemovac.  EBL: 50cc      TOURNIQUET TIME:   Total Tourniquet Time Documented: Thigh (Right) - 41 minutes .      The patient was stable to the recovery room.      INDICATION FOR PROCEDURE:  Alison Richards is a 61 y.o. female patient of   mine.  The patient had been seen, evaluated, and treated conservatively in the   office with medication, activity modification, and injections.  The patient had   radiographic changes of bone-on-bone arthritis with endplate sclerosis and osteophytes noted.      The patient failed conservative measures including medication, injections, and activity modification, and at this point was ready for more definitive measures.   Based on the radiographic changes and failed conservative measures, the patient   decided to proceed with total knee  replacement.  Risks of infection,   DVT, component failure, need for revision surgery, postop course, and   expectations were all   discussed and reviewed.  Consent was obtained for benefit of pain   relief.      PROCEDURE IN DETAIL:  The patient was brought to the operative theater.   Once adequate anesthesia, preoperative antibiotics, 2 gm of Ancef administered, the patient was positioned supine with the right thigh tourniquet placed.  The  right lower extremity was prepped and draped in sterile fashion.  A time-   out was performed identifying the patient, planned procedure, and   extremity.      The right lower extremity was placed in the [facility] leg holder.  The leg was   exsanguinated, tourniquet elevated to 250 mmHg.  A midline incision was   made followed by median parapatellar arthrotomy.  Following initial   exposure, attention was first directed to the patella.  Precut   measurement was noted to be 21 mm.  I resected down to 14 mm and used a   35 patellar button to restore patellar height as well as cover the cut   surface.      The lug holes were drilled and a metal shim was placed to protect the   patella from retractors and saw blades.      At this point, attention was now directed to the femur.  The femoral   canal was opened with a drill, irrigated to try to prevent fat emboli.  An   intramedullary rod was passed at 3 degrees valgus, 10 mm of bone was   resected off the distal femur.  Following this resection, the tibia was   subluxated anteriorly.  Using the extramedullary guide, 10 mm of bone was resected off   the proximal latera; tibia.  I noted at this time that both the flexion and extension gaps were  Loose with the 10mm insert.  I did confirm that the tibial  cut was perpendicular in the coronal plane, checking with an alignment rod.      Once this was done, I sized the femur to be a size 3 in the anterior-   posterior dimension, chose a standard component  based on medial and   lateral dimension.  The size 3 rotation block was then pinned in   position anterior referenced using the C-clamp to set rotation.  The   anterior, posterior, and  chamfer cuts were made without difficulty nor   notching making certain that I was along the anterior cortex to help   with flexion gap stability.      The final box cut was made off the lateral aspect of distal femur.      At this point, the tibia was sized to be a size 2.5, the size 2.5 tray was   then pinned in position through the medial third of the tubercle,   drilled, and keel punched.  Trial reduction was now carried with a 3 femur,  2.5 tibia, a 15 mm insert, and the 35 patella botton.  With the knee in extension with the 15 insert I was worried out  Persistent hyperextension.  It was now that I decided to place a MBT revision tray with augments to Restore tibial anatomy and to help balance extension and flexion.   The patella   tracked through the trochlea without application of pressure.  Given   all these findings, the trial components removed.  Final components were   opened and cement was mixed.  The knee was irrigated with normal saline   solution and pulse lavage.  The synovial lining was   then injected with 0.25% Marcaine with epinephrine and 1 cc of Toradol,   total of 30cc.      The knee was irrigated.  Final implants were then cemented onto clean and   dried cut surfaces of bone with the knee brought to extension with a 12.5   mm trial insert.      Once the cement had fully cured, the excess cement was removed   throughout the knee.  I confirmed I was satisfied with the range of   motion and stability, and the final 12.5 mm insert was chosen.  It was   placed into the knee.      The tourniquet had been let down at  41 minutes.  No significant   hemostasis required.  The medium Hemovac drain was placed deep.  The   extensor mechanism was then reapproximated using #1 Vicryl with the  knee   in flexion.  The   remaining wound was closed with 2-0 Vicryl and running 4-0 Monocryl.   The knee was cleaned, dried, dressed sterilely using Dermabond and   Aquacel dressing.  Drain site dressed separately.  The patient was then   brought to recovery room in stable condition, tolerating the procedure   well.   Please note that Physician Assistant, Lanney Gins, was present for the entirety of the case, and was utilized for pre-operative positioning, peri-operative retractor management, general facilitation of the procedure.  He was also utilized for primary wound closure at the end of the case.              Madlyn Frankel Charlann Boxer, M.D.

## 2011-11-14 NOTE — H&P (View-Only) (Signed)
H&P done 11/01/11. Dictation # [id number]  

## 2011-11-14 NOTE — Anesthesia Postprocedure Evaluation (Signed)
  Anesthesia Post-op Note  Patient: Alison Richards  Procedure(s) Performed:  TOTAL KNEE ARTHROPLASTY - combined with general block  Patient Location: PACU  Anesthesia Type: GA combined with regional for post-op pain  Level of Consciousness: awake and alert   Airway and Oxygen Therapy: Patient Spontanous Breathing  Post-op Pain: mild  Post-op Assessment: Post-op Vital signs reviewed, Patient's Cardiovascular Status Stable, Respiratory Function Stable, Patent Airway and No signs of Nausea or vomiting  Post-op Vital Signs: stable  Complications: No apparent anesthesia complications

## 2011-11-14 NOTE — Anesthesia Preprocedure Evaluation (Signed)
Anesthesia Evaluation  Patient identified by MRN, date of birth, ID band Patient awake    Reviewed: Allergy & Precautions, H&P , NPO status , Patient's Chart, lab work & pertinent test results  History of Anesthesia Complications (+) PONV  Airway Mallampati: II TM Distance: >3 FB Neck ROM: Full    Dental No notable dental hx.    Pulmonary neg pulmonary ROS, shortness of breath, asthma , sleep apnea ,  clear to auscultation  Pulmonary exam normal       Cardiovascular hypertension, Pt. on medications neg cardio ROS Regular Normal    Neuro/Psych Negative Neurological ROS  Negative Psych ROS   GI/Hepatic negative GI ROS, Neg liver ROS, hiatal hernia, GERD-  Medicated and Controlled,  Endo/Other  Negative Endocrine ROS  Renal/GU negative Renal ROS  Genitourinary negative   Musculoskeletal negative musculoskeletal ROS (+)   Abdominal (+) obese,   Peds negative pediatric ROS (+)  Hematology negative hematology ROS (+)   Anesthesia Other Findings   Reproductive/Obstetrics negative OB ROS                           Anesthesia Physical Anesthesia Plan  ASA: III  Anesthesia Plan: General   Post-op Pain Management:    Induction: Intravenous  Airway Management Planned:   Additional Equipment:   Intra-op Plan:   Post-operative Plan: Extubation in OR  Informed Consent: I have reviewed the patients History and Physical, chart, labs and discussed the procedure including the risks, benefits and alternatives for the proposed anesthesia with the patient or authorized representative who has indicated his/her understanding and acceptance.   Dental advisory given  Plan Discussed with: CRNA  Anesthesia Plan Comments: (Had GA with FNB with other tka 5 years ago and would like the same today.)        Anesthesia Quick Evaluation

## 2011-11-15 LAB — BASIC METABOLIC PANEL
Calcium: 8.8 mg/dL (ref 8.4–10.5)
GFR calc Af Amer: 73 mL/min — ABNORMAL LOW (ref >90)
GFR calc non Af Amer: 63 mL/min — ABNORMAL LOW (ref >90)
Glucose, Bld: 136 mg/dL — ABNORMAL HIGH (ref 70–99)
Potassium: 4.5 mEq/L (ref 3.5–5.1)
Sodium: 139 mEq/L (ref 135–145)

## 2011-11-15 LAB — CBC
Hemoglobin: 12 g/dL (ref 12.0–15.0)
MCH: 29.3 pg (ref 26.0–34.0)
MCHC: 32.3 g/dL (ref 30.0–36.0)
RDW: 13.6 % (ref 11.5–15.5)

## 2011-11-15 MED ORDER — DIPHENHYDRAMINE HCL 50 MG/ML IJ SOLN
INTRAMUSCULAR | Status: AC
  Administered 2011-11-15: 25 mg
  Filled 2011-11-15: qty 1

## 2011-11-15 MED ORDER — HYDROCODONE-IBUPROFEN 7.5-200 MG PO TABS: 1.0000 | ORAL_TABLET | ORAL | Status: DC | PRN

## 2011-11-15 MED ORDER — DIPHENHYDRAMINE HCL 50 MG/ML IJ SOLN: 25.0000 mg | Freq: Four times a day (QID) | INTRAMUSCULAR | Status: DC | PRN

## 2011-11-15 NOTE — Progress Notes (Signed)
Physical Therapy Evaluation Patient Details Name: Alison Richards MRN: [id number] DOB: 19-Jan-1951 Today's Date: 11/15/2011 [id number] Ev 2  Problem List:  Patient Active Problem List  Diagnoses  . DEPRESSION, MAJOR, RECURRENT  . OBSTRUCTIVE SLEEP APNEA  . HYPERTENSION, BENIGN SYSTEMIC  . ASTHMA NOS W/ACUTE EXACERBATION  . BACK PAIN, LUMBAR  . LEG EDEMA, BILATERAL  . PALPITATIONS  . THORACIC STRAIN  . PHLEBITIS  . PAIN IN JOINT, MULTIPLE SITES  . FOOT PAIN, BILATERAL  . HIP PAIN, LEFT  . ECZEMA  . S/P right knee replacement    Past Medical History:  Past Medical History  Diagnosis Date  . Palpitations   . Orthostatic lightheadedness   . Pain in joint   . Hypertension   . Major depression   . Asthma     PAST HX OF ASTHMA WHILE TAKING LISINOPRIL--NO PROBLEMS SINCE DISCONTINUING LISINOPRIL  . Shortness of breath     WITH EXERTION-PT RELATES TO HER WEIGHT  . Obstructive sleep apnea     CPAP EVERY NIGHT-SETTING IS 8  . Blood transfusion     AGE 61 -HEMORRHAGED AFTER TONSILLECTOMY  . Protein in urine   . GERD (gastroesophageal reflux disease)     PAST HX  H-PYLORI   . H/O hiatal hernia   . Arthritis     PAIN AND OA RIGHT KNEE  . PONV (postoperative nausea and vomiting)    Past Surgical History:  Past Surgical History  Procedure Date  . Bladder surgery   . Tubal ligation 1979  . Cesarean section 1979  . Abdominal hysterectomy 1999  . Neck sx for ruptured disc c4-5 2001  . Tonsillectomy age 17  . Cholecystectomy 3-08    Dr [place]  . Left knee replacement 2008    PT Assessment/Plan/Recommendation PT Assessment Clinical Impression Statement: pt will benefit from PT to maximize independence for home PT Recommendation/Assessment: Patient will need skilled PT in the acute care venue PT Problem List: Decreased strength;Decreased range of motion;Decreased activity tolerance;Decreased balance;Decreased mobility;Decreased knowledge of precautions;Pain;Decreased  knowledge of use of DME Barriers to Discharge: None PT Therapy Diagnosis : Difficulty walking PT Plan PT Frequency: 7X/week PT Treatment/Interventions: DME instruction;Gait training;Stair training;Functional mobility training;Therapeutic activities;Therapeutic exercise;Patient/family education PT Recommendation Follow Up Recommendations: Home health PT Equipment Recommended:  (3in1 already ordered) PT Goals  Acute Rehab PT Goals PT Goal Formulation: With patient Time For Goal Achievement: 5 days Pt will go Supine/Side to Sit: with min assist PT Goal: Supine/Side to Sit - Progress: Goal set today Pt will go Sit to Supine/Side: with min assist PT Goal: Sit to Supine/Side - Progress: Goal set today Pt will go Sit to Stand: with supervision PT Goal: Sit to Stand - Progress: Goal set today Pt will go Stand to Sit: with supervision PT Goal: Stand to Sit - Progress: Goal set today Pt will Ambulate: 51 - 150 feet;with supervision;with rolling walker PT Goal: Ambulate - Progress: Goal set today Pt will Go Up / Down Stairs: 3-5 stairs;with min assist;with least restrictive assistive device;with rail(s) PT Goal: Up/Down Stairs - Progress: Goal set today Pt will Perform Home Exercise Program: with supervision, verbal cues required/provided PT Goal: Perform Home Exercise Program - Progress: Goal set today  PT Evaluation Precautions/Restrictions  Precautions Precautions: Knee Precaution Comments: no plillow under knee Knee Immobilizer: Discontinue once straight leg raise with < 10 degree lag Restrictions RLE Weight Bearing: Weight bearing as tolerated Prior Functioning  Home Living Lives With: Spouse Receives Help From: Family (husband) Type  of Home: House Home Layout: Multi-level Alternate Level Stairs-Rails:  (one rail) Alternate Level Stairs-Number of Steps: 5-6 Home Access: Stairs to enter Entrance Stairs-Rails: None Entrance Stairs-Number of Steps: 2 Home Adaptive Equipment:  Walker - rolling Additional Comments: [employer] getting a 3in1; pt loaned it and didn't get it back Prior Function Level of Independence: Independent with basic ADLs;Independent with homemaking with ambulation;Independent with gait;Independent with transfers Driving: Yes Cognition Cognition Arousal/Alertness: Awake/alert Overall Cognitive Status: Appears within functional limits for tasks assessed Orientation Level: Oriented X4 Sensation/Coordination Sensation Additional Comments: pt reports right thigh still "numb" Extremity Assessment RUE Assessment RUE Assessment: Within Functional Limits LUE Assessment LUE Assessment: Within Functional Limits RLE Assessment RLE Assessment:  (able to assist with SLR; ankle WFL) LLE Assessment LLE Assessment: Within Functional Limits (TKA 5 yrs ago) Mobility (including Balance) Bed Mobility Supine to Sit: 3: Mod assist Supine to Sit Details (indicate cue type and reason): with UB, RLE; cues for use of UEs and technique Sitting - Scoot to Edge of Bed: 4: Min assist Transfers Sit to Stand: 4: Min assist;From bed Sit to Stand Details (indicate cue type and reason): cues for hands and LE position Stand to Sit: 4: Min assist Stand to Sit Details: to control descent; cues for hands and LE position Ambulation/Gait Ambulation/Gait Assistance: 4: Min assist Ambulation/Gait Assistance Details (indicate cue type and reason): cues for sequence and use of UEs Ambulation Distance (Feet): 25 Feet Assistive device: Rolling walker Gait Pattern: Step-to pattern    Exercise  Total Joint Exercises Ankle Circles/Pumps: AROM;Both;10 reps Quad Sets: AROM;Right;5 reps End of Session PT - End of Session Equipment Utilized During Treatment: Gait belt;Right knee immobilizer Activity Tolerance: Patient tolerated treatment well Patient left: in chair;with family/visitor present;with call bell in reach Nurse Communication: Mobility status for  ambulation General Behavior During Session: [facility] for tasks performed Cognition: [facility] for tasks performed  [facility] 11/15/2011, 9:54 AM

## 2011-11-15 NOTE — Progress Notes (Signed)
CARE MANAGEMENT NOTE 11/15/2011  Patient:  Alison Richards, Alison Richards   Account Number:  [id number]  Date Initiated:  11/15/2011  Documentation initiated by:  Elroy Schembri  Subjective/Objective Assessment:   61 yo female admitted 11/14/11 with osteoarthritis left knee S/P Right Total Knee Arthroplasty     Action/Plan:   D/C when medically stable.   Anticipated DC Date:  11/18/2011   Anticipated DC Plan:  HOME W HOME HEALTH SERVICES      DC Planning Services  CM consult      PAC Choice  DURABLE MEDICAL EQUIPMENT  HOME HEALTH   Choice offered to / List presented to:  C-1 Patient   DME arranged  3-N-1      DME agency  [facility]     [facility] arranged  HH-2 PT      [facility] agency  [facility]   Status of service:  In process, will continue to follow  Comments:  11/15/11, Kathi Der RNC-MNN, BSN, [phone], CM received referral.  CM met with pt.  Pt states that her husband will be at home to assist her when she is discharged.  Pt states that she has used [place] for [facility] services before and wants to use them again.  Debbie at [place] contacted and [facility] services ordered and confirmed as noted above.  Will follow for any additional needs.  Pt has a walker and cane at home.

## 2011-11-15 NOTE — Progress Notes (Signed)
Pt was given Hydromorphone 1mg  IV at 9:39 am, pt began to feel a tightening in throat with shortness of breath, VS stable;98.7,115,18, 140/81 sitting, O2 sats at 99% on O2 2L/min via [place]. Benadryl 25mg  IV administered immediately  and issue was resolved. Hydromorphone added to pt's allergy list.

## 2011-11-15 NOTE — Progress Notes (Signed)
Occupational Therapy Evaluation Patient Details Name: Louna Rothgeb MRN: [id number] DOB: [birth year] Today's Date: 11/15/2011 EV1 [id number] Problem List:  Patient Active Problem List  Diagnoses  . DEPRESSION, MAJOR, RECURRENT  . OBSTRUCTIVE SLEEP APNEA  . HYPERTENSION, BENIGN SYSTEMIC  . ASTHMA NOS W/ACUTE EXACERBATION  . BACK PAIN, LUMBAR  . LEG EDEMA, BILATERAL  . PALPITATIONS  . THORACIC STRAIN  . PHLEBITIS  . PAIN IN JOINT, MULTIPLE SITES  . FOOT PAIN, BILATERAL  . HIP PAIN, LEFT  . ECZEMA  . S/P right knee replacement    Past Medical History:  Past Medical History  Diagnosis Date  . Palpitations   . Orthostatic lightheadedness   . Pain in joint   . Hypertension   . Major depression   . Asthma     PAST HX OF ASTHMA WHILE TAKING LISINOPRIL--NO PROBLEMS SINCE DISCONTINUING LISINOPRIL  . Shortness of breath     WITH EXERTION-PT RELATES TO HER WEIGHT  . Obstructive sleep apnea     CPAP EVERY NIGHT-SETTING IS 8  . Blood transfusion     AGE 30 -HEMORRHAGED AFTER TONSILLECTOMY  . Protein in urine   . GERD (gastroesophageal reflux disease)     PAST HX  H-PYLORI   . H/O hiatal hernia   . Arthritis     PAIN AND OA RIGHT KNEE  . PONV (postoperative nausea and vomiting)    Past Surgical History:  Past Surgical History  Procedure Date  . Bladder surgery   . Tubal ligation 1979  . Cesarean section 1979  . Abdominal hysterectomy 1999  . Neck sx for ruptured disc c4-5 2001  . Tonsillectomy age 64  . Cholecystectomy 3-08    Dr Kendrick [place]  . Left knee replacement 2008    OT Assessment/Plan/Recommendation OT Assessment Clinical Impression Statement: Pt w/ new TKR, POD # 1. All education completed. Pt will have necessary level of A upon d/c from husband. No f/u OT needed. OT Recommendation/Assessment: Patient does not need any further OT services OT Recommendation Follow Up Recommendations: No OT follow up Equipment Recommended: 3 in 1 bedside comode OT Goals    OT Evaluation Precautions/Restrictions  Precautions Precautions: Knee Precaution Comments: no plillow under knee Knee Immobilizer: Discontinue once straight leg raise with < 10 degree lag Restrictions Weight Bearing Restrictions: No RLE Weight Bearing: Weight bearing as tolerated Prior Functioning Home Living Lives With: Spouse Receives Help From: Family Type of Home: House Home Layout: Multi-level Alternate Level Stairs-Rails:  (one rail) Alternate Level Stairs-Number of Steps: 5-6 Home Access: Stairs to enter Entrance Stairs-Rails: None Entrance Stairs-Number of Steps: 2 Bathroom Shower/Tub: Psychologist, counselling;Door [employer] Toilet: Standard Bathroom Accessibility: Yes Home Adaptive Equipment: Built-in shower seat;Grab bars in shower;Walker - rolling Additional Comments: [employer] getting a 3in1; pt loaned it and didn't get it back Prior Function Level of Independence: Independent with basic ADLs;Independent with homemaking with ambulation;Independent with transfers;Needs assistance with gait Driving: Yes Vocation: Full time employment ADL ADL Eating/Feeding: Performed;Independent Where Assessed - Eating/Feeding: Chair Grooming: Simulated;Supervision/safety Where Assessed - Grooming: Standing at sink Upper Body Bathing: Simulated;Set up Where Assessed - Upper Body Bathing: Sitting, chair;Unsupported Lower Body Bathing: Minimal assistance Where Assessed - Lower Body Bathing: Sit to stand from chair Upper Body Dressing: Simulated;Set up Where Assessed - Upper Body Dressing: Sitting, chair;Unsupported Lower Body Dressing: Performed;Moderate assistance Where Assessed - Lower Body Dressing: Sit to stand from chair Toilet Transfer: Performed;Other (comment) (Minguard A w/VCs for hand placement, step sequence.) Toilet Transfer Method: Proofreader:  Raised toilet seat with arms (or 3-in-1 over toilet) Toileting - Clothing Manipulation: Performed;Minimal  assistance Where Assessed - Toileting Clothing Manipulation: Sit to stand from 3-in-1 or toilet Toileting - Hygiene: Performed;Minimal assistance Where Assessed - Toileting Hygiene: Sit to stand from 3-in-1 or toilet Tub/Shower Transfer: Not assessed Tub/Shower Transfer Method: Not assessed Equipment Used: Rolling walker;Other (comment) (3:1) Ambulation Related to ADLs: Pt fatigues quickly but verbalized that she knows she needs increased time and rest breaks when completing functional tasks. ADL Comments: Pt has walk in shower at home w/built in seat. Demo'd how to safely step into shower posteriorly w/RW. Pt verbalized understanding. Vision/Perception  Vision - History Baseline Vision: Wears glasses only for reading Patient Visual Report: No change from baseline Vision - Assessment Vision Assessment: Vision not tested Cognition Cognition Arousal/Alertness: Awake/alert Overall Cognitive Status: Appears within functional limits for tasks assessed Orientation Level: Oriented X4 Sensation/Coordination Sensation Additional Comments: pt reports right thigh still "numb" Extremity Assessment RUE Assessment RUE Assessment: Within Functional Limits LUE Assessment LUE Assessment: Within Functional Limits Mobility  Bed Mobility Bed Mobility: No Supine to Sit: 3: Mod assist Supine to Sit Details (indicate cue type and reason): with UB, RLE; cues for use of UEs and technique Sitting - Scoot to Edge of Bed: 4: Min assist Transfers Transfers: Yes Sit to Stand: From chair/3-in-1;With upper extremity assist (Minguared A w/RW) Sit to Stand Details (indicate cue type and reason): VCs for hand placement Stand to Sit: With upper extremity assist;To chair/3-in-1;Other (comment);With armrests (Minguard A w/RW) Stand to Sit Details: VCs for hand placement Exercises  End of Session OT - End of Session Equipment Utilized During Treatment: Gait belt;Other (comment) (RW, 3:1) Activity Tolerance:  Patient tolerated treatment well Patient left: in chair;with call bell in reach General Behavior During Session: [facility] for tasks performed Cognition: [facility] for tasks performed   Marcille Barman A, OTR/L [phone] 11/15/2011, 12:48 PM

## 2011-11-15 NOTE — Progress Notes (Signed)
Physical Therapy Treatment Patient Details Name: Alison Richards MRN: [id number] DOB: Apr 06, 1951 Today's Date: 11/15/2011 [address] [id number] PT Assessment/Plan  PT - Assessment/Plan PT Plan: Discharge plan remains appropriate;Frequency remains appropriate PT Frequency: 7X/week Follow Up Recommendations: Home health PT Equipment Recommended: 3 in 1 bedside comode PT Goals  Acute Rehab PT Goals PT Goal: Sit to Supine/Side - Progress: Progressing toward goal PT Goal: Sit to Stand - Progress: Progressing toward goal PT Goal: Stand to Sit - Progress: Progressing toward goal PT Goal: Perform Home Exercise Program - Progress: Progressing toward goal  PT Treatment Precautions/Restrictions  Precautions Precautions: Knee Precaution Comments: no plillow under knee Knee Immobilizer: Discontinue once straight leg raise with < 10 degree lag Restrictions Weight Bearing Restrictions: No RLE Weight Bearing: Weight bearing as tolerated Mobility (including Balance) Bed Mobility Bed Mobility: No Supine to Sit: 4: Min assist Supine to Sit Details (indicate cue type and reason): with RLE; cues for technique Transfers Sit to Stand: 4: Min assist;From chair/3-in-1;With upper extremity assist Sit to Stand Details (indicate cue type and reason): VCs for hand placement Stand to Sit: 4: Min assist;With upper extremity assist;To bed Stand to Sit Details: VCs hand placement Ambulation/Gait Ambulation/Gait:  (pivotal steps only chair to bed) Assistive device: Rolling walker Gait Pattern: Step-to pattern    Exercise  Total Joint Exercises Ankle Circles/Pumps: AROM;Both;10 reps Quad Sets: AROM;Both;10 reps Short Arc Quad: AAROM;AROM;Right;10 reps Heel Slides: AROM;AAROM;Right;10 reps Hip ABduction/ADduction: AROM;AAROM;Right;10 reps Straight Leg Raises: AROM;AAROM;Right;10 reps End of Session PT - End of Session Equipment Utilized During Treatment: Gait belt;Right knee immobilizer Activity  Tolerance: Patient tolerated treatment well General Behavior During Session: [facility] for tasks performed Cognition: [facility] for tasks performed  [facility] 11/15/2011, 2:07 PM

## 2011-11-15 NOTE — Progress Notes (Signed)
Subjective: 1 Day Post-Op Procedure(s) (LRB): TOTAL KNEE ARTHROPLASTY (Right)   Patient reports pain as mild. No events.   Objective:   VITALS:   Filed Vitals:   11/15/11 0500  BP: 131/75  Pulse: 90  Temp: 98.4 F (36.9 C)  Resp: 18    Neurovascular intact Dorsiflexion/Plantar flexion intact Incision: dressing C/D/I No cellulitis present Compartment soft  LABS  Basename 11/15/11 0530  HGB 12.0  HCT 37.1  WBC 15.6*  PLT 274     Basename 11/15/11 0530  NA 139  K 4.5  BUN 20  CREATININE 0.96  GLUCOSE 136*     Assessment/Plan: 1 Day Post-Op Procedure(s) (LRB): TOTAL KNEE ARTHROPLASTY (Right)   Foley cath D/C'ed HV drain D/C'ed Advance diet Up with therapy D/C IV fluids Discharge home with home health on Friday if continues to do well   Anastasio Auerbach. Iretta Mangrum   PAC  11/15/2011, 9:40 AM

## 2011-11-16 LAB — BASIC METABOLIC PANEL
Calcium: 9 mg/dL (ref 8.4–10.5)
Chloride: 102 mEq/L (ref 96–112)
Creatinine, Ser: 0.77 mg/dL (ref 0.50–1.10)
GFR calc Af Amer: >90 mL/min (ref >90)

## 2011-11-16 LAB — CBC
HCT: 34.5 % — ABNORMAL LOW (ref 36.0–46.0)
Hemoglobin: 11.2 g/dL — ABNORMAL LOW (ref 12.0–15.0)
MCHC: 32.5 g/dL (ref 30.0–36.0)
RBC: 3.81 MIL/uL — ABNORMAL LOW (ref 3.87–5.11)

## 2011-11-16 MED ORDER — KETOROLAC TROMETHAMINE 15 MG/ML IJ SOLN
15.0000 mg | Freq: Four times a day (QID) | INTRAMUSCULAR | Status: DC
Start: 2011-11-16 — End: 2011-11-17
  Administered 2011-11-16 – 2011-11-17 (×5): 15 mg via INTRAVENOUS
  Filled 2011-11-16 (×8): qty 1

## 2011-11-16 NOTE — Progress Notes (Signed)
Patient placed on auto mcpap with home nasal pillows 2lpm O2 bleed in . o2 sat 95% HR 97.

## 2011-11-16 NOTE — Progress Notes (Signed)
Subjective: 2 Days Post-Op Procedure(s) (LRB): TOTAL KNEE ARTHROPLASTY (Right)   Patient reports pain as moderate. No other events.  Objective:   VITALS:   Filed Vitals:   11/16/11 0540  BP: 138/73  Pulse: 74  Temp: 98.7 F (37.1 C)  Resp: 20    Neurovascular intact Dorsiflexion/Plantar flexion intact Incision: dressing C/D/I No cellulitis present Compartment soft  LABS  Basename 11/16/11 0510 11/15/11 0530  HGB 11.2* 12.0  HCT 34.5* 37.1  WBC 16.6* 15.6*  PLT 272 274     Basename 11/16/11 0510 11/15/11 0530  NA 138 139  K 4.0 4.5  BUN 15 20  CREATININE 0.77 0.96  GLUCOSE 128* 136*     Assessment/Plan: 2 Days Post-Op Procedure(s) (LRB): TOTAL KNEE ARTHROPLASTY (Right)  Adding Toradol for pain management Up with therapy Plan for discharge tomorrow if continuing to do well   Alison Richards. Reedy Biernat   PAC  11/16/2011, 10:46 AM

## 2011-11-16 NOTE — Progress Notes (Signed)
Physical Therapy Treatment Patient Details Name: Alison Richards MRN: [id number] DOB: September 14, 1951 Today's Date: 11/16/2011 1022-1047 2 gt PT Assessment/Plan  PT - Assessment/Plan Comments on Treatment Session: pt progressing well although limited by pain; RN aware; pt has wide RW, adjusted this to pt ht PT Plan: Discharge plan remains appropriate;Frequency remains appropriate PT Frequency: 7X/week Follow Up Recommendations: Home health PT Equipment Recommended: None recommended by PT PT Goals  Acute Rehab PT Goals PT Goal: Sit to Stand - Progress: Progressing toward goal PT Goal: Stand to Sit - Progress: Progressing toward goal PT Goal: Ambulate - Progress: Progressing toward goal  PT Treatment Precautions/Restrictions  Precautions Precautions: Knee Precaution Comments: discussed with pt; no pillow and guidelines regarding KI Required Braces or Orthoses: Yes Knee Immobilizer: Discontinue once straight leg raise with < 10 degree lag Restrictions Weight Bearing Restrictions: No RLE Weight Bearing: Weight bearing as tolerated Mobility (including Balance) Transfers Sit to Stand: 5: Supervision;With armrests Sit to Stand Details (indicate cue type and reason): cues for hands and LE placement Stand to Sit: 5: Supervision Stand to Sit Details: cues hands and LE position Ambulation/Gait Ambulation/Gait Assistance: 5: Supervision;4: Min assist Ambulation/Gait Assistance Details (indicate cue type and reason): cus sequence Ambulation Distance (Feet): 125 Feet Assistive device: Rolling walker Gait Pattern: Step-to pattern    Exercise  Total Joint Exercises Ankle Circles/Pumps: AROM;Both;10 reps Quad Sets:  (unable to continue ther ex d/t pain) End of Session PT - End of Session Equipment Utilized During Treatment: Gait belt;Right knee immobilizer Activity Tolerance: Patient limited by pain Patient left: in chair;with call bell in reach;with family/visitor present Nurse  Communication: Mobility status for transfers;Mobility status for ambulation General Behavior During Session: [facility] for tasks performed Cognition: [facility] for tasks performed  [facility] 11/16/2011, 11:09 AM

## 2011-11-17 ENCOUNTER — Encounter (HOSPITAL_COMMUNITY): Payer: Self-pay | Admitting: Orthopedic Surgery

## 2011-11-17 MED ORDER — DSS 100 MG PO CAPS: 100.0000 mg | ORAL_CAPSULE | Freq: Two times a day (BID) | ORAL | Status: AC

## 2011-11-17 MED ORDER — POLYETHYLENE GLYCOL 3350 17 G PO PACK: 17.0000 g | PACK | Freq: Two times a day (BID) | ORAL | Status: AC

## 2011-11-17 MED ORDER — OXYCODONE HCL 5 MG PO TABS: 5.0000 mg | ORAL_TABLET | ORAL | Status: AC | PRN

## 2011-11-17 MED ORDER — ASPIRIN EC 325 MG PO TBEC: 325.0000 mg | DELAYED_RELEASE_TABLET | Freq: Two times a day (BID) | ORAL | Status: AC

## 2011-11-17 MED ORDER — METHOCARBAMOL 500 MG PO TABS: 500.0000 mg | ORAL_TABLET | Freq: Four times a day (QID) | ORAL | Status: AC | PRN

## 2011-11-17 MED ORDER — FERROUS SULFATE 325 (65 FE) MG PO TABS: 325.0000 mg | ORAL_TABLET | Freq: Three times a day (TID) | ORAL | Status: DC

## 2011-11-17 MED ORDER — DIPHENHYDRAMINE HCL 25 MG PO CAPS: 25.0000 mg | ORAL_CAPSULE | Freq: Four times a day (QID) | ORAL | Status: AC | PRN

## 2011-11-17 NOTE — Progress Notes (Signed)
Physical Therapy Treatment Patient Details Name: Alison Richards MRN: [id number] DOB: 1950/11/11 Today's Date: 11/17/2011  R TKR POD #3 13:45 - 14:00 Stair training 1 gt  PT Assessment/Plan Spouse present and instructed/demonstrated stair training  PT Treatment Assisted pt up 2 steps forward @ min guard assist using one rail and one crutch with 25% VC's on proper technique and safety.  Also, instructed pt on proper in/out shower. Instructed spouse on D/C of KI and frequent use of ICE to R Knee. No further questions.   Pt very knowledgable having had L TKR approx 5 years ago.  Felecia Shelling  PTA [facility]  Acute  Rehab Pager     [phone]

## 2011-11-17 NOTE — Progress Notes (Signed)
Physical Therapy Treatment Patient Details Name: Nakayla Rorabaugh MRN: [id number] DOB: 24-Jun-1951 Today's Date: 11/17/2011  R TKR 11:45 - 12:10 1 gt  1 ta  PT Assessment/Plan  PT - Assessment/Plan Comments on Treatment Session: "felt good to walk" PT Plan: Discharge plan remains appropriate Follow Up Recommendations: Home health PT Equipment Recommended: None recommended by PT (Pt has equipment from prior TKR) PT Goals  Acute Rehab PT Goals PT Goal Formulation: With patient Pt will go Supine/Side to Sit: with min assist PT Goal: Supine/Side to Sit - Progress: Progressing toward goal Pt will go Sit to Supine/Side: with min assist PT Goal: Sit to Supine/Side - Progress: Progressing toward goal Pt will go Sit to Stand: with supervision PT Goal: Sit to Stand - Progress: Met Pt will go Stand to Sit: with supervision PT Goal: Stand to Sit - Progress: Met Pt will Ambulate: 51 - 150 feet;with supervision;with rolling walker PT Goal: Ambulate - Progress: Met Pt will Go Up / Down Stairs: 3-5 stairs;with min assist;with least restrictive assistive device PT Goal: Up/Down Stairs - Progress: Progressing toward goal Pt will Perform Home Exercise Program: with supervision, verbal cues required/provided PT Goal: Perform Home Exercise Program - Progress: Progressing toward goal  PT Treatment Precautions/Restrictions  D/C KI.....able to perform SLR WBAT  Mobility (including Balance) Bed Mobility Bed Mobility: No Transfers Transfers: Yes Sit to Stand: 6: Modified independent (Device/Increase time) Sit to Stand Details (indicate cue type and reason): "Easier to get up without the brace' Stand to Sit: 6: Modified independent (Device/Increase time) Stand to Sit Details: good safety technique Ambulation/Gait Ambulation/Gait: Yes Ambulation/Gait Assistance: 5: Supervision Ambulation/Gait Assistance Details (indicate cue type and reason): increased time and 25% VC's to increase WBing  thru R LE to decrease limp and equal stance time Ambulation Distance (Feet): 85 Feet Assistive device: Rolling walker Gait Pattern: Step-to pattern;Trunk flexed Gait velocity: 25% VC's on safety with turns and to increase WBing thru R LE Stairs: No Wheelchair Mobility Wheelchair Mobility: No    Exercise   End of Session PT - End of Session Equipment Utilized During Treatment: Gait belt Activity Tolerance: Patient tolerated treatment well Patient left: in chair;with call bell in reach Nurse Communication: Mobility status for ambulation General Behavior During Session: [facility] for tasks performed Cognition: [facility] for tasks performed  Felecia Shelling  PTA [facility]  Acute  Rehab Pager     [phone]

## 2011-11-17 NOTE — Progress Notes (Signed)
Physical Therapy Treatment Patient Details Name: Alison Richards MRN: [id number] DOB: Dec 13, 1950 Today's Date: 11/17/2011  R TKR POD #3 9:30 - 9:55 1 tg  1 te  PT Assessment/Plan  PT - Assessment/Plan Comments on Treatment Session: Pt hopes to take a shower today. Poss D/C to home today PT Plan: Discharge plan remains appropriate Follow Up Recommendations: Home health PT Equipment Recommended: None recommended by PT (Pt has equipment from prior TKR) PT Goals  Acute Rehab PT Goals PT Goal Formulation: With patient Pt will go Supine/Side to Sit: with min assist PT Goal: Supine/Side to Sit - Progress: Progressing toward goal Pt will go Sit to Supine/Side: with min assist PT Goal: Sit to Supine/Side - Progress: Progressing toward goal Pt will go Sit to Stand: with supervision PT Goal: Sit to Stand - Progress: Progressing toward goal Pt will go Stand to Sit: with supervision PT Goal: Stand to Sit - Progress: Progressing toward goal Pt will Ambulate: 51 - 150 feet;with supervision;with rolling walker PT Goal: Ambulate - Progress: Progressing toward goal Pt will Go Up / Down Stairs: 3-5 stairs;with min assist;with least restrictive assistive device PT Goal: Up/Down Stairs - Progress: Progressing toward goal Pt will Perform Home Exercise Program: with supervision, verbal cues required/provided PT Goal: Perform Home Exercise Program - Progress: Progressing toward goal  PT Treatment Precautions/Restrictions  Precautions Precautions: Knee Precaution Comments: pt able to perform 10 actice SLR - D/C KI and instructed pt on such Required Braces or Orthoses: Yes Knee Immobilizer: Discontinue once straight leg raise with < 10 degree lag Restrictions Weight Bearing Restrictions: No RLE Weight Bearing: Weight bearing as tolerated Mobility (including Balance) Bed Mobility Bed Mobility: No (pt OOB in bathroom) Transfers Transfers: Yes Sit to Stand: 5: Supervision Sit to Stand  Details (indicate cue type and reason): increased time Stand to Sit: 5: Supervision Stand to Sit Details: increased time and min assist to support R LE Ambulation/Gait Ambulation/Gait: Yes Ambulation/Gait Assistance: 5: Supervision Ambulation/Gait Assistance Details (indicate cue type and reason): amb from bathrrom to recliner Ambulation Distance (Feet): 12 Feet Assistive device: Rolling walker Gait Pattern: Step-to pattern Gait velocity: 25% VC's on safety with turns and to increase WBing thru R LE Stairs: No Wheelchair Mobility Wheelchair Mobility: No    Exercise  Pt given handout on TKR TE HEP Total Joint Exercises Ankle Circles/Pumps: AROM;Both;10 reps Quad Sets: AROM;Both;10 reps Gluteal Sets: AROM;Both;10 reps Towel Squeeze: AROM;Both;10 reps Short Arc Quad: Right;10 reps;AROM Heel Slides: AAROM;Right;10 reps Hip ABduction/ADduction: 10 reps;AROM Straight Leg Raises: AROM;Right;10 reps End of Session PT - End of Session Equipment Utilized During Treatment: Gait belt Activity Tolerance: Patient limited by fatigue Patient left: in chair;with call bell in reach Nurse Communication: Mobility status for ambulation General Behavior During Session: [facility] for tasks performed Cognition: [facility] for tasks performed  Felecia Shelling  PTA [facility]  Acute  Rehab Pager     [phone]

## 2011-11-17 NOTE — Progress Notes (Signed)
Patient ID: Alison Richards, female   DOB: Jan 28, 1951, 61 y.o.   MRN: [id number] Subjective: 3 Days Post-Op Procedure(s) (LRB): TOTAL KNEE ARTHROPLASTY (Right)    Patient reports pain as mild.  Objective:   VITALS:   Filed Vitals:   11/17/11 0728  BP: 111/70  Pulse: 95  Temp: 97.2 F (36.2 C)  Resp: 20    Neurovascular intact Incision: dressing C/D/I Compartment soft  LABS  Basename 11/16/11 0510 11/15/11 0530  HGB 11.2* 12.0  HCT 34.5* 37.1  WBC 16.6* 15.6*  PLT 272 274     Basename 11/16/11 0510 11/15/11 0530  NA 138 139  K 4.0 4.5  BUN 15 20  CREATININE 0.77 0.96  GLUCOSE 128* 136*    No results found for this basename: LABPT:2,INR:2 in the last 72 hours   Assessment/Plan: 3 Days Post-Op Procedure(s) (LRB): TOTAL KNEE ARTHROPLASTY (Right)   Up with therapy Discharge home with home health

## 2011-11-19 NOTE — Discharge Summary (Signed)
Physician Discharge Summary  Patient ID: Alison Richards MRN: [id number] DOB/AGE: 1951/09/14 61 y.o.  Admit date: 11/14/2011 Discharge date: 11/17/2011  Procedures:  Procedure(s) (LRB): TOTAL KNEE ARTHROPLASTY (Right)  Attending Physician: Dr. Charlann Boxer  Admission Diagnoses: Osteoarthritis, right knee.  Discharge Diagnoses:  Principal Problem:  *S/P right knee replacement Sleep apnea Hypertension Hiatal hernia Degenerative disk disease of the C-spine  HPI: This is a 61 year old lady with a history of total knee arthroplasty on the left knee. She has done well, who has osteoarthritis on her right knee, which has failed conservative treatment. She is now scheduled for total knee arthroplasty of the right knee. The surgery, risks, benefits, and aftercare were discussed in detail with the patient. Questions invited and answered.  PCP: Nani Gasser, MD, MD   Discharged Condition: good  Hospital Course:  Patient underwent the above stated procedure on 11/14/2011. Patient tolerated the procedure well and brought to the recovery room in good condition and subsequently to the floor.  POD #1 BP: 131/75 ; Pulse: 90 ; Temp: 98.4 F (36.9 C) ; Resp: 18  Pt's foley was removed, as well as the hemovac drain removed. IV was changed to a saline lock. Patient reports pain as mild. No events. Neurovascular intact, dorsiflexion/plantar flexion intact, incision: dressing C/D/I, no cellulitis present and compartment soft.  LABS  Basename  11/15/11 0530   HGB  12.0   HCT  37.1    POD #2  BP: 138/73 ; Pulse: 74 ; Temp: 98.7 F (37.1 C) ; Resp: 20  Patient reports pain as moderate. No other events Neurovascular intact, dorsiflexion/plantar flexion intact, incision: dressing C/D/I, no cellulitis present and compartment soft.  LABS  Basename  11/16/11 0510    HGB  11.2*    HCT  34.5*      POD #3  BP: 111/70 ; Pulse: 95 ; Temp: 97.2 F (36.2 C) ; Resp: 20  Patient reports pain as  mild. Ready to be discharged home. Neurovascular intact, incision: dressing C/D/I and compartment soft.  LABS  No new labs   Discharge Exam: Extremities: Homans sign is negative, no sign of DVT, no edema, redness or tenderness in the calves or thighs and no ulcers, gangrene or trophic changes  Disposition: Home-Health Care Svc. Follow up in 2 weeks at [facility]  Discharge Orders    Future Orders Please Complete By Expires   Diet - low sodium heart healthy      Call MD / Call 911      Comments:   If you experience chest pain or shortness of breath, CALL 911 and be transported to the hospital emergency room.  If you develope a fever above 101 F, pus (white drainage) or increased drainage or redness at the wound, or calf pain, call your surgeon's office.   Discharge instructions      Comments:   Maintain surgical dressing for 8 days, then replace with gauze and tape. Keep the area dry and clean until follow up. Follow up in 2 weeks at [facility]. Call with any questions or concerns.     Constipation Prevention      Comments:   Drink plenty of fluids.  Prune juice may be helpful.  You may use a stool softener, such as Colace (over the counter) 100 mg twice a day.  Use MiraLax (over the counter) for constipation as needed.   Increase activity slowly as tolerated      Weight Bearing as taught in Physical Therapy  Comments:   Use a walker or crutches as instructed.   Driving restrictions      Comments:   No driving for 4 weeks   TED hose      Comments:   Use stockings (TED hose) for 2 weeks on both leg(s).  You may remove them at night for sleeping.   Change dressing      Comments:   Maintain surgical dressing for 8 days, then change the dressing daily with sterile 4 x 4 inch gauze dressing and tape. Keep the area dry and clean.      Discharge Medication List as of 11/17/2011  1:27 PM    START taking these medications   Details  aspirin EC 325 MG  tablet Take 1 tablet (325 mg total) by mouth 2 (two) times daily. X 4 weeks, Starting 11/17/2011, Until Mon 11/27/11, No Print    diphenhydrAMINE (BENADRYL) 25 mg capsule Take 1 capsule (25 mg total) by mouth every 6 (six) hours as needed for itching, allergies or sleep., Starting 11/17/2011, Until Mon 11/27/11, No Print    docusate sodium 100 MG CAPS Take 100 mg by mouth 2 (two) times daily., Starting 11/17/2011, Until Mon 11/27/11, No Print    ferrous sulfate 325 (65 FE) MG tablet Take 1 tablet (325 mg total) by mouth 3 (three) times daily after meals., Starting 11/17/2011, Until Sat 11/16/12, No Print    methocarbamol (ROBAXIN) 500 MG tablet Take 1 tablet (500 mg total) by mouth every 6 (six) hours as needed (muscle spasms)., Starting 11/17/2011, Until Mon 11/27/11, No Print    oxyCODONE (OXY IR/ROXICODONE) 5 MG immediate release tablet Take 1-2 tablets (5-10 mg total) by mouth every 3 (three) hours as needed., Starting 11/17/2011, Until Mon 11/27/11, Print    polyethylene glycol (MIRALAX / GLYCOLAX) packet Take 17 g by mouth 2 (two) times daily., Starting 11/17/2011, Until Mon 11/20/11, No Print      CONTINUE these medications which have NOT CHANGED   Details  Aliskiren-Hydrochlorothiazide (TEKTURNA HCT) 300-12.5 MG TABS Take 1 tablet by mouth daily before breakfast. , Until Discontinued, Historical Med    pantoprazole (PROTONIX) 40 MG tablet Take 1 tablet (40 mg total) by mouth daily., Starting 08/17/2011, Until Fri 08/16/12, Normal    sucralfate (CARAFATE) 1 G tablet Take 2 g by mouth 2 (two) times daily as needed. Pain , Starting 08/17/2011, Until Fri 08/16/12, Historical Med      STOP taking these medications     diclofenac sodium (VOLTAREN) 1 % GEL Comments:  Reason for Stopping:       meloxicam (MOBIC) 7.5 MG tablet Comments:  Reason for Stopping:       naproxen sodium (ANAPROX) 220 MG tablet Comments:  Reason for Stopping:            Signed: Anastasio Auerbach. Floreen Teegarden    PAC  11/19/2011, 12:32 PM

## 2011-11-24 ENCOUNTER — Encounter: Payer: Self-pay | Admitting: *Deleted

## 2011-11-24 ENCOUNTER — Ambulatory Visit (INDEPENDENT_AMBULATORY_CARE_PROVIDER_SITE_OTHER): Payer: BC Managed Care – PPO | Admitting: Family Medicine

## 2011-11-24 VITALS — BP 117/72 | HR 94 | Temp 98.3°F | Wt 281.0 lb

## 2011-11-24 DIAGNOSIS — M545 Low back pain, unspecified: Secondary | ICD-10-CM

## 2011-11-24 NOTE — Progress Notes (Signed)
  Subjective:    Patient ID: Alison Richards, female    DOB: [birth year], 61 y.o.   MRN: [id number] UA. Pt c/o lower back pain, odor to urine and low pelvic pain, low grade temp at home100.0. Pt could not urinate- took cup home and will try- has HHA that comes in home that I informed her they could test it HPI    Review of Systems     Objective:   Physical Exam        Assessment & Plan:  Will await urinalysis. /cm

## 2011-11-27 ENCOUNTER — Encounter: Payer: Self-pay | Admitting: *Deleted

## 2011-12-07 ENCOUNTER — Ambulatory Visit: Payer: BC Managed Care – PPO | Attending: Orthopedic Surgery | Admitting: Physical Therapy

## 2011-12-07 DIAGNOSIS — R269 Unspecified abnormalities of gait and mobility: Secondary | ICD-10-CM | POA: Insufficient documentation

## 2011-12-07 DIAGNOSIS — M25669 Stiffness of unspecified knee, not elsewhere classified: Secondary | ICD-10-CM | POA: Insufficient documentation

## 2011-12-07 DIAGNOSIS — Z96669 Presence of unspecified artificial ankle joint: Secondary | ICD-10-CM | POA: Insufficient documentation

## 2011-12-07 DIAGNOSIS — IMO0001 Reserved for inherently not codable concepts without codable children: Secondary | ICD-10-CM | POA: Insufficient documentation

## 2011-12-12 ENCOUNTER — Ambulatory Visit: Payer: BC Managed Care – PPO | Admitting: Physical Therapy

## 2011-12-14 ENCOUNTER — Encounter: Payer: BC Managed Care – PPO | Admitting: Physical Therapy

## 2011-12-20 ENCOUNTER — Ambulatory Visit (INDEPENDENT_AMBULATORY_CARE_PROVIDER_SITE_OTHER): Payer: BC Managed Care – PPO | Admitting: Obstetrics & Gynecology

## 2011-12-20 ENCOUNTER — Encounter: Payer: Self-pay | Admitting: Obstetrics & Gynecology

## 2011-12-20 VITALS — BP 133/76 | HR 97 | Temp 98.4°F | Resp 16 | Ht 65.0 in | Wt 282.0 lb

## 2011-12-20 DIAGNOSIS — Z01419 Encounter for gynecological examination (general) (routine) without abnormal findings: Secondary | ICD-10-CM

## 2011-12-20 DIAGNOSIS — Z0000 Encounter for general adult medical examination without abnormal findings: Secondary | ICD-10-CM

## 2011-12-20 NOTE — Progress Notes (Signed)
Subjective:    Alison Richards is a 61 y.o. female who presents for annual exam. She complains of a smelly vaginal discharge for about 5 weeks, since she had knee surgery. The patient is rarely sexually active. GYN screening history: last pap: was normal. The patient is not taking hormone replacement therapy. Patient denies post-menopausal vaginal bleeding.. The patient wears seatbelts: yes. The patient participates in regular exercise: no. Has the patient ever been transfused or tattooed?: yes. (transfused) The patient reports that there is not domestic violence in her life.   Menstrual History: OB History    Grav Para Term Preterm Abortions TAB SAB Ect Mult Living                  Menarche age: 2 No LMP recorded. Patient is postmenopausal.    The following portions of the patient's history were reviewed and updated as appropriate: allergies, current medications, past family history, past medical history, past social history, past surgical history and problem list.  Review of Systems Pertinent items are noted in HPI. 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 Her mammogram is UTD. She has mixed incontinence. She had a "bladder tack" in the 1990s by a urologist. She wears pads daily.   Objective:    BP 133/76  Pulse 97  Temp(Src) 98.4 F (36.9 C) (Oral)  Resp 16  Ht 5\' 5"  (1.651 m)  Wt 282 lb (127.914 kg)  BMI 46.93 kg/m2  General Appearance:    Alert, cooperative, no distress, appears stated age  Head:    Normocephalic, without obvious abnormality, atraumatic  Eyes:    PERRL, conjunctiva/corneas clear, EOM's intact, fundi    benign, both eyes  Ears:    Normal TM's and external ear canals, both ears  Nose:   Nares normal, septum midline, mucosa normal, no drainage    or sinus tenderness  Throat:   Lips, mucosa, and tongue normal; teeth and gums normal  Neck:   Supple, symmetrical, trachea midline, no adenopathy;   thyroid:  no enlargement/tenderness/nodules; no carotid   bruit or JVD  Back:     Symmetric, no curvature, ROM normal, no CVA tenderness  Lungs:     Clear to auscultation bilaterally, respirations unlabored  Chest Wall:    No tenderness or deformity   Heart:    Regular rate and rhythm, S1 and S2 normal, no murmur, rub   or gallop  Breast Exam:    No tenderness, masses, or nipple abnormality  Abdomen:     Soft, non-tender, bowel sounds active all four quadrants,    no masses, no organomegaly  Genitalia:    Normal female without lesion, discharge or tenderness, no palpable adnexal masses, wet prep sent.     Extremities:   Extremities normal, atraumatic, no cyanosis or edema  Pulses:   2+ and symmetric all extremities  Skin:   Skin color, texture, turgor normal, no rashes or lesions  Lymph nodes:   Cervical, supraclavicular, and axillary nodes normal  Neurologic:   CNII-XII intact, normal strength, sensation and reflexes    throughout      Assessment:    Normal gyn exam    Plan:    Wet prep.

## 2011-12-21 ENCOUNTER — Ambulatory Visit: Payer: BC Managed Care – PPO | Admitting: Physical Therapy

## 2011-12-22 ENCOUNTER — Telehealth: Payer: Self-pay | Admitting: *Deleted

## 2011-12-22 ENCOUNTER — Other Ambulatory Visit: Payer: Self-pay | Admitting: *Deleted

## 2011-12-22 DIAGNOSIS — B373 Candidiasis of vulva and vagina: Secondary | ICD-10-CM

## 2011-12-22 MED ORDER — FLUCONAZOLE 150 MG PO TABS: 150.0000 mg | ORAL_TABLET | Freq: Once | ORAL | Status: AC

## 2011-12-22 NOTE — Telephone Encounter (Signed)
Pt notified of positive yeast on wet prep and RX for Diflucan 150mg  sent into [facility] in [place].

## 2011-12-28 ENCOUNTER — Ambulatory Visit: Payer: BC Managed Care – PPO | Admitting: Physical Therapy

## 2012-04-23 ENCOUNTER — Ambulatory Visit: Payer: BC Managed Care – PPO | Admitting: Family Medicine

## 2012-04-24 ENCOUNTER — Ambulatory Visit (INDEPENDENT_AMBULATORY_CARE_PROVIDER_SITE_OTHER): Payer: BC Managed Care – PPO | Admitting: Physician Assistant

## 2012-04-24 ENCOUNTER — Encounter: Payer: Self-pay | Admitting: Physician Assistant

## 2012-04-24 ENCOUNTER — Ambulatory Visit
Admission: RE | Admit: 2012-04-24 | Discharge: 2012-04-24 | Disposition: A | Discharge status: Home / Self Care | Payer: BC Managed Care – PPO | Source: Ambulatory Visit | Attending: Physician Assistant | Admitting: Physician Assistant

## 2012-04-24 VITALS — BP 123/69 | HR 98 | Ht 64.0 in | Wt 285.0 lb

## 2012-04-24 DIAGNOSIS — M1990 Unspecified osteoarthritis, unspecified site: Secondary | ICD-10-CM

## 2012-04-24 DIAGNOSIS — R5383 Other fatigue: Secondary | ICD-10-CM

## 2012-04-24 DIAGNOSIS — R0602 Shortness of breath: Secondary | ICD-10-CM

## 2012-04-24 DIAGNOSIS — R413 Other amnesia: Secondary | ICD-10-CM

## 2012-04-24 DIAGNOSIS — R05 Cough: Secondary | ICD-10-CM

## 2012-04-24 DIAGNOSIS — Z23 Encounter for immunization: Secondary | ICD-10-CM

## 2012-04-24 DIAGNOSIS — K219 Gastro-esophageal reflux disease without esophagitis: Secondary | ICD-10-CM

## 2012-04-24 DIAGNOSIS — K3189 Other diseases of stomach and duodenum: Secondary | ICD-10-CM

## 2012-04-24 LAB — CBC WITH DIFFERENTIAL/PLATELET
Eosinophils Absolute: 0.3 10*3/uL (ref 0.0–0.7)
Eosinophils Relative: 3 % (ref 0–5)
HCT: 40.4 % (ref 36.0–46.0)
Hemoglobin: 13.7 g/dL (ref 12.0–15.0)
Lymphs Abs: 3.1 10*3/uL (ref 0.7–4.0)
MCH: 29 pg (ref 26.0–34.0)
MCV: 85.4 fL (ref 78.0–100.0)
Monocytes Absolute: 0.5 10*3/uL (ref 0.1–1.0)
Monocytes Relative: 6 % (ref 3–12)
RBC: 4.73 MIL/uL (ref 3.87–5.11)

## 2012-04-24 LAB — TSH: TSH: 3.084 u[IU]/mL (ref 0.350–4.500)

## 2012-04-24 MED ORDER — PANTOPRAZOLE SODIUM 40 MG PO TBEC: 40.0000 mg | DELAYED_RELEASE_TABLET | Freq: Every day | ORAL | Status: DC

## 2012-04-24 MED ORDER — ALBUTEROL SULFATE HFA 108 (90 BASE) MCG/ACT IN AERS: 2.0000 | INHALATION_SPRAY | Freq: Four times a day (QID) | RESPIRATORY_TRACT | Status: DC | PRN

## 2012-04-24 MED ORDER — PREDNISONE 20 MG PO TABS: 20.0000 mg | ORAL_TABLET | Freq: Two times a day (BID) | ORAL | Status: AC

## 2012-04-24 NOTE — Progress Notes (Signed)
  Subjective:    Patient ID: Alison Richards, female    DOB: [birth year], 61 y.o.   MRN: [id number]  HPI Patient presents to clinic because she has had recent dizziness and shortness of breath for last 2-3 weeks. Dizziness is worse when coughing or leaning over and sometimes when she stands ups. She does have a cough that has worse production in the am but it wax and wanes. She does take occasional Mucinex. Last dose of Mucinex was 3 days ago. She was told she had asthma over 10 years ago but has not had to use inhaler or any meds on a regular basis in many years. She went to pulmonolgist years ago and said she didn't have asthma. She did have proair left over and it does help with SOB, cough. Denies any CP, palpitations, headaches, passing out, fever, chills, swelling. Hx of sleep apnea.     Review of Systems     Objective:   Physical Exam  Constitutional: She is oriented to person, place, and time. She appears well-developed and well-nourished.  HENT:  Head: Normocephalic and atraumatic.  Right Ear: External ear normal.  Left Ear: External ear normal.  Mouth/Throat: Oropharynx is clear and moist. No oropharyngeal exudate.       TM"s normal bilaterally.  Eyes: Conjunctivae are normal.  Neck: Normal range of motion. Neck supple. No thyromegaly present.  Cardiovascular: Normal rate, regular rhythm, normal heart sounds and intact distal pulses.   Pulmonary/Chest: Effort normal.       Some expiratory wheezing mostly on right but at the base of both lungs.   Lymphadenopathy:    She has no cervical adenopathy.  Neurological: She is alert and oriented to person, place, and time.  Skin: Skin is dry.  Psychiatric: She has a normal mood and affect. Her behavior is normal.          Assessment & Plan:  SOB/Cough/Dizziness- Gave prednisone to take twice a day for 5 days. Refilled proair to use as needed for wheezing and SOB. Will call with lab results. Will call today with chest x-ray.  If not improving give [place] a call. Stay hydrated. Consider spironmetry to reevaluate lung function.  Gave Zostavax.

## 2012-04-24 NOTE — Patient Instructions (Addendum)
Gave prednisone to take twice a day for 5 days. Refilled proair to use as needed for wheezing and SOB. Will call with lab results. Will call today with chest x-ray. If not improving give [place] a call. Stay hydrated.   Gave Zostavax.

## 2012-04-25 LAB — VITAMIN D 25 HYDROXY (VIT D DEFICIENCY, FRACTURES): Vit D, 25-Hydroxy: 19 ng/mL — ABNORMAL LOW (ref 30–89)

## 2012-05-14 ENCOUNTER — Ambulatory Visit (INDEPENDENT_AMBULATORY_CARE_PROVIDER_SITE_OTHER): Payer: BC Managed Care – PPO | Admitting: Family Medicine

## 2012-05-14 ENCOUNTER — Encounter: Payer: Self-pay | Admitting: Family Medicine

## 2012-05-14 VITALS — BP 123/81 | HR 99 | Ht 64.0 in | Wt 290.0 lb

## 2012-05-14 DIAGNOSIS — K2990 Gastroduodenitis, unspecified, without bleeding: Secondary | ICD-10-CM

## 2012-05-14 DIAGNOSIS — J45901 Unspecified asthma with (acute) exacerbation: Secondary | ICD-10-CM

## 2012-05-14 DIAGNOSIS — K219 Gastro-esophageal reflux disease without esophagitis: Secondary | ICD-10-CM

## 2012-05-14 DIAGNOSIS — K2970 Gastritis, unspecified, without bleeding: Secondary | ICD-10-CM

## 2012-05-14 MED ORDER — ALBUTEROL SULFATE (5 MG/ML) 0.5% IN NEBU
5.0000 mg | INHALATION_SOLUTION | Freq: Once | RESPIRATORY_TRACT | Status: AC
  Administered 2012-05-14: 5 mg via RESPIRATORY_TRACT

## 2012-05-14 MED ORDER — BECLOMETHASONE DIPROPIONATE 80 MCG/ACT IN AERS: 1.0000 | INHALATION_SPRAY | RESPIRATORY_TRACT | Status: DC | PRN

## 2012-05-14 NOTE — Progress Notes (Signed)
Subjective:    Patient ID: Alison Richards, female    DOB: Mar 21, 1951, 61 y.o.   MRN: [id number]  HPI 2 weeks ago started with cough, wheezing.  Saw Dr. Lesly Rubenstein.  Using her albuterol 1-2 a day.  It gives her HA.  No fever. No URI or allergy sxs.  Dry cough.  No longer an ACEi.  She did feel little but better on her prednisone. She had a normal chest x-ray performed.SOb is aggrevated by being out in the heat.   Says had H pylori last fall. Recently noticed nawing epigastric pain when her stomach is empty. She says Lesly Rubenstein called in protonix that she had used before and she took it for a few days but made her very bloated so discontinued it. Bought some OTC zantac but only using it once a day.    Review of Systems  BP 123/81  Pulse 99  Ht 5\' 4"  (1.626 m)  Wt 290 lb (131.543 kg)  BMI 49.78 kg/m2  SpO2 96%  PF 250 L/min    Allergies  Allergen Reactions  . Dilaudid (Hydromorphone Hcl) Swelling    Throat closing up  . Hydromorphone Shortness Of Breath    Pt had a tightening in her throat  . Acetaminophen     REACTION: hallucinations, H/A AND SEVERE SWELLING  . Erythromycin Ethylsuccinate     REACTION: gi symptoms  . Lisinopril     ASTHMA LIKE SYMPTOMS  . Losartan Potassium-Hctz     REACTION: JOINTS aching, hurting all over  . Morphine Sulfate     REACTION: projectile vomiting  . Sulfonamide Derivatives     REACTION: Rash, swelling  . Valsartan     REACTION: Hurting all over  . Latex Rash    WHEN PT WORE LATEX GLOVES-SHE HAD RASH.  SENSITIVE TO SOME ADHESIVES.    Past Medical History  Diagnosis Date  . Palpitations   . Orthostatic lightheadedness   . Pain in joint   . Hypertension   . Major depression   . Asthma     PAST HX OF ASTHMA WHILE TAKING LISINOPRIL--NO PROBLEMS SINCE DISCONTINUING LISINOPRIL  . Shortness of breath     WITH EXERTION-PT RELATES TO HER WEIGHT  . Obstructive sleep apnea     CPAP EVERY NIGHT-SETTING IS 8  . Blood transfusion     AGE 40  -HEMORRHAGED AFTER TONSILLECTOMY  . Protein in urine   . GERD (gastroesophageal reflux disease)     PAST HX  H-PYLORI   . H/O hiatal hernia   . Arthritis     PAIN AND OA RIGHT KNEE  . PONV (postoperative nausea and vomiting)   . Obesity     Past Surgical History  Procedure Date  . Bladder surgery   . Tubal ligation 1979  . Cesarean section 1979  . Abdominal hysterectomy 1999  . Neck sx for ruptured disc c4-5 2001  . Tonsillectomy age 33  . Cholecystectomy 3-08    Dr Kendrick Ranch  . Left knee replacement 2008  . Total knee arthroplasty 11/14/2011    Procedure: TOTAL KNEE ARTHROPLASTY;  Surgeon: [name], MD;  Location: [facility];  Service: Orthopedics;  Laterality: Right;  combined with general block  . Bilateral salpingoopherectomy     History   Social History  . Marital Status: Married    Spouse Name: Molly Maduro    Number of Children: N/A  . Years of Education: N/A   Occupational History  . LAB [employer]    Social  History Main Topics  . Smoking status: Never Smoker   . Smokeless tobacco: Never Used  . Alcohol Use: No  . Drug Use: No  . Sexually Active: Yes -- Female partner(s)   Other Topics Concern  . Not on file   Social History Narrative  . No narrative on file    Family History  Problem Relation Age of Onset  . Alzheimer's disease Mother   . Hypertension Mother   . Hyperlipidemia Mother   . Alcohol abuse Father   . Cancer Father     skin, prostate, lung  . COPD Father     Outpatient Encounter Prescriptions as of 05/14/2012  Medication Sig Dispense Refill  . albuterol (PROVENTIL HFA;VENTOLIN HFA) 108 (90 BASE) MCG/ACT inhaler Inhale 2 puffs into the lungs every 6 (six) hours as needed for wheezing.  1 Inhaler  3  . Aliskiren-Hydrochlorothiazide (TEKTURNA HCT) 300-12.5 MG TABS Take 1 tablet by mouth daily before breakfast.       . aspirin EC 325 MG tablet       . beclomethasone (QVAR) 80 MCG/ACT inhaler Inhale 1 puff into the lungs as needed.  1 Inhaler  1  .  pantoprazole (PROTONIX) 40 MG tablet Take 1 tablet (40 mg total) by mouth daily.  30 tablet  6  . polyethylene glycol powder (GLYCOLAX/MIRALAX) powder        Facility-Administered Encounter Medications as of 05/14/2012  Medication Dose Route Frequency Provider Last Rate Last Dose  . albuterol (PROVENTIL) (5 MG/ML) 0.5% nebulizer solution 5 mg  5 mg Nebulization Once Agapito Games, MD   5 mg at 05/14/12 1608          Objective:   Physical Exam  Constitutional: She is oriented to person, place, and time. She appears well-developed and well-nourished.  HENT:  Head: Normocephalic and atraumatic.  Right Ear: External ear normal.  Left Ear: External ear normal.  Nose: Nose normal.  Mouth/Throat: Oropharynx is clear and moist.       TMs and canals are clear.   Eyes: Conjunctivae and EOM are normal. Pupils are equal, round, and reactive to light.  Neck: Neck supple. No thyromegaly present.  Cardiovascular: Normal rate, regular rhythm and normal heart sounds.   Pulmonary/Chest: Effort normal and breath sounds normal. She has no wheezes.  Lymphadenopathy:    She has no cervical adenopathy.  Neurological: She is alert and oriented to person, place, and time.  Skin: Skin is warm and dry.  Psychiatric: She has a normal mood and affect.          Assessment & Plan:  Asthma exacerbation - Peak flow in the yellow zone. Given neb treatment. Post peak flows barely improved.  Discussed beefing up her asthma regimen.  Start Qvar 80. 2 puffs twice a day. Call if suddenly getting worse.Will hold off on steroids for now.   GERD/Gastritis - most likely exacerbated by her recent course of prednisone. I. think it's fine to take zantac, but I did ask her to incraase it to twice a day. Cn even inc to 150mg  bid if needed.   If her symptoms are not improving over the next week and we can try a different person pump inhibitor. Continue make sure avoiding caffeine, carbonated beverages and other acidic  irritants to the stomach.

## 2012-05-14 NOTE — Patient Instructions (Signed)
Start QVAR 2 puffs twice a day.  The prescription was sent to the pharmacy. Call me for not feeling much better in one week.

## 2012-05-26 ENCOUNTER — Emergency Department (HOSPITAL_BASED_OUTPATIENT_CLINIC_OR_DEPARTMENT_OTHER): Payer: BC Managed Care – PPO

## 2012-05-26 ENCOUNTER — Encounter (HOSPITAL_BASED_OUTPATIENT_CLINIC_OR_DEPARTMENT_OTHER): Payer: Self-pay | Admitting: Emergency Medicine

## 2012-05-26 ENCOUNTER — Emergency Department (HOSPITAL_BASED_OUTPATIENT_CLINIC_OR_DEPARTMENT_OTHER)
Admission: EM | Admit: 2012-05-26 | Discharge: 2012-05-26 | Disposition: A | Discharge status: Home / Self Care | Payer: BC Managed Care – PPO | Attending: Emergency Medicine | Admitting: Emergency Medicine

## 2012-05-26 DIAGNOSIS — K219 Gastro-esophageal reflux disease without esophagitis: Secondary | ICD-10-CM | POA: Insufficient documentation

## 2012-05-26 DIAGNOSIS — G4733 Obstructive sleep apnea (adult) (pediatric): Secondary | ICD-10-CM | POA: Insufficient documentation

## 2012-05-26 DIAGNOSIS — I10 Essential (primary) hypertension: Secondary | ICD-10-CM | POA: Insufficient documentation

## 2012-05-26 DIAGNOSIS — J45901 Unspecified asthma with (acute) exacerbation: Secondary | ICD-10-CM | POA: Insufficient documentation

## 2012-05-26 DIAGNOSIS — E669 Obesity, unspecified: Secondary | ICD-10-CM | POA: Insufficient documentation

## 2012-05-26 DIAGNOSIS — M129 Arthropathy, unspecified: Secondary | ICD-10-CM | POA: Insufficient documentation

## 2012-05-26 LAB — COMPREHENSIVE METABOLIC PANEL
Albumin: 4.1 g/dL (ref 3.5–5.2)
Alkaline Phosphatase: 79 U/L (ref 39–117)
BUN: 16 mg/dL (ref 6–23)
Creatinine, Ser: 0.8 mg/dL (ref 0.50–1.10)
Potassium: 3.3 mEq/L — ABNORMAL LOW (ref 3.5–5.1)
Total Protein: 7.8 g/dL (ref 6.0–8.3)

## 2012-05-26 LAB — CBC WITH DIFFERENTIAL/PLATELET
Basophils Absolute: 0 10*3/uL (ref 0.0–0.1)
Basophils Relative: 0 % (ref 0–1)
Eosinophils Absolute: 0.2 10*3/uL (ref 0.0–0.7)
Hemoglobin: 13.4 g/dL (ref 12.0–15.0)
MCH: 29.8 pg (ref 26.0–34.0)
MCHC: 33.8 g/dL (ref 30.0–36.0)
Monocytes Relative: 9 % (ref 3–12)
Neutrophils Relative %: 60 % (ref 43–77)
RDW: 13.7 % (ref 11.5–15.5)

## 2012-05-26 MED ORDER — PREDNISONE 50 MG PO TABS
60.0000 mg | ORAL_TABLET | Freq: Once | ORAL | Status: AC
  Administered 2012-05-26: 60 mg via ORAL
  Filled 2012-05-26: qty 1

## 2012-05-26 MED ORDER — ALBUTEROL SULFATE (5 MG/ML) 0.5% IN NEBU
INHALATION_SOLUTION | RESPIRATORY_TRACT | Status: AC
  Administered 2012-05-26: 5 mg via RESPIRATORY_TRACT
  Filled 2012-05-26: qty 1

## 2012-05-26 MED ORDER — SODIUM CHLORIDE 0.9 % IV BOLUS (SEPSIS)
1000.0000 mL | Freq: Once | INTRAVENOUS | Status: AC
  Administered 2012-05-26: 1000 mL via INTRAVENOUS

## 2012-05-26 MED ORDER — IPRATROPIUM BROMIDE 0.02 % IN SOLN
0.5000 mg | Freq: Once | RESPIRATORY_TRACT | Status: AC
  Administered 2012-05-26: 0.5 mg via RESPIRATORY_TRACT
  Filled 2012-05-26: qty 2.5

## 2012-05-26 MED ORDER — ALBUTEROL SULFATE (5 MG/ML) 0.5% IN NEBU
5.0000 mg | INHALATION_SOLUTION | Freq: Once | RESPIRATORY_TRACT | Status: AC
  Administered 2012-05-26: 5 mg via RESPIRATORY_TRACT
  Filled 2012-05-26: qty 1

## 2012-05-26 MED ORDER — ALBUTEROL SULFATE (5 MG/ML) 0.5% IN NEBU
5.0000 mg | INHALATION_SOLUTION | Freq: Once | RESPIRATORY_TRACT | Status: AC
  Administered 2012-05-26: 5 mg via RESPIRATORY_TRACT

## 2012-05-26 MED ORDER — IPRATROPIUM BROMIDE 0.02 % IN SOLN
0.5000 mg | Freq: Once | RESPIRATORY_TRACT | Status: AC
  Administered 2012-05-26: 0.5 mg via RESPIRATORY_TRACT

## 2012-05-26 MED ORDER — PREDNISONE 20 MG PO TABS: 60.0000 mg | ORAL_TABLET | Freq: Every day | ORAL | Status: AC

## 2012-05-26 MED ORDER — IPRATROPIUM BROMIDE 0.02 % IN SOLN
RESPIRATORY_TRACT | Status: AC
  Administered 2012-05-26: 0.5 mg via RESPIRATORY_TRACT
  Filled 2012-05-26: qty 2.5

## 2012-05-26 NOTE — ED Notes (Signed)
Pt c/o sob for a few days.  Pt to follow up with her MD on Tuesday but is getting worse.  Pt states her peak flow has been less than 200 today.  Pt having some difficulty breathing with occasional cough.

## 2012-05-27 NOTE — ED Provider Notes (Signed)
History     CSN: [id number]  Arrival date & time 05/26/12  0911   First MD Initiated Contact with Patient 05/26/12 [phone]      Chief Complaint  Patient presents with  . Shortness of Breath    (Consider location/radiation/quality/duration/timing/severity/associated sxs/prior treatment) HPI Patient is a 61 yo female complaining of gradual increasing chest tightness and cough.  Patient denied chest pain but did endorse chest tightness consistent with consistent with prior asthma exacerbations.  She has not been on prednisone for some time.  She denies fevers but endorses some myalgias.  She denies sick contacts.  There are no other associated or modifying factors.  Past Medical History  Diagnosis Date  . Palpitations   . Orthostatic lightheadedness   . Pain in joint   . Hypertension   . Major depression   . Asthma     PAST HX OF ASTHMA WHILE TAKING LISINOPRIL--NO PROBLEMS SINCE DISCONTINUING LISINOPRIL  . Shortness of breath     WITH EXERTION-PT RELATES TO HER WEIGHT  . Obstructive sleep apnea     CPAP EVERY NIGHT-SETTING IS 8  . Blood transfusion     AGE 40 -HEMORRHAGED AFTER TONSILLECTOMY  . Protein in urine   . GERD (gastroesophageal reflux disease)     PAST HX  H-PYLORI   . H/O hiatal hernia   . Arthritis     PAIN AND OA RIGHT KNEE  . PONV (postoperative nausea and vomiting)   . Obesity     Past Surgical History  Procedure Date  . Bladder surgery   . Tubal ligation 1979  . Cesarean section 1979  . Abdominal hysterectomy 1999  . Neck sx for ruptured disc c4-5 2001  . Tonsillectomy age 16  . Cholecystectomy 3-08    Dr Kendrick Ranch  . Left knee replacement 2008  . Total knee arthroplasty 11/14/2011    Procedure: TOTAL KNEE ARTHROPLASTY;  Surgeon: [name], MD;  Location: [facility];  Service: Orthopedics;  Laterality: Right;  combined with general block  . Bilateral salpingoopherectomy     Family History  Problem Relation Age of Onset  . Alzheimer's disease Mother    . Hypertension Mother   . Hyperlipidemia Mother   . Alcohol abuse Father   . Cancer Father     skin, prostate, lung  . COPD Father     History  Substance Use Topics  . Smoking status: Never Smoker   . Smokeless tobacco: Never Used  . Alcohol Use: No    OB History    Grav Para Term Preterm Abortions TAB SAB Ect Mult Living                  Review of Systems  Constitutional: Negative.   HENT: Negative.   Eyes: Negative.   Respiratory: Positive for cough, chest tightness and shortness of breath.   Cardiovascular: Negative.   Gastrointestinal: Negative.   Genitourinary: Negative.   Musculoskeletal: Positive for myalgias.  Skin: Negative.   Neurological: Negative.   Hematological: Negative.   Psychiatric/Behavioral: Negative.   All other systems reviewed and are negative.    Allergies  Dilaudid; Hydromorphone; Acetaminophen; Erythromycin ethylsuccinate; Lisinopril; Losartan potassium-hctz; Morphine sulfate; Sulfonamide derivatives; Valsartan; and Latex  Home Medications   Current Outpatient Rx  Name Route Sig Dispense Refill  . HYDROCHLOROTHIAZIDE 25 MG PO TABS Oral Take 12.5 mg by mouth daily.    Marland Kitchen RANITIDINE HCL 150 MG PO CAPS Oral Take 150 mg by mouth 2 (two) times daily.    Marland Kitchen  ALBUTEROL SULFATE HFA 108 (90 BASE) MCG/ACT IN AERS Inhalation Inhale 2 puffs into the lungs every 6 (six) hours as needed for wheezing. 1 Inhaler 3  . ALISKIREN-HYDROCHLOROTHIAZIDE 300-12.5 MG PO TABS Oral Take 1 tablet by mouth daily before breakfast.     . BECLOMETHASONE DIPROPIONATE 80 MCG/ACT IN AERS Inhalation Inhale 1 puff into the lungs as needed. 1 Inhaler 1  . PANTOPRAZOLE SODIUM 40 MG PO TBEC Oral Take 1 tablet (40 mg total) by mouth daily. 30 tablet 6  . POLYETHYLENE GLYCOL 3350 PO POWD      . PREDNISONE 20 MG PO TABS Oral Take 3 tablets (60 mg total) by mouth daily. 15 tablet 0    BP 155/81  Pulse 97  Temp 98.3 F (36.8 C) (Oral)  Resp 20  SpO2 97%  Physical Exam    Nursing note and vitals reviewed. GEN: Well-developed, well-nourished female in no distress HEENT: Atraumatic, normocephalic. Oropharynx clear without erythema EYES: PERRLA BL, no scleral icterus. NECK: Trachea midline, no meningismus CV: regular rate and rhythm. No murmurs, rubs, or gallops PULM: Diminished throughout GI: soft, non-tender. No guarding, rebound, or tenderness. + bowel sounds  GU: deferred Neuro: cranial nerves grossly 2-12 intact, no abnormalities of strength or sensation, A and O x 3 MSK: Patient moves all 4 extremities symmetrically, no deformity, edema, or injury noted Skin: No rashes petechiae, purpura, or jaundice Psych: no abnormality of mood   ED Course  Procedures (including critical care time)  Labs Reviewed  COMPREHENSIVE METABOLIC PANEL - Abnormal; Notable for the following:    Potassium 3.3 (*)     Glucose, Bld 105 (*)     GFR calc non Af Amer 79 (*)     All other components within normal limits  CBC WITH DIFFERENTIAL   Dg Chest 2 View  05/26/2012  *RADIOLOGY REPORT*  Clinical Data: Shortness of breath  CHEST - 2 VIEW  Comparison: 04/24/2012  Findings: Cardiomediastinal silhouette is stable.  No acute infiltrate or pulmonary edema.  Metallic fixation plate noted cervical spine.  Minimal perihilar increased bronchial markings without focal consolidation.  Stable mild degenerative changes thoracic spine.  IMPRESSION: No acute infiltrate or pulmonary edema.  Minimal perihilar increased bronchial markings without focal consolidation.  Original Report Authenticated By: Natasha Mead, M.D.     1. Asthma exacerbation       MDM  Patient was evaluated by myself.  She was diminished throughout.  Patient had had nearly a week of symptoms and CXR was performed to evaluate for infiltrate with no findings.  Patient was given prednisone as well as breathing treatments.  Upon 1st reassessment wheezing had developed but this resolved completely by the third treatment.   Labs were unremarkable and patient had CXR with no infiltrate.  Patient was feeling better.  Patient was discharged with prescription for prednisone.  She was advised to follow-up with her PCP.          Cyndra Numbers, MD 05/27/12 [phone]

## 2012-05-28 ENCOUNTER — Encounter: Payer: Self-pay | Admitting: Family Medicine

## 2012-05-28 ENCOUNTER — Ambulatory Visit (INDEPENDENT_AMBULATORY_CARE_PROVIDER_SITE_OTHER): Payer: BC Managed Care – PPO | Admitting: Family Medicine

## 2012-05-28 VITALS — BP 103/54 | HR 85 | Temp 98.7°F | Ht 64.0 in | Wt 288.0 lb

## 2012-05-28 DIAGNOSIS — J45909 Unspecified asthma, uncomplicated: Secondary | ICD-10-CM

## 2012-05-28 DIAGNOSIS — J45901 Unspecified asthma with (acute) exacerbation: Secondary | ICD-10-CM

## 2012-05-28 MED ORDER — ALBUTEROL SULFATE (2.5 MG/3ML) 0.083% IN NEBU
2.5000 mg | INHALATION_SOLUTION | Freq: Once | RESPIRATORY_TRACT | Status: AC
  Administered 2012-05-28: 2.5 mg via RESPIRATORY_TRACT

## 2012-05-28 MED ORDER — MOMETASONE FURO-FORMOTEROL FUM 100-5 MCG/ACT IN AERO: 2.0000 | INHALATION_SPRAY | Freq: Two times a day (BID) | RESPIRATORY_TRACT | Status: DC

## 2012-05-28 MED ORDER — HYDROCORTISONE-ACETIC ACID 1-2 % OT SOLN: 4.0000 [drp] | Freq: Two times a day (BID) | OTIC | Status: DC

## 2012-05-28 NOTE — Patient Instructions (Addendum)
Recommend start claritin daily.   Stop QVAR and start Dulera 2 puffs inhaled twice a day.  Complete course of steroids.  Increase your Proair to 4 puffs every 4 hours as needed.

## 2012-05-28 NOTE — Progress Notes (Signed)
Subjective:    Patient ID: Alison Richards, female    DOB: [birth year], 61 y.o.   MRN: [id number]  HPI Seen in ED on Sunday ( 2 days ago) for asthma exacerbation.  Says had 3 NEB tx, neg CXR and normal bloodwork.  Given 60mg  daily. No known triggers. Eyes were itching last night.  No other allergy sxs.  Her best peak flow was 350 years ago.  Used her proair 5 times yesterday.  Says stil lSOB.  No fever.  Using QVAR BID.    Review of Systems BP 103/54  Pulse 85  Temp 98.7 F (37.1 C) (Oral)  Ht 5\' 4"  (1.626 m)  Wt 288 lb (130.636 kg)  BMI 49.44 kg/m2  SpO2 97%  PF 250 L/min    Allergies  Allergen Reactions  . Dilaudid (Hydromorphone Hcl) Swelling    Throat closing up  . Hydromorphone Shortness Of Breath    Pt had a tightening in her throat  . Acetaminophen     REACTION: hallucinations, H/A AND SEVERE SWELLING  . Erythromycin Ethylsuccinate     REACTION: gi symptoms  . Lisinopril     ASTHMA LIKE SYMPTOMS  . Losartan Potassium-Hctz     REACTION: JOINTS aching, hurting all over  . Morphine Sulfate     REACTION: projectile vomiting  . Sulfonamide Derivatives     REACTION: Rash, swelling  . Valsartan     REACTION: Hurting all over  . Latex Rash    WHEN PT WORE LATEX GLOVES-SHE HAD RASH.  SENSITIVE TO SOME ADHESIVES.    Past Medical History  Diagnosis Date  . Palpitations   . Orthostatic lightheadedness   . Pain in joint   . Hypertension   . Major depression   . Asthma     PAST HX OF ASTHMA WHILE TAKING LISINOPRIL--NO PROBLEMS SINCE DISCONTINUING LISINOPRIL  . Shortness of breath     WITH EXERTION-PT RELATES TO HER WEIGHT  . Obstructive sleep apnea     CPAP EVERY NIGHT-SETTING IS 8  . Blood transfusion     AGE 34 -HEMORRHAGED AFTER TONSILLECTOMY  . Protein in urine   . GERD (gastroesophageal reflux disease)     PAST HX  H-PYLORI   . H/O hiatal hernia   . Arthritis     PAIN AND OA RIGHT KNEE  . PONV (postoperative nausea and vomiting)   . Obesity      Past Surgical History  Procedure Date  . Bladder surgery   . Tubal ligation 1979  . Cesarean section 1979  . Abdominal hysterectomy 1999  . Neck sx for ruptured disc c4-5 2001  . Tonsillectomy age 23  . Cholecystectomy 3-08    Dr Kendrick Ranch  . Left knee replacement 2008  . Total knee arthroplasty 11/14/2011    Procedure: TOTAL KNEE ARTHROPLASTY;  Surgeon: [name], MD;  Location: [facility];  Service: Orthopedics;  Laterality: Right;  combined with general block  . Bilateral salpingoopherectomy     History   Social History  . Marital Status: Married    Spouse Name: Molly Maduro    Number of Children: N/A  . Years of Education: N/A   Occupational History  . LAB [employer]    Social History Main Topics  . Smoking status: Never Smoker   . Smokeless tobacco: Never Used  . Alcohol Use: No  . Drug Use: No  . Sexually Active: Yes -- Female partner(s)   Other Topics Concern  . Not on file   Social  History Narrative  . No narrative on file    Family History  Problem Relation Age of Onset  . Alzheimer's disease Mother   . Hypertension Mother   . Hyperlipidemia Mother   . Alcohol abuse Father   . Cancer Father     skin, prostate, lung  . COPD Father     Outpatient Encounter Prescriptions as of 05/28/2012  Medication Sig Dispense Refill  . albuterol (PROVENTIL HFA;VENTOLIN HFA) 108 (90 BASE) MCG/ACT inhaler Inhale 2 puffs into the lungs every 6 (six) hours as needed for wheezing.  1 Inhaler  3  . Aliskiren-Hydrochlorothiazide (TEKTURNA HCT) 300-12.5 MG TABS Take 1 tablet by mouth daily before breakfast.       . beclomethasone (QVAR) 80 MCG/ACT inhaler Inhale 1 puff into the lungs as needed.  1 Inhaler  1  . hydrochlorothiazide (HYDRODIURIL) 25 MG tablet Take 12.5 mg by mouth daily.      . pantoprazole (PROTONIX) 40 MG tablet Take 1 tablet (40 mg total) by mouth daily.  30 tablet  6  . polyethylene glycol powder (GLYCOLAX/MIRALAX) powder       . predniSONE (DELTASONE) 20 MG  tablet Take 3 tablets (60 mg total) by mouth daily.  15 tablet  0  . ranitidine (ZANTAC) 150 MG capsule Take 150 mg by mouth 2 (two) times daily.      . mometasone-formoterol (DULERA) 100-5 MCG/ACT AERO Inhale 2 puffs into the lungs 2 (two) times daily.  13 g  2  . DISCONTD: acetic acid-hydrocortisone (VOSOL-HC) otic solution Place 4 drops into the left ear 2 (two) times daily. X 7 days  10 mL  0   Facility-Administered Encounter Medications as of 05/28/2012  Medication Dose Route Frequency Provider Last Rate Last Dose  . albuterol (PROVENTIL) (2.5 MG/3ML) 0.083% nebulizer solution 2.5 mg  2.5 mg Nebulization Once Agapito Games, MD   2.5 mg at 05/28/12 0900          Objective:   Physical Exam  Constitutional: She is oriented to person, place, and time. She appears well-developed and well-nourished.  HENT:  Head: Normocephalic and atraumatic.  Right Ear: External ear normal.  Left Ear: External ear normal.  Nose: Nose normal.  Mouth/Throat: Oropharynx is clear and moist.       TMs and canals are clear.   Eyes: Conjunctivae and EOM are normal. Pupils are equal, round, and reactive to light.  Neck: Neck supple. No thyromegaly present.  Cardiovascular: Normal rate, regular rhythm and normal heart sounds.   Pulmonary/Chest: Effort normal. She has no wheezes.       Mild wheeze on the right.  Coarse BS.   Lymphadenopathy:    She has no cervical adenopathy.  Neurological: She is alert and oriented to person, place, and time.  Skin: Skin is warm and dry.  Psychiatric: She has a normal mood and affect.          Assessment & Plan:  Asthma exacerbation.- Given NEB tx here in the office.   Peak flow improved post neb.  Recommend tx for allergies as well with claritin.  Stop QVAR and start Dulera 2puff inhaled twice a day. Given sample and coupon card. Increase proair to 4 puffs every 4 hours.  Complete your steroid course. F/U in 1 months. Call if gets worse, doesn't get better, or  runs a fever.

## 2012-06-19 ENCOUNTER — Ambulatory Visit (INDEPENDENT_AMBULATORY_CARE_PROVIDER_SITE_OTHER): Payer: BC Managed Care – PPO | Admitting: Family Medicine

## 2012-06-19 ENCOUNTER — Encounter: Payer: Self-pay | Admitting: Family Medicine

## 2012-06-19 VITALS — BP 121/72 | HR 92 | Wt 290.0 lb

## 2012-06-19 DIAGNOSIS — R110 Nausea: Secondary | ICD-10-CM

## 2012-06-19 DIAGNOSIS — R1013 Epigastric pain: Secondary | ICD-10-CM

## 2012-06-19 DIAGNOSIS — J45909 Unspecified asthma, uncomplicated: Secondary | ICD-10-CM

## 2012-06-19 NOTE — Progress Notes (Signed)
  Subjective:    Patient ID: Alison Richards, female    DOB: 16-May-1951, 61 y.o.   MRN: [id number]  HPI Asthma - dong well on Dulera.  Says at least 50% better. Peak flow 400 today.  Switched to [employer] last week instead of claritin.  Occ using mucinex.  Completed the sterods.   Abdominal bloating and swelling - Says when first eats will get a nawing/tight  sensation in her epigastrum,  her stomach and then will have BM. Was on prevacid. We had treated her for H pylori recently.  Does get nauseated when it happens. Occ wakes up nauseated. Hx of cholecystectomy. Says feels like a nawing sensations also when she doesn't eat. Then foot acutally makes it feel better.  Review of Systems     Objective:   Physical Exam  Constitutional: She is oriented to person, place, and time. She appears well-developed and well-nourished.  HENT:  Head: Normocephalic and atraumatic.  Cardiovascular: Normal rate, regular rhythm and normal heart sounds.   Pulmonary/Chest: Effort normal and breath sounds normal.  Abdominal: Soft. Bowel sounds are normal. She exhibits no distension and no mass. There is no tenderness. There is no rebound and no guarding.  Neurological: She is alert and oriented to person, place, and time.  Skin: Skin is warm and dry.  Psychiatric: She has a normal mood and affect. Her behavior is normal.          Assessment & Plan:  Asthma - Dong well on dulera. Continue for one more months then can step back down to QVAR if doing well.  Now on Allegra.  Showed her how to use her peak flow meter and set her goals for her for red, yellow, and green.    Epigastric pain - her description of pain sounds like a GI ulcer but she has recently been treated for H Pylori and was on  A PPI, though off PPI now.  Will refer to GI for further evaluation.  Given samples of dexilant for now. I think she needs further w/u. Hx of cholecystectomy.

## 2012-06-21 ENCOUNTER — Encounter: Payer: Self-pay | Admitting: Gastroenterology

## 2012-07-16 ENCOUNTER — Encounter: Payer: Self-pay | Admitting: Gastroenterology

## 2012-07-16 ENCOUNTER — Ambulatory Visit (INDEPENDENT_AMBULATORY_CARE_PROVIDER_SITE_OTHER): Payer: BC Managed Care – PPO | Admitting: Gastroenterology

## 2012-07-16 VITALS — BP 136/78 | HR 92 | Ht 64.0 in | Wt 287.0 lb

## 2012-07-16 DIAGNOSIS — R1013 Epigastric pain: Secondary | ICD-10-CM

## 2012-07-16 DIAGNOSIS — Z9049 Acquired absence of other specified parts of digestive tract: Secondary | ICD-10-CM

## 2012-07-16 DIAGNOSIS — K219 Gastro-esophageal reflux disease without esophagitis: Secondary | ICD-10-CM

## 2012-07-16 DIAGNOSIS — Z9089 Acquired absence of other organs: Secondary | ICD-10-CM

## 2012-07-16 MED ORDER — OMEPRAZOLE 40 MG PO CPDR: 40.0000 mg | DELAYED_RELEASE_CAPSULE | Freq: Every day | ORAL | Status: DC

## 2012-07-16 NOTE — Patient Instructions (Signed)
We have sent the following medications to your pharmacy for you to pick up at your convenience: Prilosec. Take one pill 30 minutes before breakfast every day. Also schedule a follow up visit 6 weeks from now.  Your physician has requested that you go to the basement for the following lab work before leaving today: H. Pylori on stool.    Gastroesophageal Reflux Disease, Adult Gastroesophageal reflux disease (GERD) happens when acid from your stomach flows up into the esophagus. When acid comes in contact with the esophagus, the acid causes soreness (inflammation) in the esophagus. Over time, GERD may create small holes (ulcers) in the lining of the esophagus. CAUSES   Increased body weight. This puts pressure on the stomach, making acid rise from the stomach into the esophagus.   Smoking. This increases acid production in the stomach.   Drinking alcohol. This causes decreased pressure in the lower esophageal sphincter (valve or ring of muscle between the esophagus and stomach), allowing acid from the stomach into the esophagus.   Late evening meals and a full stomach. This increases pressure and acid production in the stomach.   A malformed lower esophageal sphincter.  Sometimes, no cause is found. SYMPTOMS   Burning pain in the lower part of the mid-chest behind the breastbone and in the mid-stomach area. This may occur twice a week or more often.   Trouble swallowing.   Sore throat.   Dry cough.   Asthma-like symptoms including chest tightness, shortness of breath, or wheezing.  DIAGNOSIS  Your caregiver may be able to diagnose GERD based on your symptoms. In some cases, X-rays and other tests may be done to check for complications or to check the condition of your stomach and esophagus. TREATMENT  Your caregiver may recommend over-the-counter or prescription medicines to help decrease acid production. Ask your caregiver before starting or adding any new medicines.  HOME CARE  INSTRUCTIONS   Change the factors that you can control. Ask your caregiver for guidance concerning weight loss, quitting smoking, and alcohol consumption.   Avoid foods and drinks that make your symptoms worse, such as:   Caffeine or alcoholic drinks.   Chocolate.   Peppermint or mint flavorings.   Garlic and onions.   Spicy foods.   Citrus fruits, such as oranges, lemons, or limes.   Tomato-based foods such as sauce, chili, salsa, and pizza.   Fried and fatty foods.   Avoid lying down for the 3 hours prior to your bedtime or prior to taking a nap.   Eat small, frequent meals instead of large meals.   Wear loose-fitting clothing. Do not wear anything tight around your waist that causes pressure on your stomach.   Raise the head of your bed 6 to 8 inches with wood blocks to help you sleep. Extra pillows will not help.   Only take over-the-counter or prescription medicines for pain, discomfort, or fever as directed by your caregiver.   Do not take aspirin, ibuprofen, or other nonsteroidal anti-inflammatory drugs (NSAIDs).  SEEK IMMEDIATE MEDICAL CARE IF:   You have pain in your arms, neck, jaw, teeth, or back.   Your pain increases or changes in intensity or duration.   You develop nausea, vomiting, or sweating (diaphoresis).   You develop shortness of breath, or you faint.   Your vomit is green, yellow, black, or looks like coffee grounds or blood.   Your stool is red, bloody, or black.  These symptoms could be signs of other problems, such as  heart disease, gastric bleeding, or esophageal bleeding. MAKE SURE YOU:   Understand these instructions.   Will watch your condition.   Will get help right away if you are not doing well or get worse.  Document Released: 07/26/2005 Document Revised: 10/05/2011 Document Reviewed: 05/05/2011 [facility] Patient Information 2012 [place], [place]  CC: Nani Gasser, [place]. D.

## 2012-07-16 NOTE — Progress Notes (Signed)
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 She apparently was treated for H. pylori infection one year ago , and has had previous cholecystectomy. She describes atypicals subxiphoid burning sometimes with food, sometimes on an empty stomach, but no nocturnal awakening or dysphagia or history of hepatitis or pancreatitis. She has regular bowel movements without melena or hematochezia. She had apparently an endoscopy and colonoscopy with Dr. Randa Evens 5 years ago. I do not have these reports for review. Currently she is been bothered with severe asthmatic bronchitis requiring prednisone therapy and multiple inhalers. She has chronic obesity with a BMI of 49.26. Review of her labs shows normal CBC, liver profile, and metabolic profile. To me, she denies dysphagia, or any hepatobiliary or lower gastrointestinal problems. She has a history of multiple drug allergies including Tylenol, and does use occasional NSAIDs.  I have reviewed this patient's present history, medical and surgical past history, allergies and medications.     ROS: The remainder of the 10 point ROS is negative.... currently is doing fairly well with her asthma but does have shortness of breath with exertion. She has had bilateral knee replacements, has a history of sleep apnea, and recurrent urinary tract infections.     Physical Exam: Elderly obese patient in no acute distress. Blood pressure 136/78, pulse 92 and regular, and weight 287 pounds the BMI of 49.26. General well developed well nourished patient in no acute distress, appearing their stated age Eyes PERRLA, no icterus, fundoscopic exam per opthamologist Skin no lesions noted Neck supple, no adenopathy, no thyroid enlargement, no tenderness Chest clear to percussion and auscultation Heart no significant murmurs, gallops or rubs  noted Abdomen no hepatosplenomegaly masses or tenderness, BS normal.  Extremities no acute joint lesions, edema, phlebitis or evidence of cellulitis. Neurologic patient oriented x 3, cranial nerves intact, no focal neurologic deficits noted. Psychological mental status normal and normal affect.  Assessment and plan: Probable hyperacidity with acid reflux and mild esophagitis. She is status post cholecystectomy, ulcers had colonoscopy and endoscopy within the last 5 years by Dr. Randa Evens. She is not a candidate for endoscopy with a BMI of 50, and also would not do endoscopy because of her history recently of relapse of her severe asthma. It is certainly possible that some of her acid reflux may contribute to her asthma difficulties. I placed her on a PPI covered by her healthcare plan every morning 30 minutes before breakfast with standard and diarrhea flux regime, and we'll see her back in 6 weeks' time for followup. Stool H. pylori antigen ordered for followup of her previous H. pylori treatment. She been urged to continue her pulmonary medications as per primary care and her pulmonary physicians. If her symptoms worsen, we will check upper GI series.  No diagnosis found.

## 2012-07-17 ENCOUNTER — Other Ambulatory Visit: Payer: BC Managed Care – PPO

## 2012-07-17 ENCOUNTER — Other Ambulatory Visit: Payer: Self-pay | Admitting: Gastroenterology

## 2012-07-17 DIAGNOSIS — R1013 Epigastric pain: Secondary | ICD-10-CM

## 2012-07-18 LAB — HELICOBACTER PYLORI  SPECIAL ANTIGEN: H. PYLORI Antigen: NEGATIVE

## 2012-08-27 ENCOUNTER — Ambulatory Visit (INDEPENDENT_AMBULATORY_CARE_PROVIDER_SITE_OTHER): Payer: BC Managed Care – PPO | Admitting: Gastroenterology

## 2012-08-27 ENCOUNTER — Encounter: Payer: Self-pay | Admitting: Gastroenterology

## 2012-08-27 VITALS — BP 120/68 | HR 80 | Ht 65.0 in | Wt 288.4 lb

## 2012-08-27 DIAGNOSIS — K219 Gastro-esophageal reflux disease without esophagitis: Secondary | ICD-10-CM

## 2012-08-27 DIAGNOSIS — R079 Chest pain, unspecified: Secondary | ICD-10-CM

## 2012-08-27 NOTE — Progress Notes (Signed)
This is a morbidly obese 61 year old Caucasian female who has recurrent epigastric-subxiphoid spasms with some intermittent dysphagia. Attempts to treat her with PPI when successful because she relates this medication caused surgery be bloated. She continues to complain of a foul body odor without passage of excessive flatus, belching, burping, with standard reflux therapy. She does use when necessary MiraLax for constipation. The patient relates a previous trials of H2 blockers also did not alleviate her symptomatology.  Current Medications, Allergies, Past Medical History, Past Surgical History, Family History and Social History were reviewed in [employer] record.  Pertinent Review of Systems Negative   Physical Exam: Healthy but obese patient in no distress. Blood pressure 120/68, pulse 80 and regular, and BMI of 47.99 with weight 288 pounds. I cannot appreciate stigmata of chronic liver disease. I cannot appreciate wheezes, rhonchi, she appears to be irregular rhythm without murmurs gallops or rubs. There is no definite organomegaly, abdominal masses or tenderness. Bowel sounds are normal. Mental status is normal.    Assessment and Plan: Rule out esophageal spasm, GERD, versus functional problems. I have scheduled her for esophageal manometry with impedance studies. Further treatment appropriate for this examination results. Encounter Diagnoses  Name Primary?  . Esophageal reflux Yes  . Chest pain

## 2012-08-27 NOTE — Patient Instructions (Addendum)
You have been scheduled for an esophageal manometry at [facility] on 09/02/12 at 9:00am. Please arrive 30 minutes prior to your procedure for registration. You will need to go to outpatient registration (1st floor of the hospital) first. Make certain to bring your insurance cards as well as a complete list of medications.  Please remember the following:  1) Nothing to eat or drink after 12:00 midnight on the night before your test.  2) Hold all diabetic medications/insulin the morning of the test. You may eat and take             your medications after the test.  3) For 3 days prior to your test do not take: Dexilant, Prevacid, Nexium, Protonix,         Aciphex, Zegerid, Pantoprazole, Prilosec or omeprazole.  4) For 2 days prior to your test, do not take: Reglan, Tagamet, Zantac, Axid or Pepcid.  5) You MAY use an antacid such as Rolaids or Tums up to 12 hours prior to your test.  It will take at least 2 weeks to receive the results of this test from your physician. ------------------------------------------ ABOUT ESOPHAGEAL MANOMETRY Esophageal manometry (muh-NOM-uh-tree) is a test that gauges how well your esophagus works. Your esophagus is the long, muscular tube that connects your throat to your stomach. Esophageal manometry measures the rhythmic muscle contractions (peristalsis) that occur in your esophagus when you swallow. Esophageal manometry also measures the coordination and force exerted by the muscles of your esophagus.  During esophageal manometry, a thin, flexible tube (catheter) that contains sensors is passed through your nose, down your esophagus and into your stomach. Esophageal manometry can be helpful in diagnosing some mostly uncommon disorders that affect your esophagus.  Why it's done Esophageal manometry is used to evaluate the movement (motility) of food through the esophagus and into the stomach. The test measures how well the circular bands of muscle  (sphincters) at the top and bottom of your esophagus open and close, as well as the pressure, strength and pattern of the wave of esophageal muscle contractions that moves food along.  What you can expect Esophageal manometry is an outpatient procedure done without sedation. Most people tolerate it well. You may be asked to change into a hospital gown before the test starts.  During esophageal manometry  While you are sitting up, a member of your health care team sprays your throat with a numbing medication or puts numbing gel in your nose or both.  A catheter is guided through your nose into your esophagus. The catheter may be sheathed in a water-filled sleeve. It doesn't interfere with your breathing. However, your eyes may water, and you may gag. You may have a slight nosebleed from irritation.  After the catheter is in place, you may be asked to lie on your back on an exam table, or you may be asked to remain seated.  You then swallow small sips of water. As you do, a computer connected to the catheter records the pressure, strength and pattern of your esophageal muscle contractions.  During the test, you'll be asked to breathe slowly and smoothly, remain as still as possible, and swallow only when you're asked to do so.  A member of your health care team may move the catheter down into your stomach while the catheter continues its measurements.  The catheter then is slowly withdrawn. The test usually lasts 20 to 30 minutes.  After esophageal manometry  When your esophageal manometry is complete, you  may return to your normal activities  This test typically takes 30-45 minutes to complete. ________________________________________________________________________________

## 2012-08-28 ENCOUNTER — Encounter: Payer: Self-pay | Admitting: Gastroenterology

## 2012-09-02 ENCOUNTER — Encounter (HOSPITAL_COMMUNITY)
Admission: RE | Disposition: A | Discharge status: Home / Self Care | Payer: Self-pay | Source: Ambulatory Visit | Attending: Gastroenterology

## 2012-09-02 ENCOUNTER — Ambulatory Visit (HOSPITAL_COMMUNITY)
Admission: RE | Admit: 2012-09-02 | Discharge: 2012-09-02 | Disposition: A | Discharge status: Home / Self Care | Payer: BC Managed Care – PPO | Source: Ambulatory Visit | Attending: Gastroenterology | Admitting: Gastroenterology

## 2012-09-02 ENCOUNTER — Encounter (HOSPITAL_COMMUNITY): Payer: Self-pay | Admitting: *Deleted

## 2012-09-02 DIAGNOSIS — R079 Chest pain, unspecified: Secondary | ICD-10-CM | POA: Insufficient documentation

## 2012-09-02 DIAGNOSIS — K219 Gastro-esophageal reflux disease without esophagitis: Secondary | ICD-10-CM | POA: Insufficient documentation

## 2012-09-02 HISTORY — PX: ESOPHAGEAL MANOMETRY: SHX5429

## 2012-09-02 SURGERY — MANOMETRY, ESOPHAGUS

## 2012-09-02 MED ORDER — LIDOCAINE VISCOUS 2 % MT SOLN
OROMUCOSAL | Status: AC
  Filled 2012-09-02: qty 15

## 2012-09-03 ENCOUNTER — Encounter (HOSPITAL_COMMUNITY): Payer: Self-pay

## 2012-09-03 ENCOUNTER — Encounter (HOSPITAL_COMMUNITY): Payer: Self-pay | Admitting: Gastroenterology

## 2012-09-09 ENCOUNTER — Encounter (HOSPITAL_BASED_OUTPATIENT_CLINIC_OR_DEPARTMENT_OTHER): Payer: BC Managed Care – PPO | Admitting: Gastroenterology

## 2012-09-09 ENCOUNTER — Encounter: Payer: Self-pay | Admitting: Gastroenterology

## 2012-09-09 DIAGNOSIS — R079 Chest pain, unspecified: Secondary | ICD-10-CM

## 2012-09-09 DIAGNOSIS — K219 Gastro-esophageal reflux disease without esophagitis: Secondary | ICD-10-CM

## 2012-09-09 NOTE — Progress Notes (Unsigned)
Patient ID: Alison Richards, female   DOB: [birth year], 61 y.o.   MRN: [id number]

## 2012-09-09 NOTE — Progress Notes (Signed)
Patient ID: Alison Richards, female   DOB: Jan 02, 1951, 61 y.o.   MRN: [id number]    ESOPHAGEAL MANOMETRY NORMAL ON 09/01/12.NO ESOPHAGEAL MOTILITY DISORDER NOTED WITH NORMAL PERISTALSIS.LES PRESSURE 35 MM HG AND NORMAL RELAXATION.Marland Kitchen

## 2012-09-16 ENCOUNTER — Encounter: Payer: Self-pay | Admitting: Gastroenterology

## 2012-09-19 ENCOUNTER — Telehealth: Payer: Self-pay | Admitting: *Deleted

## 2012-09-19 ENCOUNTER — Encounter: Payer: Self-pay | Admitting: Gastroenterology

## 2012-09-19 NOTE — Telephone Encounter (Signed)
Sheryn Bison, MD 09/09/2012 4:26 PM Signed  Patient ID: Alison Richards, female DOB: 28-Jun-1951, 61 y.o. MRN: [id number]  ESOPHAGEAL MANOMETRY NORMAL ON 09/01/12.NO ESOPHAGEAL MOTILITY DISORDER NOTED WITH NORMAL PERISTALSIS.LES PRESSURE 35 MM HG AND NORMAL RELAXATION.Marland Kitchen  Patient sent message thru my chart inquiring about her test results. Message listed below. Mychart sent me a message I have results. However there are not results listed.   Just want to know how my test turned out.           Called and left patient a message to call me back.

## 2012-10-05 ENCOUNTER — Emergency Department (HOSPITAL_BASED_OUTPATIENT_CLINIC_OR_DEPARTMENT_OTHER): Payer: BC Managed Care – PPO

## 2012-10-05 ENCOUNTER — Encounter (HOSPITAL_BASED_OUTPATIENT_CLINIC_OR_DEPARTMENT_OTHER): Payer: Self-pay | Admitting: *Deleted

## 2012-10-05 ENCOUNTER — Emergency Department (HOSPITAL_BASED_OUTPATIENT_CLINIC_OR_DEPARTMENT_OTHER)
Admission: EM | Admit: 2012-10-05 | Discharge: 2012-10-05 | Disposition: A | Discharge status: Home / Self Care | Payer: BC Managed Care – PPO | Attending: Emergency Medicine | Admitting: Emergency Medicine

## 2012-10-05 DIAGNOSIS — Z8719 Personal history of other diseases of the digestive system: Secondary | ICD-10-CM | POA: Insufficient documentation

## 2012-10-05 DIAGNOSIS — E783 Hyperchylomicronemia: Secondary | ICD-10-CM | POA: Insufficient documentation

## 2012-10-05 DIAGNOSIS — Z87448 Personal history of other diseases of urinary system: Secondary | ICD-10-CM | POA: Insufficient documentation

## 2012-10-05 DIAGNOSIS — Z87442 Personal history of urinary calculi: Secondary | ICD-10-CM | POA: Insufficient documentation

## 2012-10-05 DIAGNOSIS — G4733 Obstructive sleep apnea (adult) (pediatric): Secondary | ICD-10-CM | POA: Insufficient documentation

## 2012-10-05 DIAGNOSIS — Z8679 Personal history of other diseases of the circulatory system: Secondary | ICD-10-CM | POA: Insufficient documentation

## 2012-10-05 DIAGNOSIS — I10 Essential (primary) hypertension: Secondary | ICD-10-CM | POA: Insufficient documentation

## 2012-10-05 DIAGNOSIS — F329 Major depressive disorder, single episode, unspecified: Secondary | ICD-10-CM | POA: Insufficient documentation

## 2012-10-05 DIAGNOSIS — Z8619 Personal history of other infectious and parasitic diseases: Secondary | ICD-10-CM | POA: Insufficient documentation

## 2012-10-05 DIAGNOSIS — E669 Obesity, unspecified: Secondary | ICD-10-CM | POA: Insufficient documentation

## 2012-10-05 DIAGNOSIS — M549 Dorsalgia, unspecified: Secondary | ICD-10-CM

## 2012-10-05 DIAGNOSIS — Z79899 Other long term (current) drug therapy: Secondary | ICD-10-CM | POA: Insufficient documentation

## 2012-10-05 DIAGNOSIS — F3289 Other specified depressive episodes: Secondary | ICD-10-CM | POA: Insufficient documentation

## 2012-10-05 DIAGNOSIS — Z8709 Personal history of other diseases of the respiratory system: Secondary | ICD-10-CM | POA: Insufficient documentation

## 2012-10-05 DIAGNOSIS — Z9889 Other specified postprocedural states: Secondary | ICD-10-CM | POA: Insufficient documentation

## 2012-10-05 DIAGNOSIS — Z8739 Personal history of other diseases of the musculoskeletal system and connective tissue: Secondary | ICD-10-CM | POA: Insufficient documentation

## 2012-10-05 LAB — COMPREHENSIVE METABOLIC PANEL
ALT: 19 U/L (ref 0–35)
AST: 23 U/L (ref 0–37)
Alkaline Phosphatase: 74 U/L (ref 39–117)
GFR calc Af Amer: >90 mL/min (ref >90)
Glucose, Bld: 90 mg/dL (ref 70–99)
Potassium: 3.7 mEq/L (ref 3.5–5.1)
Sodium: 141 mEq/L (ref 135–145)
Total Protein: 7.8 g/dL (ref 6.0–8.3)

## 2012-10-05 LAB — URINALYSIS, ROUTINE W REFLEX MICROSCOPIC
Bilirubin Urine: NEGATIVE
Glucose, UA: NEGATIVE mg/dL
Specific Gravity, Urine: 1.016 (ref 1.005–1.030)
pH: 6.5 (ref 5.0–8.0)

## 2012-10-05 LAB — URINE MICROSCOPIC-ADD ON

## 2012-10-05 LAB — CBC WITH DIFFERENTIAL/PLATELET
Eosinophils Absolute: 0.3 10*3/uL (ref 0.0–0.7)
Lymphocytes Relative: 35 % (ref 12–46)
Lymphs Abs: 3.3 10*3/uL (ref 0.7–4.0)
MCH: 29.8 pg (ref 26.0–34.0)
Neutrophils Relative %: 56 % (ref 43–77)
Platelets: 278 10*3/uL (ref 150–400)
RBC: 4.66 MIL/uL (ref 3.87–5.11)
WBC: 9.4 10*3/uL (ref 4.0–10.5)

## 2012-10-05 MED ORDER — KETOROLAC TROMETHAMINE 30 MG/ML IJ SOLN
30.0000 mg | Freq: Once | INTRAMUSCULAR | Status: DC
  Filled 2012-10-05: qty 1

## 2012-10-05 MED ORDER — ONDANSETRON HCL 4 MG/2ML IJ SOLN
4.0000 mg | Freq: Once | INTRAMUSCULAR | Status: AC
  Administered 2012-10-05: 4 mg via INTRAVENOUS
  Filled 2012-10-05: qty 2

## 2012-10-05 MED ORDER — SODIUM CHLORIDE 0.9 % IV BOLUS (SEPSIS)
1000.0000 mL | Freq: Once | INTRAVENOUS | Status: AC
Start: 2012-10-05 — End: 2012-10-05
  Administered 2012-10-05: 1000 mL via INTRAVENOUS

## 2012-10-05 NOTE — ED Notes (Signed)
Pt is here for for evaluation of bilateral flank pain which began on thanksgiving.  Pt has hx of kidney stones and states that the pain feels the way it normally does when "it starts", no GU symptoms.  No fever or chills.  Pt also has nausea wit this.

## 2012-10-05 NOTE — ED Notes (Addendum)
Patient ambulatory to CT

## 2012-10-05 NOTE — ED Provider Notes (Signed)
History     CSN: [id number]  Arrival date & time 10/05/12  4098   First MD Initiated Contact with Patient 10/05/12 [phone]      Chief Complaint  Patient presents with  . Flank Pain    (Consider location/radiation/quality/duration/timing/severity/associated sxs/prior treatment) HPI  Patient with bilateral flank pain 1.5 weeks ago eased off then worsened Sunday , Tuesday night, Urinalysis done at work on Wednesday.  Patient reports wbc and rbc but culture negative.  Patient with history of kidney stones.  Pain now on left flank.  No radiation to abdomen but feels like prior kidney stones.  No uti symptoms- no frequency, burning, fever, or chills.  No injury, strain, or heavy lifiting.    Past Medical History  Diagnosis Date  . Palpitations   . Orthostatic lightheadedness   . Pain in joint   . Hypertension   . Major depression   . Asthma     PAST HX OF ASTHMA WHILE TAKING LISINOPRIL--NO PROBLEMS SINCE DISCONTINUING LISINOPRIL  . Shortness of breath     WITH EXERTION-PT RELATES TO HER WEIGHT  . Obstructive sleep apnea     CPAP EVERY NIGHT-SETTING IS 8  . Blood transfusion     AGE 61 -HEMORRHAGED AFTER TONSILLECTOMY  . Protein in urine   . GERD (gastroesophageal reflux disease)     PAST HX  H-PYLORI   . H/O hiatal hernia   . Arthritis     PAIN AND OA RIGHT KNEE  . PONV (postoperative nausea and vomiting)   . Obesity   . H. pylori infection     Hx of   . Hyperlipemia   . Kidney stones     Past Surgical History  Procedure Date  . Bladder surgery   . Tubal ligation 1979  . Cesarean section 1979  . Abdominal hysterectomy 1999  . Neck sx for ruptured disc c4-5 2001  . Tonsillectomy age 31  . Cholecystectomy 3-08    Dr Kendrick Ranch  . Left knee replacement 2008  . Total knee arthroplasty 11/14/2011    Procedure: TOTAL KNEE ARTHROPLASTY;  Surgeon: [name], MD;  Location: [facility];  Service: Orthopedics;  Laterality: Right;  combined with general block  . Hand surgery    Bilateral   . Esophageal manometry 09/02/2012    Procedure: ESOPHAGEAL MANOMETRY (EM);  Surgeon: Mardella Layman, MD;  Location: [facility];  Service: Endoscopy;  Laterality: N/A;    Family History  Problem Relation Age of Onset  . Alzheimer's disease Mother   . Hypertension Mother   . Hyperlipidemia Mother   . Alcohol abuse Father   . Cancer Father   . COPD Father   . Colon cancer Neg Hx   . Lung cancer Father   . Diverticulitis Mother   . Skin cancer Father   . Prostate cancer Father     History  Substance Use Topics  . Smoking status: Never Smoker   . Smokeless tobacco: Never Used  . Alcohol Use: No    OB History    Grav Para Term Preterm Abortions TAB SAB Ect Mult Living                  Review of Systems  Constitutional: Negative for fever, chills, activity change, appetite change and unexpected weight change.  HENT: Negative for sore throat, rhinorrhea, neck pain, neck stiffness and sinus pressure.   Eyes: Negative for visual disturbance.  Respiratory: Negative for cough and shortness of breath.   Cardiovascular:  Negative for chest pain and leg swelling.  Gastrointestinal: Negative for vomiting, abdominal pain, diarrhea and blood in stool.  Genitourinary: Negative for dysuria, urgency, frequency, vaginal discharge and difficulty urinating.  Musculoskeletal: Negative for myalgias, arthralgias and gait problem.  Skin: Negative for color change and rash.  Neurological: Negative for weakness, light-headedness and headaches.  Hematological: Does not bruise/bleed easily.  Psychiatric/Behavioral: Negative for dysphoric mood.    Allergies  Dilaudid; Hydromorphone; Acetaminophen; Erythromycin ethylsuccinate; Lisinopril; Losartan potassium-hctz; Morphine sulfate; Sulfonamide derivatives; Valsartan; and Latex  Home Medications   Current Outpatient Rx  Name  Route  Sig  Dispense  Refill  . ALISKIREN-HYDROCHLOROTHIAZIDE 300-12.5 MG PO TABS   Oral   Take 1 tablet  by mouth daily before breakfast.          . AMBULATORY NON FORMULARY MEDICATION      CPAP Machine Uses at bedtime as directed         . HYDROCHLOROTHIAZIDE 25 MG PO TABS   Oral   Take 12.5 mg by mouth daily.         . ALBUTEROL SULFATE HFA 108 (90 BASE) MCG/ACT IN AERS   Inhalation   Inhale 2 puffs into the lungs every 6 (six) hours as needed for wheezing.   1 Inhaler   3   . BECLOMETHASONE DIPROPIONATE 80 MCG/ACT IN AERS   Inhalation   Inhale 1 puff into the lungs as needed.   1 Inhaler   1   . CHLORPHENIRAMINE MALEATE 4 MG PO TABS   Oral   Take 4 mg by mouth as needed.          . MOMETASONE FURO-FORMOTEROL FUM 100-5 MCG/ACT IN AERO   Inhalation   Inhale 2 puffs into the lungs 2 (two) times daily.   13 g   2   . POLYETHYLENE GLYCOL 3350 PO POWD   Oral   Take by mouth as needed.            BP 149/73  Pulse 87  Temp 97.9 F (36.6 C) (Oral)  Resp 18  SpO2 98%  Physical Exam  Nursing note and vitals reviewed. Constitutional: She is oriented to person, place, and time. She appears well-developed and well-nourished.  HENT:  Head: Normocephalic and atraumatic.  Eyes: Pupils are equal, round, and reactive to light.  Neck: Normal range of motion. Neck supple.  Cardiovascular: Normal rate and regular rhythm.   Pulmonary/Chest: Effort normal.  Abdominal: Soft. There is tenderness.       Mild diffuse left sided tenderness  Neurological: She is alert and oriented to person, place, and time.  Skin: Skin is warm and dry.  Psychiatric: She has a normal mood and affect. Her behavior is normal.    ED Course  Procedures (including critical care time)   Labs Reviewed  URINALYSIS, ROUTINE W REFLEX MICROSCOPIC   No results found.   No diagnosis found.    MDM  Reviewed labs and ct with patient.  Patient had negative culture per her report.  Patient with intrarenal stones but no ureteral stone.  No obvious source of infection or pain. Discussed with  patient to return if any change in symptoms or be reevaluated if symptoms continue beyond 24 hours.        Hilario Quarry, MD 10/05/12 1245

## 2012-12-07 ENCOUNTER — Encounter (HOSPITAL_BASED_OUTPATIENT_CLINIC_OR_DEPARTMENT_OTHER): Payer: Self-pay

## 2012-12-07 ENCOUNTER — Emergency Department (HOSPITAL_BASED_OUTPATIENT_CLINIC_OR_DEPARTMENT_OTHER)
Admission: EM | Admit: 2012-12-07 | Discharge: 2012-12-08 | Disposition: A | Discharge status: Home / Self Care | Payer: BC Managed Care – PPO | Attending: Emergency Medicine | Admitting: Emergency Medicine

## 2012-12-07 DIAGNOSIS — G4733 Obstructive sleep apnea (adult) (pediatric): Secondary | ICD-10-CM | POA: Insufficient documentation

## 2012-12-07 DIAGNOSIS — Z87442 Personal history of urinary calculi: Secondary | ICD-10-CM | POA: Insufficient documentation

## 2012-12-07 DIAGNOSIS — I10 Essential (primary) hypertension: Secondary | ICD-10-CM | POA: Insufficient documentation

## 2012-12-07 DIAGNOSIS — Z9981 Dependence on supplemental oxygen: Secondary | ICD-10-CM | POA: Insufficient documentation

## 2012-12-07 DIAGNOSIS — IMO0002 Reserved for concepts with insufficient information to code with codable children: Secondary | ICD-10-CM | POA: Insufficient documentation

## 2012-12-07 DIAGNOSIS — Z862 Personal history of diseases of the blood and blood-forming organs and certain disorders involving the immune mechanism: Secondary | ICD-10-CM | POA: Insufficient documentation

## 2012-12-07 DIAGNOSIS — R5381 Other malaise: Secondary | ICD-10-CM | POA: Insufficient documentation

## 2012-12-07 DIAGNOSIS — IMO0001 Reserved for inherently not codable concepts without codable children: Secondary | ICD-10-CM | POA: Insufficient documentation

## 2012-12-07 DIAGNOSIS — R112 Nausea with vomiting, unspecified: Secondary | ICD-10-CM | POA: Insufficient documentation

## 2012-12-07 DIAGNOSIS — Z8619 Personal history of other infectious and parasitic diseases: Secondary | ICD-10-CM | POA: Insufficient documentation

## 2012-12-07 DIAGNOSIS — R42 Dizziness and giddiness: Secondary | ICD-10-CM | POA: Insufficient documentation

## 2012-12-07 DIAGNOSIS — Z8639 Personal history of other endocrine, nutritional and metabolic disease: Secondary | ICD-10-CM | POA: Insufficient documentation

## 2012-12-07 DIAGNOSIS — R1084 Generalized abdominal pain: Secondary | ICD-10-CM | POA: Insufficient documentation

## 2012-12-07 DIAGNOSIS — J3489 Other specified disorders of nose and nasal sinuses: Secondary | ICD-10-CM | POA: Insufficient documentation

## 2012-12-07 DIAGNOSIS — A088 Other specified intestinal infections: Secondary | ICD-10-CM | POA: Insufficient documentation

## 2012-12-07 DIAGNOSIS — Z8679 Personal history of other diseases of the circulatory system: Secondary | ICD-10-CM | POA: Insufficient documentation

## 2012-12-07 DIAGNOSIS — A084 Viral intestinal infection, unspecified: Secondary | ICD-10-CM

## 2012-12-07 DIAGNOSIS — J45909 Unspecified asthma, uncomplicated: Secondary | ICD-10-CM | POA: Insufficient documentation

## 2012-12-07 DIAGNOSIS — R197 Diarrhea, unspecified: Secondary | ICD-10-CM | POA: Insufficient documentation

## 2012-12-07 DIAGNOSIS — E669 Obesity, unspecified: Secondary | ICD-10-CM | POA: Insufficient documentation

## 2012-12-07 DIAGNOSIS — Z79899 Other long term (current) drug therapy: Secondary | ICD-10-CM | POA: Insufficient documentation

## 2012-12-07 DIAGNOSIS — R51 Headache: Secondary | ICD-10-CM | POA: Insufficient documentation

## 2012-12-07 DIAGNOSIS — Z8739 Personal history of other diseases of the musculoskeletal system and connective tissue: Secondary | ICD-10-CM | POA: Insufficient documentation

## 2012-12-07 DIAGNOSIS — Z8719 Personal history of other diseases of the digestive system: Secondary | ICD-10-CM | POA: Insufficient documentation

## 2012-12-07 LAB — COMPREHENSIVE METABOLIC PANEL
ALT: 26 U/L (ref 0–35)
AST: 33 U/L (ref 0–37)
Albumin: 4 g/dL (ref 3.5–5.2)
CO2: 25 mEq/L (ref 19–32)
Calcium: 9.2 mg/dL (ref 8.4–10.5)
Chloride: 98 mEq/L (ref 96–112)
Creatinine, Ser: 0.9 mg/dL (ref 0.50–1.10)
GFR calc non Af Amer: 68 mL/min — ABNORMAL LOW (ref >90)
Sodium: 135 mEq/L (ref 135–145)

## 2012-12-07 LAB — CBC
MCH: 29.8 pg (ref 26.0–34.0)
Platelets: 250 10*3/uL (ref 150–400)
RBC: 4.7 MIL/uL (ref 3.87–5.11)
RDW: 13.8 % (ref 11.5–15.5)
WBC: 10.2 10*3/uL (ref 4.0–10.5)

## 2012-12-07 MED ORDER — ONDANSETRON HCL 4 MG/2ML IJ SOLN
4.0000 mg | Freq: Once | INTRAMUSCULAR | Status: AC
  Administered 2012-12-07: 4 mg via INTRAVENOUS
  Filled 2012-12-07: qty 2

## 2012-12-07 MED ORDER — POTASSIUM CHLORIDE 10 MEQ/100ML IV SOLN
10.0000 meq | Freq: Once | INTRAVENOUS | Status: AC
  Administered 2012-12-07: 10 meq via INTRAVENOUS
  Filled 2012-12-07: qty 100

## 2012-12-07 MED ORDER — SODIUM CHLORIDE 0.9 % IV BOLUS (SEPSIS)
1000.0000 mL | Freq: Once | INTRAVENOUS | Status: AC
  Administered 2012-12-07: 1000 mL via INTRAVENOUS

## 2012-12-07 MED ORDER — ONDANSETRON HCL 4 MG PO TABS: 4.0000 mg | ORAL_TABLET | Freq: Three times a day (TID) | ORAL | Status: DC | PRN

## 2012-12-07 NOTE — ED Notes (Signed)
Patient reports that she developed abdominal cramping with diarrhea this afternoon. As the evening went on she developed vomiting with fever, multiple episodes of both.

## 2012-12-07 NOTE — ED Provider Notes (Signed)
I saw and evaluated the patient, reviewed the resident's note and I agree with the findings and plan.   .Face to face Exam:  General:  Awake HEENT:  Atraumatic Resp:  Normal effort Abd:  Nondistended Neuro:No focal weakness Lymph: No adenopathy   Nelia Shi, MD 12/07/12 2304

## 2012-12-07 NOTE — ED Provider Notes (Signed)
History    CSN: [id number]  Arrival date & time 12/07/12  2040   First MD Initiated Contact with Patient 12/07/12 2119      Chief Complaint  Patient presents with  . fever, nausea     HPI Pt is a 62 yo F with PMH of HTN, asthma, obesity presenting with 1 day history of progressively worsening diarrhea, N/V and fever. Pt states she had acute onset diarrhea earlier this morning. She states that later today she began having nausea and vomiting at least 7 times. She denies bloody stool or emesis. She states she has no known sick contacts but she does work in a healthcare facility. She has not been able to tolerate any PO medication for her fever. She decided to come in for evaluation given the amount of vomiting and diarrhea she is having. She currently endorses HA, body aches, abd cramping and symptoms listed above.   Past Medical History  Diagnosis Date  . Palpitations   . Orthostatic lightheadedness   . Pain in joint   . Hypertension   . Asthma     PAST HX OF ASTHMA WHILE TAKING LISINOPRIL--NO PROBLEMS SINCE DISCONTINUING LISINOPRIL  . Shortness of breath     WITH EXERTION-PT RELATES TO HER WEIGHT  . Obstructive sleep apnea     CPAP EVERY NIGHT-SETTING IS 8  . Blood transfusion     AGE 70 -HEMORRHAGED AFTER TONSILLECTOMY  . Protein in urine   . GERD (gastroesophageal reflux disease)     PAST HX  H-PYLORI   . H/O hiatal hernia   . Arthritis     PAIN AND OA RIGHT KNEE  . PONV (postoperative nausea and vomiting)   . Obesity   . H. pylori infection     Hx of   . Hyperlipemia   . Kidney stones     Past Surgical History  Procedure Laterality Date  . Bladder surgery    . Tubal ligation  1979  . Cesarean section  1979  . Abdominal hysterectomy  1999  . Neck sx for ruptured disc c4-5  2001  . Tonsillectomy  age 26  . Cholecystectomy  3-08    Dr Kendrick Ranch  . Left knee replacement  2008  . Total knee arthroplasty  11/14/2011    Procedure: TOTAL KNEE ARTHROPLASTY;  Surgeon:  [name], MD;  Location: [facility];  Service: Orthopedics;  Laterality: Right;  combined with general block  . Hand surgery      Bilateral   . Esophageal manometry  09/02/2012    Procedure: ESOPHAGEAL MANOMETRY (EM);  Surgeon: Mardella Layman, MD;  Location: [facility];  Service: Endoscopy;  Laterality: N/A;    Family History  Problem Relation Age of Onset  . Alzheimer's disease Mother   . Hypertension Mother   . Hyperlipidemia Mother   . Alcohol abuse Father   . Cancer Father   . COPD Father   . Colon cancer Neg Hx   . Lung cancer Father   . Diverticulitis Mother   . Skin cancer Father   . Prostate cancer Father     History  Substance Use Topics  . Smoking status: Never Smoker   . Smokeless tobacco: Never Used  . Alcohol Use: No    OB History   Grav Para Term Preterm Abortions TAB SAB Ect Mult Living                  Review of Systems  Constitutional: Positive for  fever, chills, activity change, appetite change and fatigue.  HENT: Positive for congestion. Negative for trouble swallowing and neck stiffness.   Eyes: Negative for visual disturbance.  Respiratory: Negative for chest tightness and shortness of breath.   Cardiovascular: Negative for chest pain.  Gastrointestinal: Positive for nausea, vomiting, abdominal pain and diarrhea. Negative for blood in stool.  Genitourinary: Negative for difficulty urinating.  Musculoskeletal: Positive for myalgias.  Skin: Negative for rash.  Neurological: Positive for light-headedness and headaches.  All other systems reviewed and are negative.    Allergies  Dilaudid; Hydromorphone; Acetaminophen; Erythromycin ethylsuccinate; Lisinopril; Losartan potassium-hctz; Morphine sulfate; Sulfonamide derivatives; Valsartan; and Latex  Home Medications   Current Outpatient Rx  Name  Route  Sig  Dispense  Refill  . albuterol (PROVENTIL HFA;VENTOLIN HFA) 108 (90 BASE) MCG/ACT inhaler   Inhalation   Inhale 2 puffs into the  lungs every 6 (six) hours as needed for wheezing.   1 Inhaler   3   . [place]iren-Hydrochlorothiazide (TEKTURNA HCT) 300-12.5 MG TABS   Oral   Take 1 tablet by mouth daily before breakfast.          . AMBULATORY NON FORMULARY MEDICATION      CPAP Machine Uses at bedtime as directed         . beclomethasone (QVAR) 80 MCG/ACT inhaler   Inhalation   Inhale 1 puff into the lungs as needed.   1 Inhaler   1   . chlorpheniramine (RA CHLORPHENIRAMINE MALEATE) 4 MG tablet   Oral   Take 4 mg by mouth as needed.          . hydrochlorothiazide (HYDRODIURIL) 25 MG tablet   Oral   Take 12.5 mg by mouth daily.         . mometasone-formoterol (DULERA) 100-5 MCG/ACT AERO   Inhalation   Inhale 2 puffs into the lungs 2 (two) times daily.   13 g   2   . ondansetron (ZOFRAN) 4 MG tablet   Oral   Take 1 tablet (4 mg total) by mouth every 8 (eight) hours as needed for nausea.   20 tablet   0   . polyethylene glycol powder (GLYCOLAX/MIRALAX) powder   Oral   Take by mouth as needed.            BP 118/69  Pulse 125  Temp(Src) 101.4 F (38.6 C) (Oral)  Resp 22  SpO2 95%  Physical Exam  Constitutional: She is oriented to person, place, and time. She appears well-developed and well-nourished. She has a sickly appearance.  HENT:  Head: Normocephalic and atraumatic.  Mouth/Throat: Oropharynx is clear and moist.  Neck: Normal range of motion.  Cardiovascular: Normal rate, regular rhythm and normal heart sounds.   Pulmonary/Chest: Effort normal and breath sounds normal.  Abdominal: Soft. There is generalized tenderness. There is no rebound, no guarding and no CVA tenderness.  Obese. Tenderness is mild at this time  Musculoskeletal: Normal range of motion. She exhibits no edema.  Lymphadenopathy:    She has no cervical adenopathy.  Neurological: She is alert and oriented to person, place, and time. No cranial nerve deficit.  Skin: Skin is warm and dry. No rash noted. She is  not diaphoretic.    ED Course  Procedures (including critical care time)  Labs Reviewed  COMPREHENSIVE METABOLIC PANEL - Abnormal; Notable for the following:    Potassium 3.3 (*)    Glucose, Bld 125 (*)    GFR calc non Af Amer 68 (*)  GFR calc Af Amer 78 (*)    All other components within normal limits  CBC   No results found.   1. Viral gastroenteritis     MDM  62 yo F with acute onset N/V/D and abdominal pain most likely secondary to viral gastroenteritis  2150- Pt with multiple episodes of vomiting and diarrhea. Will start IVF and check CBC and Cmet. Give Zofran for nausea. Once able to tolerate PO will treat with Advil for fever since she is allergic to Tylenol. 2220- Pt feeling somewhat better after Zofran and starting IVF. Labs wnl except low K. Current illness is likely due to viral gastroenteritis. Will finish current 1L bolus, and then give Kcl. If patient is able to tolerate PO after fluids can likely be d/c home.   Hilarie Fredrickson, MD 12/07/12 [phone]

## 2012-12-08 NOTE — ED Notes (Signed)
rx x 1 for zofran and worknote given

## 2012-12-14 ENCOUNTER — Other Ambulatory Visit: Payer: Self-pay

## 2013-04-15 ENCOUNTER — Ambulatory Visit (INDEPENDENT_AMBULATORY_CARE_PROVIDER_SITE_OTHER): Payer: BC Managed Care – PPO | Admitting: Family Medicine

## 2013-04-15 ENCOUNTER — Encounter: Payer: Self-pay | Admitting: Family Medicine

## 2013-04-15 VITALS — BP 131/79 | HR 94 | Temp 98.4°F | Wt 287.0 lb

## 2013-04-15 DIAGNOSIS — R002 Palpitations: Secondary | ICD-10-CM

## 2013-04-15 NOTE — Progress Notes (Signed)
CC: Alison Richards is a 62 y.o. female is here for heart flutters   Subjective: HPI:  Patient complains of heart fluttering but has been present for 3 weeks. It is noticed on a daily basis. Most noticeable when leaning forward or after quickly sitting up. Episodes last seconds and and without intervention. Described as a crawling sensation up behind her sternum from her heart. Since this is been occurring she has had intermittent nausea not associated episodes of fluttering, has also had on and off shoulder blade pain not associated episodes of fluttering or nausea.  Symptoms are only noticed when awake, not interfering with sleep, since onset has not been getting better or worse. Other than above nothing else makes better then worse. Severity is described as mild to moderate encompassing all the complaints above. She denies fevers, chills, vomiting, appetite change, motor or sensory disturbances, radiation of back pain, headache, lightheadedness, dizziness, confusion, abdominal pain, GI disturbance nor dysuria   Review Of Systems Outlined In HPI  Past Medical History  Diagnosis Date  . Palpitations   . Orthostatic lightheadedness   . Pain in joint   . Hypertension   . Asthma     PAST HX OF ASTHMA WHILE TAKING LISINOPRIL--NO PROBLEMS SINCE DISCONTINUING LISINOPRIL  . Shortness of breath     WITH EXERTION-PT RELATES TO HER WEIGHT  . Obstructive sleep apnea     CPAP EVERY NIGHT-SETTING IS 8  . Blood transfusion     AGE 37 -HEMORRHAGED AFTER TONSILLECTOMY  . Protein in urine   . GERD (gastroesophageal reflux disease)     PAST HX  H-PYLORI   . H/O hiatal hernia   . Arthritis     PAIN AND OA RIGHT KNEE  . PONV (postoperative nausea and vomiting)   . Obesity   . H. pylori infection     Hx of   . Hyperlipemia   . Kidney stones      Family History  Problem Relation Age of Onset  . Alzheimer's disease Mother   . Hypertension Mother   . Hyperlipidemia Mother   . Alcohol abuse  Father   . Cancer Father   . COPD Father   . Colon cancer Neg Hx   . Lung cancer Father   . Diverticulitis Mother   . Skin cancer Father   . Prostate cancer Father      History  Substance Use Topics  . Smoking status: Never Smoker   . Smokeless tobacco: Never Used  . Alcohol Use: No     Objective: Filed Vitals:   04/15/13 1450  BP: 131/79  Pulse: 94  Temp: 98.4 F (36.9 C)    General: Alert and Oriented, No Acute Distress HEENT: Pupils equal, round, reactive to light. Conjunctivae clear.  External ears unremarkable, canals clear with intact TMs with appropriate landmarks.  Middle ear appears open without effusion. Pink inferior turbinates.  Moist mucous membranes, pharynx without inflammation nor lesions.  Neck supple without palpable lymphadenopathy nor abnormal masses. Lungs: Clear to auscultation bilaterally, no wheezing/ronchi/rales.  Comfortable work of breathing. Good air movement. Cardiac: Regular rate and rhythm. Normal S1/S2.  No murmurs, rubs, nor gallops.   Back: No midline spinous process tenderness pain is not reproduced with flexion or extension nor rotational maneuvers no overlying skin changes Abdomen: Obese soft nontender Extremities: No peripheral edema.  Strong peripheral pulses.  Mental Status: No depression, anxiety, nor agitation. Skin: Warm and dry.  EKG obtained showing normal sinus rhythm, left axis deviation, normal  intervals however prolonged QTC at , no pathologic Q waves, no ST segment elevation or depression, there is normal T wave morphology  Assessment & Plan: Amberli was seen today for heart flutters.  Diagnoses and associated orders for this visit:  Palpitations - COMPLETE METABOLIC PANEL WITH GFR - CBC - TSH    Palpitations: She has a history of hypokalemia we will look into this along with anemia and thyroid abnormality with electrolyte abnormalities before considering Holter monitor versus echocardiogram. Nausea: She  declines medications citing infrequent and non-bothersome episodes  Return in about 1 week (around 04/22/2013).

## 2013-04-16 ENCOUNTER — Telehealth: Payer: Self-pay | Admitting: Family Medicine

## 2013-04-16 DIAGNOSIS — R002 Palpitations: Secondary | ICD-10-CM

## 2013-04-16 LAB — CBC
MCV: 86.1 fL (ref 78.0–100.0)
Platelets: 279 10*3/uL (ref 150–400)
RBC: 4.46 MIL/uL (ref 3.87–5.11)
RDW: 14.2 % (ref 11.5–15.5)
WBC: 11.9 10*3/uL — ABNORMAL HIGH (ref 4.0–10.5)

## 2013-04-16 LAB — COMPLETE METABOLIC PANEL WITH GFR
AST: 19 U/L (ref 0–37)
Albumin: 3.9 g/dL (ref 3.5–5.2)
BUN: 18 mg/dL (ref 6–23)
CO2: 29 mEq/L (ref 19–32)
Calcium: 9.2 mg/dL (ref 8.4–10.5)
Chloride: 102 mEq/L (ref 96–112)
Creat: 0.78 mg/dL (ref 0.50–1.10)
GFR, Est African American: >89 mL/min
GFR, Est Non African American: 82 mL/min
Glucose, Bld: 85 mg/dL (ref 70–99)
Potassium: 3.8 mEq/L (ref 3.5–5.3)

## 2013-04-16 LAB — TSH: TSH: 4.4 u[IU]/mL (ref 0.350–4.500)

## 2013-04-16 NOTE — Telephone Encounter (Signed)
Alison Richards, Will you please let Alison Richards know that her blood work did not show a reason for her palpitations.  I'd like to make sure this is not atrial fibrillation by using a 48 hour holter monitor.  (Order printed place in inbox for [place] on [address], not sure if this needs a call).  Please let our offic eknow if she has not been contacted about an appt for this within the next week.

## 2013-04-16 NOTE — Telephone Encounter (Signed)
Pt notified. She has already been contacted by [employer]

## 2013-04-17 ENCOUNTER — Encounter: Payer: Self-pay | Admitting: *Deleted

## 2013-04-18 ENCOUNTER — Ambulatory Visit (INDEPENDENT_AMBULATORY_CARE_PROVIDER_SITE_OTHER): Payer: BC Managed Care – PPO

## 2013-04-18 DIAGNOSIS — R002 Palpitations: Secondary | ICD-10-CM

## 2013-04-18 NOTE — Progress Notes (Signed)
Placed a 48 hr holter monitor on patient 

## 2013-05-08 ENCOUNTER — Encounter: Payer: Self-pay | Admitting: Family Medicine

## 2013-05-08 ENCOUNTER — Telehealth: Payer: Self-pay | Admitting: Family Medicine

## 2013-05-08 DIAGNOSIS — R002 Palpitations: Secondary | ICD-10-CM

## 2013-05-08 NOTE — Telephone Encounter (Signed)
Alison Richards, Will you please let Alison Richards know that I'm sorry she didn't hear back from the Holter by now, it looks like the results never got sent to me or Dr. Judie Petit.  Her results do not show anything dangerous but does confirm that the flutter sensation is coming from the heart. The top of her heart is sending out an extra unnecessary beat now and then that gives this sensation.  It's not a rhythm that requires medication nor will it affect her general health in the long run.  However, there are medications to try and buffer this from happening if the sensation is bothering her quality of life.  If she's interested in this Dr. Judie Petit or I could go over options at a follow up visit.

## 2013-05-08 NOTE — Telephone Encounter (Signed)
Pt notified and voiced understanding. Pt wants to hold off on medications at this time but if she decides to call back she will let us know

## 2013-09-04 ENCOUNTER — Other Ambulatory Visit: Payer: Self-pay

## 2013-12-22 ENCOUNTER — Encounter (HOSPITAL_BASED_OUTPATIENT_CLINIC_OR_DEPARTMENT_OTHER): Payer: Self-pay | Admitting: *Deleted

## 2013-12-22 NOTE — Progress Notes (Signed)
To get bmet at [facility] for kidney dr Will bring cpap and use it post op

## 2013-12-23 ENCOUNTER — Other Ambulatory Visit: Payer: Self-pay | Admitting: Orthopedic Surgery

## 2013-12-23 ENCOUNTER — Ambulatory Visit (INDEPENDENT_AMBULATORY_CARE_PROVIDER_SITE_OTHER): Payer: BC Managed Care – PPO

## 2013-12-23 ENCOUNTER — Encounter: Payer: Self-pay | Admitting: Family Medicine

## 2013-12-23 ENCOUNTER — Ambulatory Visit (INDEPENDENT_AMBULATORY_CARE_PROVIDER_SITE_OTHER): Payer: BC Managed Care – PPO | Admitting: Family Medicine

## 2013-12-23 VITALS — BP 155/83 | HR 98 | Wt 294.0 lb

## 2013-12-23 DIAGNOSIS — R0602 Shortness of breath: Secondary | ICD-10-CM

## 2013-12-23 DIAGNOSIS — R42 Dizziness and giddiness: Secondary | ICD-10-CM

## 2013-12-23 DIAGNOSIS — R062 Wheezing: Secondary | ICD-10-CM

## 2013-12-23 DIAGNOSIS — H547 Unspecified visual loss: Secondary | ICD-10-CM

## 2013-12-23 LAB — CBC
HCT: 40.3 % (ref 36.0–46.0)
HEMOGLOBIN: 13.8 g/dL (ref 12.0–15.0)
MCH: 29.7 pg (ref 26.0–34.0)
MCHC: 34.2 g/dL (ref 30.0–36.0)
MCV: 86.7 fL (ref 78.0–100.0)
PLATELETS: 295 10*3/uL (ref 150–400)
RBC: 4.65 MIL/uL (ref 3.87–5.11)
RDW: 13.8 % (ref 11.5–15.5)
WBC: 9.8 10*3/uL (ref 4.0–10.5)

## 2013-12-23 LAB — COMPLETE METABOLIC PANEL WITH GFR
ALT: 23 U/L (ref 0–35)
AST: 25 U/L (ref 0–37)
Albumin: 4.4 g/dL (ref 3.5–5.2)
Alkaline Phosphatase: 61 U/L (ref 39–117)
BILIRUBIN TOTAL: 0.4 mg/dL (ref 0.2–1.2)
BUN: 15 mg/dL (ref 6–23)
CO2: 27 meq/L (ref 19–32)
CREATININE: 0.75 mg/dL (ref 0.50–1.10)
Calcium: 9.4 mg/dL (ref 8.4–10.5)
Chloride: 103 mEq/L (ref 96–112)
GFR, EST NON AFRICAN AMERICAN: 86 mL/min
GLUCOSE: 100 mg/dL — AB (ref 70–99)
Potassium: 3.9 mEq/L (ref 3.5–5.3)
Sodium: 141 mEq/L (ref 135–145)
TOTAL PROTEIN: 7.7 g/dL (ref 6.0–8.3)

## 2013-12-23 NOTE — Progress Notes (Signed)
CC: Alison Richards is a 63 y.o. female is here for Dizziness   Subjective: HPI:  Patient complains of shortness of breath has been present since Christmas of 2014. It is present on a daily basis absent with rest mild to moderate severity with walking the distance of a parking. Nothing else makes better or worse. She has not been using any of her rescue inhalers or control her inhalers for well over 3 months.  Accompanied with nonproductive cough every morning which gradually improves throughout the day without intervention. Denies chest pain, irregular heartbeat, PND, orthopnea. She does admit to occasional wheezing throughout the day since symptoms have been present. Denies fevers, chills, bleeding, bruising, confusion, motor or sensory disturbances.  Today's visit was prompted by an upcoming surgery on Thursday she wants to know if there's anything she should worry about her shortness of breath prior to anesthesia. Review of systems she does admit to bilateral mild vision loss worse in the evenings and dark environments over the past 6 months.  Mentions that she has had some dizziness it is hard to define as mild in severity comes and goes unpredictably less than most days of the week last for matter of seconds. Nothing particularly makes better or worse she's unsure when this actually started.  Denies nasal congestion, hearing loss, ringing in ears nor motor or sensory disturbances.  Review Of Systems Outlined In HPI  Past Medical History  Diagnosis Date  . Palpitations   . Orthostatic lightheadedness   . Pain in joint   . Hypertension   . Asthma     PAST HX OF ASTHMA WHILE TAKING LISINOPRIL--NO PROBLEMS SINCE DISCONTINUING LISINOPRIL  . Shortness of breath     WITH EXERTION-PT RELATES TO HER WEIGHT  . Obstructive sleep apnea     CPAP EVERY NIGHT-SETTING IS 8  . Blood transfusion     AGE 7 -HEMORRHAGED AFTER TONSILLECTOMY  . Protein in urine   . GERD (gastroesophageal reflux  disease)     PAST HX  H-PYLORI   . H/O hiatal hernia   . Arthritis     PAIN AND OA RIGHT KNEE  . PONV (postoperative nausea and vomiting)   . Obesity   . H. pylori infection     Hx of   . Hyperlipemia   . Kidney stones   . Wears dentures     full top  . Wears glasses     Past Surgical History  Procedure Laterality Date  . Bladder surgery    . Tubal ligation  1979  . Cesarean section  1979  . Abdominal hysterectomy  1999  . Neck sx for ruptured disc c4-5  2001  . Tonsillectomy  age 55  . Cholecystectomy  3-08    Dr Lennie Hummer  . Left knee replacement  2008  . Total knee arthroplasty  11/14/2011    Procedure: TOTAL KNEE ARTHROPLASTY;  Surgeon: Mauri Pole, MD;  Location: [facility];  Service: Orthopedics;  Laterality: Right;  combined with general block  . Hand surgery      Bilateral   . Esophageal manometry  09/02/2012    Procedure: ESOPHAGEAL MANOMETRY (EM);  Surgeon: Sable Feil, MD;  Location: [facility];  Service: Endoscopy;  Laterality: N/A;  . Colonoscopy     Family History  Problem Relation Age of Onset  . Alzheimer's disease Mother   . Hypertension Mother   . Hyperlipidemia Mother   . Alcohol abuse Father   . Cancer Father   .  COPD Father   . Colon cancer Neg Hx   . Lung cancer Father   . Diverticulitis Mother   . Skin cancer Father   . Prostate cancer Father     History   Social History  . Marital Status: Married    Spouse Name: Herbie Baltimore    Number of Children: 3  . Years of Education: N/A   Occupational History  . LAB [employer]    Social History Main Topics  . Smoking status: Never Smoker   . Smokeless tobacco: Never Used  . Alcohol Use: No  . Drug Use: No  . Sexual Activity: Yes    Partners: Male   Other Topics Concern  . Not on file   Social History Narrative   Daily caffeine      Objective: BP 155/83  Pulse 98  Wt 294 lb (133.358 kg)  SpO2 95%  General: Alert and Oriented, No Acute Distress HEENT: Pupils equal, round, reactive to  light. Conjunctivae clear.  External ears unremarkable, canals clear with intact TMs with appropriate landmarks.  Middle ear appears open without effusion. Pink inferior turbinates.  Moist mucous membranes, pharynx without inflammation nor lesions.  Neck supple without palpable lymphadenopathy nor abnormal masses. Lungs: Comfortable work of breathing with mild end expiratory wheezing in the upper lung fields bilaterally without rales or rhonchi. No signs of consolidation Neuro: Cranial nerves II through XII grossly intact Cardiac: Regular rate and rhythm. Normal S1/S2.  No murmurs, rubs, nor gallops.   Extremities: No peripheral edema.  Strong peripheral pulses.  Mental Status: No depression, anxiety, nor agitation. Skin: Warm and dry.  Assessment & Plan: [name] was seen today for dizziness.  Diagnoses and associated orders for this visit:  SOB (shortness of breath) - DG Chest 2 View; Future - CBC  Dizziness - COMPLETE METABOLIC PANEL WITH GFR  Vision loss    Shortness of breath: High suspicion that her asthma is under control right now encourage her to restart taking DUlera pending a chest x-ray and hemoglobin to rule out  Disease and anemia respectively Dizziness: Checking metabolic panel Vision loss: Suspect this is due to her worsening astigmatism and she is due for a new glasses prescription however we will rule out any blood sugar component to this      Return if symptoms worsen or fail to improve.

## 2013-12-25 ENCOUNTER — Encounter (HOSPITAL_BASED_OUTPATIENT_CLINIC_OR_DEPARTMENT_OTHER): Payer: Self-pay | Admitting: *Deleted

## 2013-12-25 ENCOUNTER — Encounter (HOSPITAL_BASED_OUTPATIENT_CLINIC_OR_DEPARTMENT_OTHER): Payer: BC Managed Care – PPO | Admitting: Anesthesiology

## 2013-12-25 ENCOUNTER — Ambulatory Visit (HOSPITAL_BASED_OUTPATIENT_CLINIC_OR_DEPARTMENT_OTHER)
Admission: RE | Admit: 2013-12-25 | Discharge: 2013-12-25 | Disposition: A | Discharge status: Home / Self Care | Payer: BC Managed Care – PPO | Source: Ambulatory Visit | Attending: Orthopedic Surgery | Admitting: Orthopedic Surgery

## 2013-12-25 ENCOUNTER — Ambulatory Visit (HOSPITAL_BASED_OUTPATIENT_CLINIC_OR_DEPARTMENT_OTHER): Payer: BC Managed Care – PPO | Admitting: Anesthesiology

## 2013-12-25 ENCOUNTER — Encounter (HOSPITAL_BASED_OUTPATIENT_CLINIC_OR_DEPARTMENT_OTHER)
Admission: RE | Disposition: A | Discharge status: Home / Self Care | Payer: Self-pay | Source: Ambulatory Visit | Attending: Orthopedic Surgery

## 2013-12-25 DIAGNOSIS — K219 Gastro-esophageal reflux disease without esophagitis: Secondary | ICD-10-CM | POA: Insufficient documentation

## 2013-12-25 DIAGNOSIS — E785 Hyperlipidemia, unspecified: Secondary | ICD-10-CM | POA: Insufficient documentation

## 2013-12-25 DIAGNOSIS — G4733 Obstructive sleep apnea (adult) (pediatric): Secondary | ICD-10-CM | POA: Insufficient documentation

## 2013-12-25 DIAGNOSIS — E669 Obesity, unspecified: Secondary | ICD-10-CM | POA: Insufficient documentation

## 2013-12-25 DIAGNOSIS — J45909 Unspecified asthma, uncomplicated: Secondary | ICD-10-CM | POA: Insufficient documentation

## 2013-12-25 DIAGNOSIS — M1710 Unilateral primary osteoarthritis, unspecified knee: Secondary | ICD-10-CM | POA: Insufficient documentation

## 2013-12-25 DIAGNOSIS — R002 Palpitations: Secondary | ICD-10-CM | POA: Insufficient documentation

## 2013-12-25 DIAGNOSIS — M6530 Trigger finger, unspecified finger: Secondary | ICD-10-CM | POA: Insufficient documentation

## 2013-12-25 DIAGNOSIS — R42 Dizziness and giddiness: Secondary | ICD-10-CM | POA: Insufficient documentation

## 2013-12-25 DIAGNOSIS — I10 Essential (primary) hypertension: Secondary | ICD-10-CM | POA: Insufficient documentation

## 2013-12-25 HISTORY — DX: Presence of spectacles and contact lenses: Z97.3

## 2013-12-25 HISTORY — DX: Presence of dental prosthetic device (complete) (partial): Z97.2

## 2013-12-25 HISTORY — PX: TRIGGER FINGER RELEASE: SHX641

## 2013-12-25 SURGERY — RELEASE, A1 PULLEY, FOR TRIGGER FINGER
Anesthesia: General | Site: Finger | Laterality: Right

## 2013-12-25 MED ORDER — DEXAMETHASONE SODIUM PHOSPHATE 10 MG/ML IJ SOLN
INTRAMUSCULAR | Status: DC | PRN
  Administered 2013-12-25: 10 mg via INTRAVENOUS

## 2013-12-25 MED ORDER — LACTATED RINGERS IV SOLN
INTRAVENOUS | Status: DC
  Administered 2013-12-25 (×3): via INTRAVENOUS

## 2013-12-25 MED ORDER — CEFAZOLIN SODIUM-DEXTROSE 2-3 GM-% IV SOLR
INTRAVENOUS | Status: DC | PRN
  Administered 2013-12-25: 3 g via INTRAVENOUS

## 2013-12-25 MED ORDER — DEXTROSE 5 % IV SOLN: 3.0000 g | INTRAVENOUS | Status: DC

## 2013-12-25 MED ORDER — FENTANYL CITRATE 0.05 MG/ML IJ SOLN
INTRAMUSCULAR | Status: AC
  Filled 2013-12-25: qty 4

## 2013-12-25 MED ORDER — FENTANYL CITRATE 0.05 MG/ML IJ SOLN: 50.0000 ug | INTRAMUSCULAR | Status: DC | PRN

## 2013-12-25 MED ORDER — FENTANYL CITRATE 0.05 MG/ML IJ SOLN
INTRAMUSCULAR | Status: AC
  Filled 2013-12-25: qty 2

## 2013-12-25 MED ORDER — ONDANSETRON HCL 4 MG/2ML IJ SOLN
INTRAMUSCULAR | Status: DC | PRN
  Administered 2013-12-25: 4 mg via INTRAVENOUS

## 2013-12-25 MED ORDER — BUPIVACAINE HCL (PF) 0.25 % IJ SOLN
INTRAMUSCULAR | Status: DC | PRN
  Administered 2013-12-25: 5 mL

## 2013-12-25 MED ORDER — CHLORHEXIDINE GLUCONATE 4 % EX LIQD: 60.0000 mL | Freq: Once | CUTANEOUS | Status: DC

## 2013-12-25 MED ORDER — CEFAZOLIN SODIUM 1-5 GM-% IV SOLN
INTRAVENOUS | Status: AC
  Filled 2013-12-25: qty 50

## 2013-12-25 MED ORDER — FENTANYL CITRATE 0.05 MG/ML IJ SOLN
INTRAMUSCULAR | Status: DC | PRN
  Administered 2013-12-25: 75 ug via INTRAVENOUS
  Administered 2013-12-25: 25 ug via INTRAVENOUS
  Administered 2013-12-25: 50 ug via INTRAVENOUS

## 2013-12-25 MED ORDER — FENTANYL CITRATE 0.05 MG/ML IJ SOLN
25.0000 ug | INTRAMUSCULAR | Status: DC | PRN
  Administered 2013-12-25 (×2): 25 ug via INTRAVENOUS

## 2013-12-25 MED ORDER — MIDAZOLAM HCL 2 MG/2ML IJ SOLN: 1.0000 mg | INTRAMUSCULAR | Status: DC | PRN

## 2013-12-25 MED ORDER — LIDOCAINE HCL (CARDIAC) 20 MG/ML IV SOLN
INTRAVENOUS | Status: DC | PRN
  Administered 2013-12-25: 40 mg via INTRAVENOUS

## 2013-12-25 MED ORDER — PROPOFOL 10 MG/ML IV BOLUS
INTRAVENOUS | Status: DC | PRN
  Administered 2013-12-25: 200 mg via INTRAVENOUS

## 2013-12-25 MED ORDER — ALBUTEROL SULFATE HFA 108 (90 BASE) MCG/ACT IN AERS
2.0000 | INHALATION_SPRAY | Freq: Once | RESPIRATORY_TRACT | Status: AC | PRN
  Administered 2013-12-25: 2 via RESPIRATORY_TRACT

## 2013-12-25 MED ORDER — MIDAZOLAM HCL 5 MG/5ML IJ SOLN
INTRAMUSCULAR | Status: DC | PRN
  Administered 2013-12-25: 2 mg via INTRAVENOUS

## 2013-12-25 MED ORDER — MIDAZOLAM HCL 2 MG/2ML IJ SOLN
INTRAMUSCULAR | Status: AC
  Filled 2013-12-25: qty 2

## 2013-12-25 MED ORDER — CEFAZOLIN SODIUM-DEXTROSE 2-3 GM-% IV SOLR
INTRAVENOUS | Status: AC
  Filled 2013-12-25: qty 50

## 2013-12-25 MED ORDER — ALBUTEROL SULFATE HFA 108 (90 BASE) MCG/ACT IN AERS
INHALATION_SPRAY | RESPIRATORY_TRACT | Status: AC
  Filled 2013-12-25: qty 6.7

## 2013-12-25 MED ORDER — HYDROCODONE-IBUPROFEN 5-200 MG PO TABS: ORAL_TABLET | ORAL | Status: DC

## 2013-12-25 SURGICAL SUPPLY — 37 items
BANDAGE COBAN STERILE 2 (GAUZE/BANDAGES/DRESSINGS) ×3 IMPLANT
BLADE MINI RND TIP GREEN BEAV (BLADE) IMPLANT
BLADE SURG 15 STRL LF DISP TIS (BLADE) ×2 IMPLANT
BLADE SURG 15 STRL SS (BLADE) ×6
BNDG CMPR 9X4 STRL LF SNTH (GAUZE/BANDAGES/DRESSINGS) ×1
BNDG CONFORM 2 STRL LF (GAUZE/BANDAGES/DRESSINGS) ×3 IMPLANT
BNDG ESMARK 4X9 LF (GAUZE/BANDAGES/DRESSINGS) ×2 IMPLANT
CHLORAPREP W/TINT 26ML (MISCELLANEOUS) ×3 IMPLANT
CORDS BIPOLAR (ELECTRODE) ×3 IMPLANT
COVER MAYO STAND STRL (DRAPES) ×3 IMPLANT
COVER TABLE BACK 60X90 (DRAPES) ×3 IMPLANT
CUFF TOURNIQUET SINGLE 18IN (TOURNIQUET CUFF) ×3 IMPLANT
CUFF TOURNIQUET SINGLE 24IN (TOURNIQUET CUFF) ×2 IMPLANT
DRAPE EXTREMITY T 121X128X90 (DRAPE) ×3 IMPLANT
DRAPE SURG 17X23 STRL (DRAPES) ×3 IMPLANT
GAUZE XEROFORM 1X8 LF (GAUZE/BANDAGES/DRESSINGS) ×3 IMPLANT
GLOVE BIO SURGEON STRL SZ7.5 (GLOVE) ×3 IMPLANT
GLOVE BIOGEL PI IND STRL 8 (GLOVE) ×1 IMPLANT
GLOVE BIOGEL PI INDICATOR 8 (GLOVE) ×2
GLOVE SURG SS PI 7.0 STRL IVOR (GLOVE) ×2 IMPLANT
GLOVE SURG SS PI 7.5 STRL IVOR (GLOVE) ×4 IMPLANT
GOWN STRL REUS W/ TWL LRG LVL3 (GOWN DISPOSABLE) ×1 IMPLANT
GOWN STRL REUS W/TWL LRG LVL3 (GOWN DISPOSABLE) ×3
GOWN STRL REUS W/TWL XL LVL3 (GOWN DISPOSABLE) ×3 IMPLANT
NDL HYPO 25X1 1.5 SAFETY (NEEDLE) IMPLANT
NEEDLE HYPO 25X1 1.5 SAFETY (NEEDLE) ×3 IMPLANT
NS IRRIG 1000ML POUR BTL (IV SOLUTION) ×3 IMPLANT
PACK BASIN DAY SURGERY FS (CUSTOM PROCEDURE TRAY) ×3 IMPLANT
PADDING CAST ABS 4INX4YD NS (CAST SUPPLIES) ×2
PADDING CAST ABS COTTON 4X4 ST (CAST SUPPLIES) ×1 IMPLANT
SPONGE GAUZE 4X4 12PLY (GAUZE/BANDAGES/DRESSINGS) ×3 IMPLANT
STOCKINETTE 4X48 STRL (DRAPES) ×3 IMPLANT
SUT ETHILON 4 0 PS 2 18 (SUTURE) ×3 IMPLANT
SYR BULB 3OZ (MISCELLANEOUS) ×3 IMPLANT
SYR CONTROL 10ML LL (SYRINGE) ×2 IMPLANT
TOWEL OR 17X24 6PK STRL BLUE (TOWEL DISPOSABLE) ×6 IMPLANT
UNDERPAD 30X30 INCONTINENT (UNDERPADS AND DIAPERS) ×3 IMPLANT

## 2013-12-25 NOTE — Op Note (Signed)
[id number] 

## 2013-12-25 NOTE — Brief Op Note (Signed)
12/25/2013  10:51 AM  PATIENT:  Eda Keys  63 y.o. female  PRE-OPERATIVE DIAGNOSIS:  RIGHT LONG FINGER TRIGGER DIGIT  POST-OPERATIVE DIAGNOSIS:  RIGHT LONG FINGER TRIGGER DIGIT  PROCEDURE:  Procedure(s): RIGHT LONG TRIGGER RELEASE  (Right)  SURGEON:  Surgeon(s) and Role:    * Tennis Must, MD - Primary  PHYSICIAN ASSISTANT:   ASSISTANTS: none   ANESTHESIA:   general  EBL:  Total I/O In: 1000 [I.V.:1000] Out: -   BLOOD ADMINISTERED:none  DRAINS: none   LOCAL MEDICATIONS USED:  MARCAINE     SPECIMEN:  No Specimen  DISPOSITION OF SPECIMEN:  N/A  COUNTS:  YES  TOURNIQUET:   Total Tourniquet Time Documented: Upper Arm (laterality) - 14 minutes Total: Upper Arm (laterality) - 14 minutes   DICTATION: .Other Dictation: Dictation Number [id number]  PLAN OF CARE: Discharge to home after PACU  PATIENT DISPOSITION:  PACU - hemodynamically stable.

## 2013-12-25 NOTE — Discharge Instructions (Addendum)

## 2013-12-25 NOTE — Anesthesia Postprocedure Evaluation (Signed)
Anesthesia Post Note  Patient: Alison Richards  Procedure(s) Performed: Procedure(s) (LRB): RIGHT LONG TRIGGER RELEASE  (Right)  Anesthesia type: General  Patient location: PACU  Post pain: Pain level controlled and Adequate analgesia  Post assessment: Post-op Vital signs reviewed, Patient's Cardiovascular Status Stable, Respiratory Function Stable, Patent Airway and Pain level controlled  Last Vitals:  Filed Vitals:   12/25/13 1153  BP:   Pulse: 88  Temp:   Resp: 18    Post vital signs: Reviewed and stable  Level of consciousness: awake, alert  and oriented  Complications: No apparent anesthesia complications

## 2013-12-25 NOTE — Anesthesia Procedure Notes (Signed)
Procedure Name: LMA Insertion Date/Time: 12/25/2013 10:27 AM Performed by: Toula Moos Pre-anesthesia Checklist: Patient identified, Emergency Drugs available, Suction available, Patient being monitored and Timeout performed Patient Re-evaluated:Patient Re-evaluated prior to inductionOxygen Delivery Method: Circle System Utilized Preoxygenation: Pre-oxygenation with 100% oxygen Intubation Type: IV induction Ventilation: Mask ventilation without difficulty LMA: LMA inserted LMA Size: 3.0 Number of attempts: 1 Airway Equipment and Method: bite block Placement Confirmation: positive ETCO2 Tube secured with: Tape Dental Injury: Teeth and Oropharynx as per pre-operative assessment

## 2013-12-25 NOTE — Op Note (Signed)
NAMECHELLA, CHAPDELAINE NO.:  [id number]  MEDICAL RECORD NO.:  [id number]  LOCATION:                                 FACILITY:  PHYSICIAN:  Leanora Cover, MD             DATE OF BIRTH:  DATE OF PROCEDURE:  12/25/2013 DATE OF DISCHARGE:                              OPERATIVE REPORT   PREOPERATIVE DIAGNOSIS:  Right long finger, trigger digit.  POSTOPERATIVE DIAGNOSIS:  Right long finger, trigger digit.  PROCEDURE:  Right long finger trigger release.  SURGEON:  Leanora Cover, MD  ASSISTANTS:  None.  ANESTHESIA:  General.  IV FLUIDS:  Per Anesthesia flow sheet.  ESTIMATED BLOOD LOSS:  Minimal.  COMPLICATIONS:  None.  SPECIMENS:  None.  TOURNIQUET TIME:  Fourteen minutes.  DISPOSITION:  Stable to PACU.  INDICATIONS:  Ms. Edmonds is a 63 year old female who has had a trigger digit of the right long finger.  She has had this injected twice with recurrence.  She was to have it released.  Risks, benefits, and alternatives of surgery were discussed including risk of blood loss, infection, damage to nerves, vessels, tendons, ligaments, bone; failure of surgery; need for additional surgery, complications with wound healing, continued pain, and recurrence of triggering.  She voiced understanding of these risks and elected to proceed.  OPERATIVE COURSE:  After being identified preoperatively by myself, the patient and I agreed upon procedure and site procedure.  Surgical site was marked.  Risks, benefits, and alternatives of surgery were reviewed and she wished to proceed.  Surgical consent had been signed.  She was given IV Ancef as preoperative antibiotic prophylaxis.  She was transferred to the operating room and placed on the operating room table in supine position with the right upper extremity on arm board.  General anesthesia was induced by anesthesiologist.  The right upper extremity was prepped and draped in normal sterile orthopedic fashion.   Surgical pause was performed between surgeons, anesthesia, operating room staff, and all were in agreement as to the patient, procedure, and site of procedure.  Tourniquet at the proximal aspect of the extremity was inflated to 250 mmHg after exsanguination of the limb with an Esmarch bandage.  An incision was made, volarly over the MP joint of the long finger.  This was carried into subcutaneous tissues by spreading technique.  All neurovascular structures were protected throughout the case.  The A1 pulley was identified and incised sharply.  It was incised both proximally and distally.  Care was taken to ensure complete decompression.  The proximal 2 mm of the A2 pulley was split to provide a funnel for the tendons.  The tendons were expressed, there was a wound.  There was adherence between the tendons.  There was also noted to be significant amount of synovial fluid within the sheath.  The tendons were pulled through the wound and there was no triggering from either tendon.  The wound was copiously irrigated with sterile saline. It was closed with 4-0 nylon in a horizontal mattress fashion.  It was injected with 5 mL of 0.25% plain Marcaine to aid in postoperative analgesia.  The wound was dressed with sterile Xeroform, 4x4s,  and wrapped with a Coban dressing lightly.  Tourniquet was deflated at 14 minutes.  Fingertips were pink with brisk capillary refill after deflation of tourniquet.  Operative drapes were broken down.  The patient was awoken from anesthesia safely.  She was transferred back to stretcher and taken to PACU in stable condition.  I will see her back in the office in 1 week for postoperative followup.  I will give her Vicoprofen 5/200 1-2 p.o. q.6 hours p.r.n. pain, dispensed #30.     Leanora Cover, MD     KK/MEDQ  D:  12/25/2013  T:  12/25/2013  Job:  [id number]

## 2013-12-25 NOTE — Transfer of Care (Signed)
Immediate Anesthesia Transfer of Care Note  Patient: Alison Richards  Procedure(s) Performed: Procedure(s): RIGHT LONG TRIGGER RELEASE  (Right)  Patient Location: PACU  Anesthesia Type:General  Level of Consciousness: awake, alert , oriented and patient cooperative  Airway & Oxygen Therapy: Patient Spontanous Breathing and Patient connected to face mask oxygen  Post-op Assessment: Report given to PACU RN and Post -op Vital signs reviewed and stable  Post vital signs: Reviewed and stable  Complications: No apparent anesthesia complications

## 2013-12-25 NOTE — Anesthesia Preprocedure Evaluation (Signed)
Anesthesia Evaluation  Patient identified by MRN, date of birth, ID band Patient awake    Reviewed: Allergy & Precautions, H&P , NPO status , Patient's Chart, lab work & pertinent test results  History of Anesthesia Complications (+) PONV and history of anesthetic complications  Airway Mallampati: II TM Distance: >3 FB Neck ROM: Full    Dental no notable dental hx. (+) Upper Dentures, Dental Advisory Given, Edentulous Lower   Pulmonary asthma , sleep apnea and Continuous Positive Airway Pressure Ventilation ,  breath sounds clear to auscultation  Pulmonary exam normal       Cardiovascular hypertension, On Medications Rhythm:Regular Rate:Normal     Neuro/Psych negative neurological ROS  negative psych ROS   GI/Hepatic Neg liver ROS, GERD-  Medicated and Controlled,  Endo/Other  Morbid obesity  Renal/GU negative Renal ROS  negative genitourinary   Musculoskeletal   Abdominal   Peds  Hematology negative hematology ROS (+)   Anesthesia Other Findings   Reproductive/Obstetrics negative OB ROS                           Anesthesia Physical Anesthesia Plan  ASA: III  Anesthesia Plan: General   Post-op Pain Management:    Induction: Intravenous  Airway Management Planned: LMA  Additional Equipment:   Intra-op Plan:   Post-operative Plan: Extubation in OR  Informed Consent: I have reviewed the patients History and Physical, chart, labs and discussed the procedure including the risks, benefits and alternatives for the proposed anesthesia with the patient or authorized representative who has indicated his/her understanding and acceptance.   Dental advisory given  Plan Discussed with: CRNA  Anesthesia Plan Comments:         Anesthesia Quick Evaluation

## 2013-12-25 NOTE — H&P (Signed)
Alison Richards is an 63 y.o. female.   Chief Complaint: right long finger trigger digit HPI: 63 yo rhd female with right long finger trigger digit that has recurred after injections.  She wishes to have it surgically released.    Past Medical History  Diagnosis Date  . Palpitations   . Orthostatic lightheadedness   . Pain in joint   . Hypertension   . Asthma     PAST HX OF ASTHMA WHILE TAKING LISINOPRIL--NO PROBLEMS SINCE DISCONTINUING LISINOPRIL  . Shortness of breath     WITH EXERTION-PT RELATES TO HER WEIGHT  . Obstructive sleep apnea     CPAP EVERY NIGHT-SETTING IS 8  . Blood transfusion     AGE 14 -HEMORRHAGED AFTER TONSILLECTOMY  . Protein in urine   . GERD (gastroesophageal reflux disease)     PAST HX  H-PYLORI   . H/O hiatal hernia   . Arthritis     PAIN AND OA RIGHT KNEE  . PONV (postoperative nausea and vomiting)   . Obesity   . H. pylori infection     Hx of   . Hyperlipemia   . Kidney stones   . Wears dentures     full top  . Wears glasses     Past Surgical History  Procedure Laterality Date  . Bladder surgery    . Tubal ligation  1979  . Cesarean section  1979  . Abdominal hysterectomy  1999  . Neck sx for ruptured disc c4-5  2001  . Tonsillectomy  age 60  . Cholecystectomy  3-08    Dr Lennie Hummer  . Left knee replacement  2008  . Total knee arthroplasty  11/14/2011    Procedure: TOTAL KNEE ARTHROPLASTY;  Surgeon: Mauri Pole, MD;  Location: [facility];  Service: Orthopedics;  Laterality: Right;  combined with general block  . Hand surgery      Bilateral   . Esophageal manometry  09/02/2012    Procedure: ESOPHAGEAL MANOMETRY (EM);  Surgeon: Sable Feil, MD;  Location: [facility];  Service: Endoscopy;  Laterality: N/A;  . Colonoscopy      Family History  Problem Relation Age of Onset  . Alzheimer's disease Mother   . Hypertension Mother   . Hyperlipidemia Mother   . Alcohol abuse Father   . Cancer Father   . COPD Father   . Colon  cancer Neg Hx   . Lung cancer Father   . Diverticulitis Mother   . Skin cancer Father   . Prostate cancer Father    Social History:  reports that she has never smoked. She has never used smokeless tobacco. She reports that she does not drink alcohol or use illicit drugs.  Allergies:  Allergies  Allergen Reactions  . Dilaudid [Hydromorphone Hcl] Swelling    Throat closing up  . Hydromorphone Shortness Of Breath    Pt had a tightening in her throat  . Acetaminophen     REACTION: hallucinations, H/A AND SEVERE SWELLING  . Erythromycin Ethylsuccinate     REACTION: gi symptoms  . Lisinopril     ASTHMA LIKE SYMPTOMS  . Losartan Potassium-Hctz     REACTION: JOINTS aching, hurting all over  . Morphine Sulfate     REACTION: projectile vomiting  . Sulfonamide Derivatives     REACTION: Rash, swelling  . Valsartan     REACTION: Hurting all over  . Latex Rash    WHEN PT WORE LATEX GLOVES-SHE HAD RASH.  SENSITIVE TO  SOME ADHESIVES.    Medications Prior to Admission  Medication Sig Dispense Refill  . Aliskiren-Hydrochlorothiazide (TEKTURNA HCT) 300-12.5 MG TABS Take 1 tablet by mouth daily before breakfast.       . AMBULATORY NON FORMULARY MEDICATION CPAP Machine Uses at bedtime as directed      . hydrochlorothiazide (HYDRODIURIL) 25 MG tablet Take 12.5 mg by mouth daily.      . mometasone-formoterol (DULERA) 100-5 MCG/ACT AERO Inhale 2 puffs into the lungs 2 (two) times daily.  13 g  2  . albuterol (PROVENTIL HFA;VENTOLIN HFA) 108 (90 BASE) MCG/ACT inhaler Inhale 2 puffs into the lungs every 6 (six) hours as needed for wheezing.  1 Inhaler  3  . beclomethasone (QVAR) 80 MCG/ACT inhaler Inhale 1 puff into the lungs as needed.  1 Inhaler  1  . polyethylene glycol powder (GLYCOLAX/MIRALAX) powder Take by mouth as needed.         Results for orders placed in visit on 12/23/13 (from the past 48 hour(s))  COMPLETE METABOLIC PANEL WITH GFR     Status: Abnormal   Collection Time    12/23/13   2:10 PM      Result Value Ref Range   Sodium 141  135 - 145 mEq/L   Potassium 3.9  3.5 - 5.3 mEq/L   Chloride 103  96 - 112 mEq/L   CO2 27  19 - 32 mEq/L   Glucose, Bld 100 (*) 70 - 99 mg/dL   BUN 15  6 - 23 mg/dL   Creat 0.75  0.50 - 1.10 mg/dL   Total Bilirubin 0.4  0.2 - 1.2 mg/dL   Comment: ** Please note change in reference range(s). **   Alkaline Phosphatase 61  39 - 117 U/L   AST 25  0 - 37 U/L   ALT 23  0 - 35 U/L   Total Protein 7.7  6.0 - 8.3 g/dL   Albumin 4.4  3.5 - 5.2 g/dL   Calcium 9.4  8.4 - 10.5 mg/dL   GFR, Est African American >89     GFR, Est Non African American 86     Comment:       The estimated GFR is a calculation valid for adults (>=69 years old)     that uses the CKD-EPI algorithm to adjust for age and sex. It is       not to be used for children, pregnant women, hospitalized patients,        patients on dialysis, or with rapidly changing kidney function.     According to the NKDEP, eGFR >89 is normal, 60-89 shows mild     impairment, 30-59 shows moderate impairment, 15-29 shows severe     impairment and <15 is ESRD.        CBC     Status: None   Collection Time    12/23/13  2:10 PM      Result Value Ref Range   WBC 9.8  4.0 - 10.5 K/uL   RBC 4.65  3.87 - 5.11 MIL/uL   Hemoglobin 13.8  12.0 - 15.0 g/dL   HCT 40.3  36.0 - 46.0 %   MCV 86.7  78.0 - 100.0 fL   MCH 29.7  26.0 - 34.0 pg   MCHC 34.2  30.0 - 36.0 g/dL   RDW 13.8  11.5 - 15.5 %   Platelets 295  150 - 400 K/uL    Dg Chest 2 View  12/23/2013   CLINICAL  DATA:  Short of breath  EXAM: CHEST  2 VIEW  COMPARISON:  DG CHEST 2 VIEW dated 05/26/2012  FINDINGS: Normal heart size. Clear lungs. Bronchitic changes. Subsegmental atelectasis at the left base. No pneumothorax or pleural effusion.  IMPRESSION: No active cardiopulmonary disease.  Chronic changes are noted.   Electronically Signed   By: Maryclare Bean M.D.   On: 12/23/2013 14:53     A comprehensive review of systems was negative except for:  Eyes: positive for contacts/glasses Respiratory: positive for asthma Behavioral/Psych: positive for sleep disturbance  Blood pressure 142/83, pulse 90, temperature 97.7 F (36.5 C), temperature source Oral, resp. rate 20, height _0  (1.651 m), weight 295 lb 4 oz (133.925 kg), SpO2 96.00%.  General appearance: alert, cooperative and appears stated age Head: Normocephalic, without obvious abnormality, atraumatic Neck: supple, symmetrical, trachea midline Resp: clear to auscultation bilaterally Cardio: regular rate and rhythm GI: non tender Extremities: intact sensation and capillary refill all digits.  +epl/fpl/io.  ttp volar mp right long finger.  no wounds Pulses: 2+ and symmetric Skin: Skin color, texture, turgor normal. No rashes or lesions Neurologic: Grossly normal Incision/Wound: none  Assessment/Plan Right long finger trigger digit.  Non operative and operative treatment options were discussed with the patient and patient wishes to proceed with operative treatment. Risks, benefits, and alternatives of surgery were discussed and the patient agrees with the plan of care.   Kadarius Cuffe R 12/25/2013, 8:32 AM

## 2013-12-26 ENCOUNTER — Encounter (HOSPITAL_BASED_OUTPATIENT_CLINIC_OR_DEPARTMENT_OTHER): Payer: Self-pay | Admitting: Orthopedic Surgery

## 2013-12-26 LAB — POCT HEMOGLOBIN-HEMACUE: HEMOGLOBIN: 14.5 g/dL (ref 12.0–15.0)

## 2014-01-19 ENCOUNTER — Encounter: Payer: Self-pay | Admitting: Physician Assistant

## 2014-01-19 ENCOUNTER — Ambulatory Visit (INDEPENDENT_AMBULATORY_CARE_PROVIDER_SITE_OTHER): Payer: BC Managed Care – PPO | Admitting: Physician Assistant

## 2014-01-19 VITALS — BP 147/75 | HR 93 | Ht 64.0 in | Wt 297.0 lb

## 2014-01-19 DIAGNOSIS — R109 Unspecified abdominal pain: Secondary | ICD-10-CM

## 2014-01-19 DIAGNOSIS — J45901 Unspecified asthma with (acute) exacerbation: Secondary | ICD-10-CM

## 2014-01-19 MED ORDER — SUCRALFATE 1 G PO TABS: 1.0000 g | ORAL_TABLET | Freq: Four times a day (QID) | ORAL | Status: DC

## 2014-01-19 MED ORDER — OMEPRAZOLE 40 MG PO CPDR: 40.0000 mg | DELAYED_RELEASE_CAPSULE | Freq: Every day | ORAL | Status: DC

## 2014-01-19 MED ORDER — PREDNISONE 50 MG PO TABS: ORAL_TABLET | ORAL | Status: DC

## 2014-01-19 MED ORDER — MONTELUKAST SODIUM 10 MG PO TABS: 10.0000 mg | ORAL_TABLET | Freq: Every day | ORAL | Status: DC

## 2014-01-19 MED ORDER — IPRATROPIUM-ALBUTEROL 0.5-2.5 (3) MG/3ML IN SOLN
3.0000 mL | Freq: Once | RESPIRATORY_TRACT | Status: AC
  Administered 2014-01-19: 3 mL via RESPIRATORY_TRACT

## 2014-01-19 NOTE — Patient Instructions (Addendum)
Start omeprazole 40mg  daily.  carafate up to four times a day.   Continue dulera. Albuterol inhaler twice a day 2-6 hours. Start singulair. Prednisone for 5 days.

## 2014-01-19 NOTE — Progress Notes (Signed)
Subjective:    Patient ID: Alison Richards, female    DOB: 1951/01/12, 63 y.o.   MRN: [id number]  HPI Patient presents to the clinic with over week of chest tightness, runny nose, ear congestion and wheezing. She sneezed a couple times as well. This morning she noticed that she had more green mucus coming from her nose. She denies any productive cough but has had ongoing dry cough. She has had a bloody nose as well. She is not using a nasal steroid. She denies any fever, chills, nausea, vomiting. She has not been using her Dulera. She started that today and already. Benefit. She has also not been using her over-the-counter antihistamine. Today her chest is felt more and more tight.  Patient has also been having some upper abdominal pain for last week. She feels like there is a knot in her right upper quadrant. She has already had her gallbladder removed. She does have a history of constipation on and off but has been using MiraLax. She denies any stool changes. She denies any bloody or tarry stools. She does admit the pain is worse when eating and sometimes after eating. She has done nothing to help make it better. She does have a history of an H. pylori infection which symptoms were much more severe but similar in nature. Patient denies any nausea or vomiting.    Review of Systems     Objective:   Physical Exam  Constitutional: She is oriented to person, place, and time. She appears well-developed and well-nourished.  Obesity.   HENT:  Head: Normocephalic and atraumatic.  Right Ear: External ear normal.  Left Ear: External ear normal.  Nose: Nose normal.  Mouth/Throat: Oropharynx is clear and moist.  TMs clear bilaterally.  Eyes: Conjunctivae are normal.  Neck: Normal range of motion. Neck supple.  Cardiovascular: Normal rate, regular rhythm and normal heart sounds.   Pulmonary/Chest:  Breath sounds were diminished before doing duoneb. After duoneb air movement was heard more as  well as more wheezing throughout bilateral lungs.  Abdominal: Bowel sounds are normal. She exhibits distension. She exhibits no mass. There is tenderness. There is no rebound and no guarding.  Increased adipose tissue effort abdomen made examination difficult. There seemed to be some mild tenderness over the right and left upper quadrant of abdomen. Abdomen did feel slightly distended and tight.  Neurological: She is alert and oriented to person, place, and time.  Skin: Skin is dry.  Flushed cheeks.  Psychiatric: She has a normal mood and affect. Her behavior is normal.          Assessment & Plan:  Asthma with acute exacerbation-DuoNeb was given in office today. Patient did express benefit after nebulizer. Solu-Medrol 125 mg was given IM today. I did send patient home with 5 days of oral prednisone. Instructed patient to use albuterol inhaler at least twice a day while recovering and then as needed. If still feeling the urge to use albuterol daily in the next 5-7 days can followup. Since has not been using dulera as maintence important to get back on dulera. Since spring season is upon [place] I will send over Singulair for patient to stay on daily with Zyrtec. Patient can also consider nasal spray if needed extra benefit.  Abdominal pain- symptoms do seem consistent with some type of GERD or gastric ulcer; however it could be some muscle spasm due to all of her coughing. She has already had H. pylori and would need the breath  test in order to confirm current infection. We'll start with trying omeprazole 40 mg in the morning as well as Carafate for the next 5 days at no time and before bed. If not improving will need to consider further evaluation. Patient was mainly here for asthma exacerbation and mentioned this in the last 5 minutes. I do not see any red flags for urgent attention.

## 2014-01-20 MED ORDER — METHYLPREDNISOLONE SODIUM SUCC 125 MG IJ SOLR
125.0000 mg | Freq: Once | INTRAMUSCULAR | Status: AC
  Administered 2014-01-20: 125 mg via INTRAMUSCULAR

## 2014-01-20 NOTE — Addendum Note (Signed)
Addended by: Donella Stade on: 01/20/2014 11:38 AM   Modules accepted: Level of Service

## 2014-02-12 ENCOUNTER — Encounter: Payer: Self-pay | Admitting: Family Medicine

## 2014-02-12 ENCOUNTER — Telehealth: Payer: Self-pay | Admitting: *Deleted

## 2014-02-12 ENCOUNTER — Ambulatory Visit (INDEPENDENT_AMBULATORY_CARE_PROVIDER_SITE_OTHER): Payer: BC Managed Care – PPO | Admitting: Family Medicine

## 2014-02-12 VITALS — BP 135/74 | HR 99 | Ht 64.0 in | Wt 300.0 lb

## 2014-02-12 DIAGNOSIS — R413 Other amnesia: Secondary | ICD-10-CM

## 2014-02-12 MED ORDER — MONTELUKAST SODIUM 10 MG PO TABS: 10.0000 mg | ORAL_TABLET | Freq: Every day | ORAL | Status: DC

## 2014-02-12 MED ORDER — ALBUTEROL SULFATE HFA 108 (90 BASE) MCG/ACT IN AERS: 2.0000 | INHALATION_SPRAY | Freq: Four times a day (QID) | RESPIRATORY_TRACT | Status: DC | PRN

## 2014-02-12 NOTE — Telephone Encounter (Signed)
PA obtained for MRi brain w/o.  auth # [id number]. Exp. 03/13/14.  Oscar La, LPN

## 2014-02-12 NOTE — Progress Notes (Signed)
Subjective:    Patient ID: Alison Richards, female    DOB: [birth year], 63 y.o.   MRN: [id number]  HPI  About a year ago felt more forgetful. IN the last 6 months has been worse. Feels like forgetting she has already run a lab test or call a patient and forgot she had already called.  They have gone through a lot of transition at work with going to EMR.  Feels like not able to [facility] as well.  Says suddenly couldn't remember her phone number.  Hx of mjor recurrent depression. Has been writing things down more to help her.  Mom had alzheimers in her early 73s.  Has been more stressed but not sad or tearful.  Uses her CPAP faithfully. Last download was last summer.  Everything looked good.  Will noticed will cometime write the wrong word.  Feelslike her long term memory is not impracted.   Review of Systems  BP 135/74  Pulse 99  Ht 5\' 4"  (1.626 m)  Wt 300 lb (136.079 kg)  BMI 51.47 kg/m2    Allergies  Allergen Reactions  . Dilaudid [Hydromorphone Hcl] Swelling    Throat closing up  . Hydromorphone Shortness Of Breath    Pt had a tightening in her throat  . Acetaminophen     REACTION: hallucinations, H/A AND SEVERE SWELLING  . Erythromycin Ethylsuccinate     REACTION: gi symptoms  . Lisinopril     ASTHMA LIKE SYMPTOMS  . Losartan Potassium-Hctz     REACTION: JOINTS aching, hurting all over  . Morphine Sulfate     REACTION: projectile vomiting  . Sulfonamide Derivatives     REACTION: Rash, swelling  . Valsartan     REACTION: Hurting all over  . Latex Rash    WHEN PT WORE LATEX GLOVES-SHE HAD RASH.  SENSITIVE TO SOME ADHESIVES.    Past Medical History  Diagnosis Date  . Palpitations   . Orthostatic lightheadedness   . Pain in joint   . Hypertension   . Asthma     PAST HX OF ASTHMA WHILE TAKING LISINOPRIL--NO PROBLEMS SINCE DISCONTINUING LISINOPRIL  . Shortness of breath     WITH EXERTION-PT RELATES TO HER WEIGHT  . Obstructive sleep apnea     CPAP EVERY  NIGHT-SETTING IS 8  . Blood transfusion     AGE 43 -HEMORRHAGED AFTER TONSILLECTOMY  . Protein in urine   . GERD (gastroesophageal reflux disease)     PAST HX  H-PYLORI   . H/O hiatal hernia   . Arthritis     PAIN AND OA RIGHT KNEE  . PONV (postoperative nausea and vomiting)   . Obesity   . H. pylori infection     Hx of   . Hyperlipemia   . Kidney stones   . Wears dentures     full top  . Wears glasses     Past Surgical History  Procedure Laterality Date  . Bladder surgery    . Tubal ligation  1979  . Cesarean section  1979  . Abdominal hysterectomy  1999  . Neck sx for ruptured disc c4-5  2001  . Tonsillectomy  age 81  . Cholecystectomy  3-08    Dr Lennie Hummer  . Left knee replacement  2008  . Total knee arthroplasty  11/14/2011    Procedure: TOTAL KNEE ARTHROPLASTY;  Surgeon: Mauri Pole, MD;  Location: [facility];  Service: Orthopedics;  Laterality: Right;  combined with general block  .  Hand surgery      Bilateral   . Esophageal manometry  09/02/2012    Procedure: ESOPHAGEAL MANOMETRY (EM);  Surgeon: Sable Feil, MD;  Location: [facility];  Service: Endoscopy;  Laterality: N/A;  . Colonoscopy    . Trigger finger release Right 12/25/2013    Procedure: RIGHT LONG TRIGGER RELEASE ;  Surgeon: Tennis Must, MD;  Location: [place];  Service: Orthopedics;  Laterality: Right;    History   Social History  . Marital Status: Married    Spouse Name: Herbie Baltimore    Number of Children: 3  . Years of Education: N/A   Occupational History  . LAB [employer]    Social History Main Topics  . Smoking status: Never Smoker   . Smokeless tobacco: Never Used  . Alcohol Use: No  . Drug Use: No  . Sexual Activity: Yes    Partners: Male   Other Topics Concern  . Not on file   Social History Narrative   Daily caffeine     Family History  Problem Relation Age of Onset  . Alzheimer's disease Mother 76  . Hypertension Mother   . Hyperlipidemia Mother   . Alcohol  abuse Father   . Cancer Father   . COPD Father   . Colon cancer Neg Hx   . Lung cancer Father   . Diverticulitis Mother   . Skin cancer Father   . Prostate cancer Father     Outpatient Encounter Prescriptions as of 02/12/2014  Medication Sig  . Aliskiren-Hydrochlorothiazide (TEKTURNA HCT) 300-12.5 MG TABS Take 1 tablet by mouth daily before breakfast.   . AMBULATORY NON FORMULARY MEDICATION CPAP Machine Uses at bedtime as directed  . hydrochlorothiazide (HYDRODIURIL) 25 MG tablet Take 12.5 mg by mouth daily.  . hydrocodone-ibuprofen (VICOPROFEN) 5-200 MG per tablet 1-2 tabs po q6 hours prn pain  . hydrocortisone 2.5 % cream   . mometasone-formoterol (DULERA) 100-5 MCG/ACT AERO Inhale 2 puffs into the lungs 2 (two) times daily.  . montelukast (SINGULAIR) 10 MG tablet Take 1 tablet (10 mg total) by mouth at bedtime.  Marland Kitchen omeprazole (PRILOSEC) 40 MG capsule Take 1 capsule (40 mg total) by mouth daily.  . polyethylene glycol powder (GLYCOLAX/MIRALAX) powder Take by mouth as needed.   . sucralfate (CARAFATE) 1 G tablet Take 1 tablet (1 g total) by mouth 4 (four) times daily.  . [DISCONTINUED] montelukast (SINGULAIR) 10 MG tablet Take 1 tablet (10 mg total) by mouth at bedtime.  Marland Kitchen albuterol (PROVENTIL HFA;VENTOLIN HFA) 108 (90 BASE) MCG/ACT inhaler Inhale 2 puffs into the lungs every 6 (six) hours as needed for wheezing.  . [DISCONTINUED] albuterol (PROVENTIL HFA;VENTOLIN HFA) 108 (90 BASE) MCG/ACT inhaler Inhale 2 puffs into the lungs every 6 (six) hours as needed for wheezing.  . [DISCONTINUED] predniSONE (DELTASONE) 50 MG tablet Take one tablet for 5 days.          Objective:   Physical Exam  Constitutional: She is oriented to person, place, and time. She appears well-developed and well-nourished.  HENT:  Head: Normocephalic and atraumatic.  Right Ear: External ear normal.  Left Ear: External ear normal.  Nose: Nose normal.  Mouth/Throat: Oropharynx is clear and moist.  TMs and  canals are clear.   Eyes: Conjunctivae and EOM are normal. Pupils are equal, round, and reactive to light.  Neck: Neck supple. No thyromegaly present.  Cardiovascular: Normal rate, regular rhythm and normal heart sounds.   No carotid bruits   Pulmonary/Chest:  Effort normal and breath sounds normal. She has no wheezes.  Lymphadenopathy:    She has no cervical adenopathy.  Neurological: She is alert and oriented to person, place, and time.  Skin: Skin is warm and dry.  Psychiatric: She has a normal mood and affect.          Assessment & Plan:  Memory change/loss - Discussed potential Causes. Seems like she is not having depression currently. Has been under more stress lately. OSA is well controlled, so this is less likely to be causing her symptoms. We did do a Mini-Mental status exam today. Her score was 29/30 points. A passing score for her is 28/30. She did complete high school. I also had her complete her PHQ 9 today. Her score was 7. She rated 3 points for overt eating. We will certainly keep this under consideration. I would like to do some additional blood work to look for potential causes of memory loss or change including thyroid, anemia, B12 deficiency. We'll also schedule for MRI of the brain to evaluate for any mass, tumor or increased fluid. If we find something on the blood work first I would like to treat this and then hold off on the MRI if needed. I would like to see her back in about 3 months to see if improving or changing. Roughly at that point in time her stress levels at work will have improved greatly.

## 2014-02-13 ENCOUNTER — Telehealth: Payer: Self-pay | Admitting: *Deleted

## 2014-02-13 ENCOUNTER — Encounter: Payer: Self-pay | Admitting: Family Medicine

## 2014-02-13 DIAGNOSIS — R7301 Impaired fasting glucose: Secondary | ICD-10-CM | POA: Insufficient documentation

## 2014-02-13 LAB — LIPID PANEL
CHOLESTEROL: 210 mg/dL — AB (ref 0–200)
HDL: 62 mg/dL (ref >39)
LDL Cholesterol: 114 mg/dL — ABNORMAL HIGH (ref 0–99)
Total CHOL/HDL Ratio: 3.4 Ratio
Triglycerides: 168 mg/dL — ABNORMAL HIGH (ref <150)
VLDL: 34 mg/dL (ref 0–40)

## 2014-02-13 LAB — RPR

## 2014-02-13 LAB — MAGNESIUM: MAGNESIUM: 1.8 mg/dL (ref 1.5–2.5)

## 2014-02-13 LAB — CBC
HCT: 40.2 % (ref 36.0–46.0)
Hemoglobin: 13.7 g/dL (ref 12.0–15.0)
MCH: 29.7 pg (ref 26.0–34.0)
MCHC: 34.1 g/dL (ref 30.0–36.0)
MCV: 87.2 fL (ref 78.0–100.0)
PLATELETS: 330 10*3/uL (ref 150–400)
RBC: 4.61 MIL/uL (ref 3.87–5.11)
RDW: 14.4 % (ref 11.5–15.5)
WBC: 10.6 10*3/uL — AB (ref 4.0–10.5)

## 2014-02-13 LAB — C-REACTIVE PROTEIN: CRP: 1.9 mg/dL — ABNORMAL HIGH (ref <0.60)

## 2014-02-13 LAB — HEMOGLOBIN A1C
Hgb A1c MFr Bld: 5.9 % — ABNORMAL HIGH (ref <5.7)
MEAN PLASMA GLUCOSE: 123 mg/dL — AB (ref <117)

## 2014-02-13 LAB — SEDIMENTATION RATE: SED RATE: 24 mm/h — AB (ref 0–22)

## 2014-02-13 LAB — VITAMIN B12: Vitamin B-12: 422 pg/mL (ref 211–911)

## 2014-02-13 LAB — TSH: TSH: 2.659 u[IU]/mL (ref 0.350–4.500)

## 2014-02-13 NOTE — Telephone Encounter (Signed)
Pt is requesting something for her nerves prior to having MRI done.Alison Richards

## 2014-02-16 MED ORDER — ALPRAZOLAM 0.5 MG PO TABS: ORAL_TABLET | ORAL | Status: DC

## 2014-02-16 NOTE — Telephone Encounter (Signed)
Will fax to pharmacy.

## 2014-03-03 ENCOUNTER — Ambulatory Visit (HOSPITAL_BASED_OUTPATIENT_CLINIC_OR_DEPARTMENT_OTHER)
Admission: RE | Admit: 2014-03-03 | Discharge: 2014-03-03 | Disposition: A | Discharge status: Home / Self Care | Payer: BC Managed Care – PPO | Source: Ambulatory Visit | Attending: Family Medicine | Admitting: Family Medicine

## 2014-03-03 DIAGNOSIS — R413 Other amnesia: Secondary | ICD-10-CM | POA: Insufficient documentation

## 2014-07-18 ENCOUNTER — Emergency Department (INDEPENDENT_AMBULATORY_CARE_PROVIDER_SITE_OTHER)
Admission: EM | Admit: 2014-07-18 | Discharge: 2014-07-18 | Disposition: A | Discharge status: Home / Self Care | Payer: BC Managed Care – PPO | Source: Home / Self Care | Attending: Emergency Medicine | Admitting: Emergency Medicine

## 2014-07-18 DIAGNOSIS — S61209A Unspecified open wound of unspecified finger without damage to nail, initial encounter: Secondary | ICD-10-CM

## 2014-07-18 DIAGNOSIS — S61012A Laceration without foreign body of left thumb without damage to nail, initial encounter: Secondary | ICD-10-CM

## 2014-07-18 DIAGNOSIS — Z23 Encounter for immunization: Secondary | ICD-10-CM

## 2014-07-18 MED ORDER — TETANUS-DIPHTH-ACELL PERTUSSIS 5-2.5-18.5 LF-MCG/0.5 IM SUSP
0.5000 mL | Freq: Once | INTRAMUSCULAR | Status: AC
  Administered 2014-07-18: 0.5 mL via INTRAMUSCULAR

## 2014-07-18 NOTE — ED Notes (Signed)
Laceration to L thumb at first joint.  Closed and does not reopen with movement.  Cut on rusty ruler.  Want tetanus

## 2014-07-18 NOTE — ED Provider Notes (Signed)
CSN: [id number]     Arrival date & time 07/18/14  1313 History   First MD Initiated Contact with Patient 07/18/14 1330     Chief Complaint  Patient presents with  . Laceration   (Consider location/radiation/quality/duration/timing/severity/associated sxs/prior Treatment) HPI Today, sustained superficial laceration to left thumb on a rusty ruler. Last tetanus shot over 10 years ago.--She requests tetanus shot today. She is alert he cleaned the wound thoroughly with soap and water Denies any weakness or numbness or any significant pain left thumb. Past Medical History  Diagnosis Date  . Palpitations   . Orthostatic lightheadedness   . Pain in joint   . Hypertension   . Asthma     PAST HX OF ASTHMA WHILE TAKING LISINOPRIL--NO PROBLEMS SINCE DISCONTINUING LISINOPRIL  . Shortness of breath     WITH EXERTION-PT RELATES TO HER WEIGHT  . Obstructive sleep apnea     CPAP EVERY NIGHT-SETTING IS 8  . Blood transfusion     AGE 67 -HEMORRHAGED AFTER TONSILLECTOMY  . Protein in urine   . GERD (gastroesophageal reflux disease)     PAST HX  H-PYLORI   . H/O hiatal hernia   . Arthritis     PAIN AND OA RIGHT KNEE  . PONV (postoperative nausea and vomiting)   . Obesity   . H. pylori infection     Hx of   . Hyperlipemia   . Kidney stones   . Wears dentures     full top  . Wears glasses    Past Surgical History  Procedure Laterality Date  . Bladder surgery    . Tubal ligation  1979  . Cesarean section  1979  . Abdominal hysterectomy  1999  . Neck sx for ruptured disc c4-5  2001  . Tonsillectomy  age 61  . Cholecystectomy  3-08    Dr Lennie Hummer  . Left knee replacement  2008  . Total knee arthroplasty  11/14/2011    Procedure: TOTAL KNEE ARTHROPLASTY;  Surgeon: Mauri Pole, MD;  Location: [facility];  Service: Orthopedics;  Laterality: Right;  combined with general block  . Hand surgery      Bilateral   . Esophageal manometry  09/02/2012    Procedure: ESOPHAGEAL MANOMETRY (EM);   Surgeon: Sable Feil, MD;  [facility];  Service: Endoscopy;  Laterality: N/A;  . Colonoscopy    . Trigger finger release Right 12/25/2013    Procedure: RIGHT LONG TRIGGER RELEASE ;  Surgeon: Tennis Must, MD;  Location: [place];  Service: Orthopedics;  Laterality: Right;   Family History  Problem Relation Age of Onset  . Alzheimer's disease Mother 33  . Hypertension Mother   . Hyperlipidemia Mother   . Alcohol abuse Father   . Cancer Father   . COPD Father   . Colon cancer Neg Hx   . Lung cancer Father   . Diverticulitis Mother   . Skin cancer Father   . Prostate cancer Father    History  Substance Use Topics  . Smoking status: Never Smoker   . Smokeless tobacco: Never Used  . Alcohol Use: No   OB History   Grav Para Term Preterm Abortions TAB SAB Ect Mult Living                 Review of Systems No fever or chills Allergies  Dilaudid; Hydromorphone; Acetaminophen; Erythromycin ethylsuccinate; Lisinopril; Losartan potassium-hctz; Morphine sulfate; Sulfonamide derivatives; Valsartan; and Latex  Home Medications  Prior to Admission medications   Medication Sig Start Date End Date Taking? Authorizing Provider  albuterol (PROVENTIL HFA;VENTOLIN HFA) 108 (90 BASE) MCG/ACT inhaler Inhale 2 puffs into the lungs every 6 (six) hours as needed for wheezing. 02/12/14 02/12/15  Hali Marry, MD  Aliskiren-Hydrochlorothiazide (TEKTURNA HCT) 300-12.5 MG TABS Take 1 tablet by mouth daily before breakfast.     Historical Provider, MD  ALPRAZolam Duanne Moron) 0.5 MG tablet One tab about 30 min prior to procedure. Can repeat in 1 hour if needed. 02/16/14   Hali Marry, MD  AMBULATORY NON FORMULARY MEDICATION CPAP Machine Uses at bedtime as directed    Historical Provider, MD  hydrochlorothiazide (HYDRODIURIL) 25 MG tablet Take 12.5 mg by mouth daily.    Historical Provider, MD  hydrocodone-ibuprofen (VICOPROFEN) 5-200 MG per tablet 1-2 tabs po  q6 hours prn pain 12/25/13   Leanora Cover, MD  hydrocortisone 2.5 % cream  01/23/14   Historical Provider, MD  mometasone-formoterol (DULERA) 100-5 MCG/ACT AERO Inhale 2 puffs into the lungs 2 (two) times daily. 05/28/12   Hali Marry, MD  montelukast (SINGULAIR) 10 MG tablet Take 1 tablet (10 mg total) by mouth at bedtime. 02/12/14   Hali Marry, MD  omeprazole (PRILOSEC) 40 MG capsule Take 1 capsule (40 mg total) by mouth daily. 01/19/14   Jade L Breeback, PA-C  polyethylene glycol powder (GLYCOLAX/MIRALAX) powder Take by mouth as needed.  11/04/11   Historical Provider, MD  sucralfate (CARAFATE) 1 G tablet Take 1 tablet (1 g total) by mouth 4 (four) times daily. 01/19/14   Jade L Breeback, PA-C   BP 130/77  Pulse 98  Temp(Src) 98.2 F (36.8 C) (Oral)  Ht 5\' 3"  (1.6 m)  Wt 292 lb 12 oz (132.791 kg)  BMI 51.87 kg/m2  SpO2 96% Physical Exam Small superficial laceration left thumb. Does not separate with movement. She declined any other exam. ED Course  Procedures (including critical care time) Labs Review Labs Reviewed - No data to display  Imaging Review No results found.   MDM   1. Laceration of left thumb, initial encounter    DTaP given. Small bandage. Wound care discussed by nurse. Follow-up with your primary care doctor in 4-5 days. Precautions discussed. Red flags discussed.--Emergency room if any red flag Questions invited and answered. Patient voiced understanding and agreement.      Jacqulyn Cane, MD 07/18/14 [id number]

## 2014-08-14 ENCOUNTER — Other Ambulatory Visit: Payer: Self-pay

## 2014-10-01 ENCOUNTER — Encounter: Payer: Self-pay | Admitting: *Deleted

## 2014-10-01 ENCOUNTER — Emergency Department (INDEPENDENT_AMBULATORY_CARE_PROVIDER_SITE_OTHER)
Admission: EM | Admit: 2014-10-01 | Discharge: 2014-10-01 | Disposition: A | Discharge status: Home / Self Care | Payer: BC Managed Care – PPO | Source: Home / Self Care | Attending: Family Medicine | Admitting: Family Medicine

## 2014-10-01 DIAGNOSIS — L719 Rosacea, unspecified: Secondary | ICD-10-CM

## 2014-10-01 DIAGNOSIS — J3489 Other specified disorders of nose and nasal sinuses: Secondary | ICD-10-CM

## 2014-10-01 MED ORDER — DOXYCYCLINE HYCLATE 100 MG PO CAPS: 100.0000 mg | ORAL_CAPSULE | Freq: Two times a day (BID) | ORAL | Status: DC

## 2014-10-01 MED ORDER — METRONIDAZOLE 1 % EX GEL: Freq: Every day | CUTANEOUS | Status: DC

## 2014-10-01 NOTE — ED Notes (Signed)
Pt c/o nasal pain x 3 days and cough and congestion x 1 day. Hx of skin peeling on nose for 1 year.

## 2014-10-01 NOTE — ED Provider Notes (Signed)
Alison Richards is a 63 y.o. female who presents to Urgent Care today for pain. Patient has a three-day history of pain across the bridge of her nose along with some congestion and sneezing. Additionally she's not skin irritation and peeling present for about a year.  It has become worse enough that she has pain with wearing her glasses. No fevers or chills vomiting or diarrhea. No injury.   Past Medical History  Diagnosis Date  . Palpitations   . Orthostatic lightheadedness   . Pain in joint   . Hypertension   . Asthma     PAST HX OF ASTHMA WHILE TAKING LISINOPRIL--NO PROBLEMS SINCE DISCONTINUING LISINOPRIL  . Shortness of breath     WITH EXERTION-PT RELATES TO HER WEIGHT  . Obstructive sleep apnea     CPAP EVERY NIGHT-SETTING IS 8  . Blood transfusion     AGE 36 -HEMORRHAGED AFTER TONSILLECTOMY  . Protein in urine   . GERD (gastroesophageal reflux disease)     PAST HX  H-PYLORI   . H/O hiatal hernia   . Arthritis     PAIN AND OA RIGHT KNEE  . PONV (postoperative nausea and vomiting)   . Obesity   . H. pylori infection     Hx of   . Hyperlipemia   . Kidney stones   . Wears dentures     full top  . Wears glasses    Past Surgical History  Procedure Laterality Date  . Bladder surgery    . Tubal ligation  1979  . Cesarean section  1979  . Abdominal hysterectomy  1999  . Neck sx for ruptured disc c4-5  2001  . Tonsillectomy  age 63  . Cholecystectomy  3-08    Dr Lennie Hummer  . Left knee replacement  2008  . Total knee arthroplasty  11/14/2011    Procedure: TOTAL KNEE ARTHROPLASTY;  Surgeon: Mauri Pole, MD;  Location: [facility];  Service: Orthopedics;  Laterality: Right;  combined with general block  . Hand surgery      Bilateral   . Esophageal manometry  09/02/2012    Procedure: ESOPHAGEAL MANOMETRY (EM);  Surgeon: Sable Feil, MD;  [facility];  Service: Endoscopy;  Laterality: N/A;  . Colonoscopy    . Trigger finger release Right 12/25/2013   Procedure: RIGHT LONG TRIGGER RELEASE ;  Surgeon: Tennis Must, MD;  Location: [place];  Service: Orthopedics;  Laterality: Right;   History  Substance Use Topics  . Smoking status: Never Smoker   . Smokeless tobacco: Never Used  . Alcohol Use: No   ROS as above Medications: No current facility-administered medications for this encounter.   Current Outpatient Prescriptions  Medication Sig Dispense Refill  . albuterol (PROVENTIL HFA;VENTOLIN HFA) 108 (90 BASE) MCG/ACT inhaler Inhale 2 puffs into the lungs every 6 (six) hours as needed for wheezing. 1 Inhaler 3  . Aliskiren-Hydrochlorothiazide (TEKTURNA HCT) 300-12.5 MG TABS Take 1 tablet by mouth daily before breakfast.     . ALPRAZolam (XANAX) 0.5 MG tablet One tab about 30 min prior to procedure. Can repeat in 1 hour if needed. 4 tablet 0  . AMBULATORY NON FORMULARY MEDICATION CPAP Machine Uses at bedtime as directed    . doxycycline (VIBRAMYCIN) 100 MG capsule Take 1 capsule (100 mg total) by mouth 2 (two) times daily. 20 capsule 0  . hydrochlorothiazide (HYDRODIURIL) 25 MG tablet Take 12.5 mg by mouth daily.    . hydrocodone-ibuprofen (VICOPROFEN)  5-200 MG per tablet 1-2 tabs po q6 hours prn pain 30 tablet 0  . hydrocortisone 2.5 % cream     . metroNIDAZOLE (METROGEL) 1 % gel Apply topically daily. 45 g 1  . mometasone-formoterol (DULERA) 100-5 MCG/ACT AERO Inhale 2 puffs into the lungs 2 (two) times daily. 13 g 2  . montelukast (SINGULAIR) 10 MG tablet Take 1 tablet (10 mg total) by mouth at bedtime. 30 tablet 1  . omeprazole (PRILOSEC) 40 MG capsule Take 1 capsule (40 mg total) by mouth daily. 30 capsule 3  . polyethylene glycol powder (GLYCOLAX/MIRALAX) powder Take by mouth as needed.     . sucralfate (CARAFATE) 1 G tablet Take 1 tablet (1 g total) by mouth 4 (four) times daily. 120 tablet 0   Allergies  Allergen Reactions  . Dilaudid [Hydromorphone Hcl] Swelling    Throat closing up  . Hydromorphone  Shortness Of Breath    Pt had a tightening in her throat  . Acetaminophen     REACTION: hallucinations, H/A AND SEVERE SWELLING  . Erythromycin Ethylsuccinate     REACTION: gi symptoms  . Lisinopril     ASTHMA LIKE SYMPTOMS  . Losartan Potassium-Hctz     REACTION: JOINTS aching, hurting all over  . Morphine Sulfate     REACTION: projectile vomiting  . Sulfonamide Derivatives     REACTION: Rash, swelling  . Valsartan     REACTION: Hurting all over  . Latex Rash    WHEN PT WORE LATEX GLOVES-SHE HAD RASH.  SENSITIVE TO SOME ADHESIVES.     Exam:  BP 134/70 mmHg  Pulse 95  Temp(Src) 98.2 F (36.8 C) (Oral)  Resp 16  Wt 273 lb (123.832 kg)  SpO2 96% Gen: Well NAD HEENT: EOMI,  MMM no significant swelling along the nose. Maxillary and frontal sinuses are nontender. Clear nasal discharge with mildly inflamed nasal terminus bilaterally. Otherwise nostrils are normal appearing. Patient is quite tender with palpation across the bridge of the nose. Lungs: Normal work of breathing. CTABL Heart: RRR no MRG Abd: NABS, Soft. Nondistended, Nontender Exts: Brisk capillary refill, warm and well perfused.  Skin: Macular erythema with small papules across the face in a malar pattern  No results found for this or any previous visit (from the past 24 hour(s)). No results found.  Assessment and Plan: 63 y.o. female with pain and rosacea. Concern for sinusitis versus cellulitis. Will treat with doxycycline. Additionally we'll treat rosacea with MetroGel. Patient will follow-up or go to the emergency room she did feel strong improve or worsens.  Discussed warning signs or symptoms. Please see discharge instructions. Patient expresses understanding.     Gregor Hams, MD 10/01/14 [phone]

## 2014-10-01 NOTE — Discharge Instructions (Signed)
Thank you for coming in today. Take doxycycline twice daily Apply metronidazole gel Follow-up with primary care provider Go to the emergency room if you get worse  Rosacea Rosacea is a long-term (chronic) condition that affects the skin of the face (cheeks, nose, brow, and chin) and sometimes the eyes. Rosacea causes the blood vessels near the surface of the skin to enlarge, resulting in redness. This condition usually begins after age 63. It occurs most often in light-skinned women. Without treatment, rosacea tends to get worse over time. There is no cure for rosacea, but treatment can help control your symptoms. CAUSES  The cause is unknown. It is thought that some people may inherit a tendency to develop rosacea. Certain triggers can make your rosacea worse, including:  Hot baths.  Exercise.  Sunlight.  Very hot or cold temperatures.  Hot or spicy foods and drinks.  Drinking alcohol.  Stress.  Taking blood pressure medicine.  Long-term use of topical steroids on the face. SYMPTOMS   Redness of the face.  Red bumps or pimples on the face.  Red, enlarged nose (rhinophyma).  Blushing easily.  Red lines on the skin.  Irritated or burning feeling in the eyes.  Swollen eyelids. DIAGNOSIS  Your caregiver can usually tell what is wrong by asking about your symptoms and performing a physical exam. TREATMENT  Avoiding triggers is an important part of treatment. You will also need to see a skin specialist (dermatologist) who can develop a treatment plan for you. The goals of treatment are to control your condition and to improve the appearance of your skin. It may take several weeks or months of treatment before you notice an improvement in your skin. Even after your skin improves, you will likely need to continue treatment to prevent your rosacea from coming back. Treatment methods may include:  Using sunscreen or sunblock daily to protect the skin.  Antibiotic medicine,  such as metronidazole, applied directly to the skin.  Antibiotics taken by mouth. This is usually prescribed if you have eye problems from your rosacea.  Laser surgery to improve the appearance of the skin. This surgery can reduce the appearance of red lines on the skin and can remove excess tissue from the nose to reduce its size. HOME CARE INSTRUCTIONS  Avoid things that seem to trigger your flare-ups.  If you are given antibiotics, take them as directed. Finish them even if you start to feel better.  Use a gentle facial cleanser that does not contain alcohol.  You may use a mild facial moisturizer.  Use a sunscreen or sunblock with SPF 30 or greater.  Wear a green-tinted foundation powder to conceal redness, if needed. Choose cosmetics that are noncomedogenic. This means they do not block your pores.  If your eyelids are affected, apply warm compresses to the eyelids. Do this up to 4 times a day or as directed by your caregiver. SEEK MEDICAL CARE IF:  Your skin problems get worse.  You feel depressed.  You lose your appetite.  You have trouble concentrating.  You have problems with your eyes, such as redness or itching. MAKE SURE YOU:  Understand these instructions.  Will watch your condition.  Will get help right away if you are not doing well or get worse. Document Released: 11/23/2004 Document Revised: 04/16/2012 Document Reviewed: 09/26/2011 [facility] Patient Information 2015 [place], [place]. This information is not intended to replace advice given to you by your health care provider. Make sure you discuss any questions you have  with your health care provider. ° °

## 2014-11-04 ENCOUNTER — Ambulatory Visit (INDEPENDENT_AMBULATORY_CARE_PROVIDER_SITE_OTHER): Payer: BLUE CROSS/BLUE SHIELD | Admitting: Physician Assistant

## 2014-11-04 ENCOUNTER — Encounter: Payer: Self-pay | Admitting: Physician Assistant

## 2014-11-04 VITALS — BP 118/66 | HR 87 | Ht 63.0 in | Wt 268.0 lb

## 2014-11-04 DIAGNOSIS — M62830 Muscle spasm of back: Secondary | ICD-10-CM

## 2014-11-04 MED ORDER — KETOROLAC TROMETHAMINE 30 MG/ML IJ SOLN
30.0000 mg | Freq: Once | INTRAMUSCULAR | Status: AC
  Administered 2014-11-04: 30 mg via INTRAMUSCULAR

## 2014-11-04 MED ORDER — CYCLOBENZAPRINE HCL 10 MG PO TABS: 10.0000 mg | ORAL_TABLET | Freq: Three times a day (TID) | ORAL | Status: DC | PRN

## 2014-11-04 NOTE — Patient Instructions (Addendum)
Flexeril as needed up to three times a day but consider sedation warning.  voltaren gel as needed.  Heat and ice. epson salt bath.   Muscle Cramps and Spasms Muscle cramps and spasms occur when a muscle or muscles tighten and you have no control over this tightening (involuntary muscle contraction). They are a common problem and can develop in any muscle. The most common place is in the calf muscles of the leg. Both muscle cramps and muscle spasms are involuntary muscle contractions, but they also have differences:   Muscle cramps are sporadic and painful. They may last a few seconds to a quarter of an hour. Muscle cramps are often more forceful and last longer than muscle spasms.  Muscle spasms may or may not be painful. They may also last just a few seconds or much longer. CAUSES  It is uncommon for cramps or spasms to be due to a serious underlying problem. In many cases, the cause of cramps or spasms is unknown. Some common causes are:   Overexertion.   Overuse from repetitive motions (doing the same thing over and over).   Remaining in a certain position for a long period of time.   Improper preparation, form, or technique while performing a sport or activity.   Dehydration.   Injury.   Side effects of some medicines.   Abnormally low levels of the salts and ions in your blood (electrolytes), especially potassium and calcium. This could happen if you are taking water pills (diuretics) or you are pregnant.  Some underlying medical problems can make it more likely to develop cramps or spasms. These include, but are not limited to:   Diabetes.   Parkinson disease.   Hormone disorders, such as thyroid problems.   Alcohol abuse.   Diseases specific to muscles, joints, and bones.   Blood vessel disease where not enough blood is getting to the muscles.  HOME CARE INSTRUCTIONS   Stay well hydrated. Drink enough water and fluids to keep your urine clear or pale  yellow.  It may be helpful to massage, stretch, and relax the affected muscle.  For tight or tense muscles, use a warm towel, heating pad, or hot shower water directed to the affected area.  If you are sore or have pain after a cramp or spasm, applying ice to the affected area may relieve discomfort.  Put ice in a plastic bag.  Place a towel between your skin and the bag.  Leave the ice on for 15-20 minutes, 03-04 times a day.  Medicines used to treat a known cause of cramps or spasms may help reduce their frequency or severity. Only take over-the-counter or prescription medicines as directed by your caregiver. SEEK MEDICAL CARE IF:  Your cramps or spasms get more severe, more frequent, or do not improve over time.  MAKE SURE YOU:   Understand these instructions.  Will watch your condition.  Will get help right away if you are not doing well or get worse. Document Released: 04/07/2002 Document Revised: 02/10/2013 Document Reviewed: 10/02/2012 [facility] Patient Information 2015 [place], [place]. This information is not intended to replace advice given to you by your health care provider. Make sure you discuss any questions you have with your health care provider.

## 2014-11-05 NOTE — Progress Notes (Signed)
   Subjective:    Patient ID: Alison Richards, female    DOB: December 20, 1950, 64 y.o.   MRN: [id number]  HPI Pt presents to the clinic with mid back pain and tightness that started today and happens in episodes. She will be siting, or standing, or walking then all of a sudden her mid back locks up. When her back locks up she is in tremendous pain and cannot move. Begins to ease over time. Not tried anything to make better today. No radiation or pain down back. No back injury. No urinary symptoms. 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    Review of Systems  All other systems reviewed and are negative.      Objective:   Physical Exam  Constitutional: She is oriented to person, place, and time. She appears well-developed and well-nourished.  HENT:  Head: Normocephalic and atraumatic.  Cardiovascular: Normal rate, regular rhythm and normal heart sounds.   Pulmonary/Chest: Effort normal and breath sounds normal.  No CVA tenderness, bilaterally.   Musculoskeletal:  Currently not in spasms.  NROM at waist.  No tenderness over spine to palpation.  paraspinous mid back muscle tightness.   Neurological: She is alert and oriented to person, place, and time.  Psychiatric: She has a normal mood and affect. Her behavior is normal.          Assessment & Plan:  Muscle spasms of back- Toradol 30mg  IM given today. Flexeril at bedtime. Sedation warning given. Consider epson salt bath. Consider massage. Discussed exercises and stretches for mid back. Encouraged voltaren gel as well. If continues may consider formal PT. Can also use Ibuprofen 800mg  up to three times a day. Not to use in combination with voltaren getl.

## 2014-12-18 ENCOUNTER — Emergency Department (INDEPENDENT_AMBULATORY_CARE_PROVIDER_SITE_OTHER)
Admission: EM | Admit: 2014-12-18 | Discharge: 2014-12-18 | Disposition: A | Discharge status: Home / Self Care | Payer: BLUE CROSS/BLUE SHIELD | Source: Home / Self Care | Attending: Family Medicine | Admitting: Family Medicine

## 2014-12-18 ENCOUNTER — Encounter: Payer: Self-pay | Admitting: *Deleted

## 2014-12-18 ENCOUNTER — Emergency Department (INDEPENDENT_AMBULATORY_CARE_PROVIDER_SITE_OTHER): Payer: BLUE CROSS/BLUE SHIELD

## 2014-12-18 DIAGNOSIS — J209 Acute bronchitis, unspecified: Secondary | ICD-10-CM

## 2014-12-18 DIAGNOSIS — J9811 Atelectasis: Secondary | ICD-10-CM

## 2014-12-18 MED ORDER — DOXYCYCLINE HYCLATE 100 MG PO CAPS: 100.0000 mg | ORAL_CAPSULE | Freq: Two times a day (BID) | ORAL | Status: DC

## 2014-12-18 MED ORDER — TRIAMCINOLONE ACETONIDE 40 MG/ML IJ SUSP
40.0000 mg | Freq: Once | INTRAMUSCULAR | Status: AC
  Administered 2014-12-18: 40 mg via INTRAMUSCULAR

## 2014-12-18 MED ORDER — ALBUTEROL SULFATE HFA 108 (90 BASE) MCG/ACT IN AERS: 2.0000 | INHALATION_SPRAY | Freq: Four times a day (QID) | RESPIRATORY_TRACT | Status: DC | PRN

## 2014-12-18 MED ORDER — IPRATROPIUM-ALBUTEROL 0.5-2.5 (3) MG/3ML IN SOLN
3.0000 mL | Freq: Once | RESPIRATORY_TRACT | Status: AC
  Administered 2014-12-18: 3 mL via RESPIRATORY_TRACT

## 2014-12-18 MED ORDER — BENZONATATE 200 MG PO CAPS: 200.0000 mg | ORAL_CAPSULE | Freq: Every day | ORAL | Status: DC

## 2014-12-18 NOTE — ED Provider Notes (Signed)
CSN: [id number]     Arrival date & time 12/18/14  1629 History   First MD Initiated Contact with Patient 12/18/14 1724     Chief Complaint  Patient presents with  . Shortness of Breath      HPI Comments: Three days ago patient developed a "rattle" in her chest, and over the past two days has developed a nonproductive cough with wheezing and shortness of breath with activity.  She uses Dulera, and has had to use her albuterol inhaler several times.  No fevers, chills, and sweats.  No pleuritic pain.  The history is provided by the patient.    Past Medical History  Diagnosis Date  . Palpitations   . Orthostatic lightheadedness   . Pain in joint   . Hypertension   . Asthma     PAST HX OF ASTHMA WHILE TAKING LISINOPRIL--NO PROBLEMS SINCE DISCONTINUING LISINOPRIL  . Shortness of breath     WITH EXERTION-PT RELATES TO HER WEIGHT  . Obstructive sleep apnea     CPAP EVERY NIGHT-SETTING IS 8  . Blood transfusion     AGE 48 -HEMORRHAGED AFTER TONSILLECTOMY  . Protein in urine   . GERD (gastroesophageal reflux disease)     PAST HX  H-PYLORI   . H/O hiatal hernia   . Arthritis     PAIN AND OA RIGHT KNEE  . PONV (postoperative nausea and vomiting)   . Obesity   . H. pylori infection     Hx of   . Hyperlipemia   . Kidney stones   . Wears dentures     full top  . Wears glasses    Past Surgical History  Procedure Laterality Date  . Bladder surgery    . Tubal ligation  1979  . Cesarean section  1979  . Abdominal hysterectomy  1999  . Neck sx for ruptured disc c4-5  2001  . Tonsillectomy  age 66  . Cholecystectomy  3-08    Dr Lennie Hummer  . Left knee replacement  2008  . Total knee arthroplasty  11/14/2011    Procedure: TOTAL KNEE ARTHROPLASTY;  Surgeon: Mauri Pole, MD;  Location: [facility];  Service: Orthopedics;  Laterality: Right;  combined with general block  . Hand surgery      Bilateral   . Esophageal manometry  09/02/2012    Procedure: ESOPHAGEAL MANOMETRY (EM);  Surgeon:  Sable Feil, MD;  Location: [facility];  Service: Endoscopy;  Laterality: N/A;  . Colonoscopy    . Trigger finger release Right 12/25/2013    Procedure: RIGHT LONG TRIGGER RELEASE ;  Surgeon: Tennis Must, MD;  Location: [place];  Service: Orthopedics;  Laterality: Right;   Family History  Problem Relation Age of Onset  . Alzheimer's disease Mother 80  . Hypertension Mother   . Hyperlipidemia Mother   . Alcohol abuse Father   . Cancer Father   . COPD Father   . Colon cancer Neg Hx   . Lung cancer Father   . Diverticulitis Mother   . Skin cancer Father   . Prostate cancer Father    History  Substance Use Topics  . Smoking status: Never Smoker   . Smokeless tobacco: Never Used  . Alcohol Use: No   OB History    No data available     Review of Systems No sore throat + cough No pleuritic pain + wheezing No nasal congestion No post-nasal drainage No sinus pain/pressure No itchy/red eyes  No earache No hemoptysis + SOB No fever, + chills No nausea No vomiting No abdominal pain No diarrhea No urinary symptoms No skin rash + fatigue No myalgias No headache Used OTC meds without relief  Allergies  Dilaudid; Hydromorphone; Acetaminophen; Erythromycin ethylsuccinate; Lisinopril; Losartan potassium-hctz; Morphine sulfate; Sulfonamide derivatives; Valsartan; and Latex  Home Medications   Prior to Admission medications   Medication Sig Start Date End Date Taking? Authorizing Provider  albuterol (PROVENTIL HFA;VENTOLIN HFA) 108 (90 BASE) MCG/ACT inhaler Inhale 2 puffs into the lungs every 6 (six) hours as needed for wheezing. 12/18/14 12/18/15  Kandra Nicolas, MD  Aliskiren-Hydrochlorothiazide (TEKTURNA HCT) 300-12.5 MG TABS Take 1 tablet by mouth daily before breakfast.     Historical Provider, MD  AMBULATORY NON FORMULARY MEDICATION CPAP Machine Uses at bedtime as directed    Historical Provider, MD  benzonatate (TESSALON) 200 MG capsule Take  1 capsule (200 mg total) by mouth at bedtime. Take as needed for cough 12/18/14   Kandra Nicolas, MD  cyclobenzaprine (FLEXERIL) 10 MG tablet Take 1 tablet (10 mg total) by mouth 3 (three) times daily as needed for muscle spasms. 11/04/14   Jade L Breeback, PA-C  doxycycline (VIBRAMYCIN) 100 MG capsule Take 1 capsule (100 mg total) by mouth 2 (two) times daily. Take with food. 12/18/14   Kandra Nicolas, MD  hydrochlorothiazide (HYDRODIURIL) 25 MG tablet Take 12.5 mg by mouth daily.    Historical Provider, MD  hydrocortisone 2.5 % cream  01/23/14   Historical Provider, MD  mometasone-formoterol (DULERA) 100-5 MCG/ACT AERO Inhale 2 puffs into the lungs 2 (two) times daily. 05/28/12   Hali Marry, MD  polyethylene glycol powder [facility]) powder Take by mouth as needed.  11/04/11   Historical Provider, MD   BP 162/77 mmHg  Pulse 115  Temp(Src) 98.4 F (36.9 C) (Oral)  Resp 24  SpO2 94% Physical Exam Nursing notes and Vital Signs reviewed. Appearance:  Patient appears stated age, and in no acute distress Eyes:  Pupils are equal, round, and reactive to light and accomodation.  Extraocular movement is intact.  Conjunctivae are not inflamed  Ears:  Canals normal.  Tympanic membranes normal.  Nose:  Mildly congested turbinates.  No sinus tenderness.   Pharynx:  Normal Neck:  Supple.  Tender shotty posterior nodes are palpated bilaterally  Lungs:   Bilateral expiratory wheezes are present.  Breath sounds are equal.  Heart:  Regular rate and rhythm without murmurs, rubs, or gallops.  Abdomen:  Nontender without masses or hepatosplenomegaly.  Bowel sounds are present.  No CVA or flank tenderness.  Extremities:  No edema.  No calf tenderness Skin:  No rash present.   ED Course  Procedures  none    Imaging Review Dg Chest 2 View  12/18/2014   CLINICAL DATA:  Cough, shortness of breath and wheezing since Wednesday, received a breathing treatment at urgent care, history asthma, benign  systemic hypertension, hiatal hernia, hyperlipidemia  EXAM: CHEST  2 VIEW  COMPARISON:  12/23/2013  FINDINGS: Upper normal heart size.  Normal mediastinal contours and pulmonary vascularity.  Peribronchial thickening and accentuation of perihilar interstitial markings.  Subsegmental atelectasis at minor fissure and at LEFT base.  When accounting for differences in technique between the 2 studies, no definite acute infiltrate,, pleural effusion or pneumothorax.  Scattered endplate spur formation thoracic spine.  Prior cervical spine fusion.  IMPRESSION: Bronchitic changes with bibasilar atelectasis.  Slight interstitial prominence in perihilar regions may be related to differences in  technique and note definite acute infiltrate is identified.   Electronically Signed   By: Lavonia Dana M.D.   On: 12/18/2014 17:04     MDM   1. Bronchospasm with bronchitis, acute    Kenalog 40mg  IM.  Begin doxycycline 100mg  BID for atypical coverage. Prescription written for Benzonatate [facility]) to take at bedtime for night-time cough.  Take plain guaifenesin 1200mg  (Mucinex) twice daily, with plenty of water, for cough and congestion.  Get adequate rest.   May use Afrin nasal spray (or generic oxymetazoline) twice daily for about 5 days.  Also recommend using saline nasal spray several times daily and saline nasal irrigation (AYR is a common brand) Continue inhalers Try warm salt water gargles for sore throat.  Stop all antihistamines for now, and other non-prescription cough/cold preparations.   Follow-up with family doctor if not improving about one week. If symptoms become significantly worse during the night or over the weekend, proceed to the local emergency room.     Kandra Nicolas, MD 12/23/14 [phone]

## 2014-12-18 NOTE — ED Notes (Signed)
Jan c/o cough and congestion x 3 days. H/o asthma. Today became for SOB and wheezing. Used inhaler x 3.

## 2014-12-18 NOTE — Discharge Instructions (Signed)
Take plain guaifenesin 1200mg  (Mucinex) twice daily, with plenty of water, for cough and congestion.  Get adequate rest.   May use Afrin nasal spray (or generic oxymetazoline) twice daily for about 5 days.  Also recommend using saline nasal spray several times daily and saline nasal irrigation (AYR is a common brand) Continue inhalers Try warm salt water gargles for sore throat.  Stop all antihistamines for now, and other non-prescription cough/cold preparations.   Follow-up with family doctor if not improving about one week. If symptoms become significantly worse during the night or over the weekend, proceed to the local emergency room.

## 2014-12-20 ENCOUNTER — Telehealth: Payer: Self-pay | Admitting: Emergency Medicine

## 2014-12-21 ENCOUNTER — Ambulatory Visit (INDEPENDENT_AMBULATORY_CARE_PROVIDER_SITE_OTHER): Payer: BLUE CROSS/BLUE SHIELD | Admitting: Family Medicine

## 2014-12-21 ENCOUNTER — Encounter: Payer: Self-pay | Admitting: Family Medicine

## 2014-12-21 VITALS — BP 133/72 | HR 92 | Temp 98.8°F | Ht 63.0 in | Wt 268.0 lb

## 2014-12-21 DIAGNOSIS — J209 Acute bronchitis, unspecified: Secondary | ICD-10-CM

## 2014-12-21 DIAGNOSIS — J45901 Unspecified asthma with (acute) exacerbation: Secondary | ICD-10-CM

## 2014-12-21 MED ORDER — ALBUTEROL SULFATE (2.5 MG/3ML) 0.083% IN NEBU
2.5000 mg | INHALATION_SOLUTION | Freq: Once | RESPIRATORY_TRACT | Status: AC
  Administered 2014-12-21: 2.5 mg via RESPIRATORY_TRACT

## 2014-12-21 MED ORDER — IPRATROPIUM-ALBUTEROL 0.5-2.5 (3) MG/3ML IN SOLN: 3.0000 mL | RESPIRATORY_TRACT | Status: DC

## 2014-12-21 MED ORDER — IPRATROPIUM-ALBUTEROL 0.5-2.5 (3) MG/3ML IN SOLN
3.0000 mL | Freq: Once | RESPIRATORY_TRACT | Status: AC
  Administered 2014-12-21: 3 mL via RESPIRATORY_TRACT

## 2014-12-21 MED ORDER — PREDNISONE 20 MG PO TABS: ORAL_TABLET | ORAL | Status: DC

## 2014-12-21 NOTE — Patient Instructions (Signed)
Increase albuterol to 4 puffs every 4  hours for a couple of days, then increase to every 6 hours.

## 2014-12-21 NOTE — Progress Notes (Signed)
   Subjective:    Patient ID: Alison Richards, female    DOB: 16-Jul-1951, 64 y.o.   MRN: [id number]  HPI Seen 2/19 ( 4 days ago) at [facility] for acute bronchitis.  Given IM prednisone, oral doxy and tessalon perles.  Say have fever to 101 last night. No fever today. Sxs started 6 days ago. Mucous is clear.  Right ear has been painful.  No GI upset. Started getting some upper chest pain last night from coughing.  She is really not feeling any better.  Has been taking mucinex as well.    She's been very working very hard to lose weight. She has lost over 30 pounds in the last 6 months which is fantastic. Her BMI was 51 and is now down to 47.  Review of Systems     Objective:   Physical Exam  Constitutional: She is oriented to person, place, and time. She appears well-developed and well-nourished.  HENT:  Head: Normocephalic and atraumatic.  Right Ear: External ear normal.  Left Ear: External ear normal.  Nose: Nose normal.  Mouth/Throat: Oropharynx is clear and moist.  TMs and canals are clear.   Eyes: Conjunctivae and EOM are normal. Pupils are equal, round, and reactive to light.  Neck: Neck supple. No thyromegaly present.  Cardiovascular: Normal rate, regular rhythm and normal heart sounds.   Pulmonary/Chest: Effort normal and breath sounds normal. She has no wheezes.  Diffuse rhonchi  Lymphadenopathy:    She has no cervical adenopathy.  Neurological: She is alert and oriented to person, place, and time.  Skin: Skin is warm and dry.  Psychiatric: She has a normal mood and affect.          Assessment & Plan:  Acute bronchitis -continue the doxycycline.  If has another fever then call and we can change to FQ. Will prescribe a taper for prednisone. Call if not improving in the next 3 days. Work note given for today and tomorrow. Okay to return on Wednesday is feeling better and still afebrile.  Asthma exacerbation - Peak flows in the yellow zone. After 2 nebulized treatments  peak flows were up to 250. Says some mild improvement. Still significant rhonchi on exam after treatment.  Obesity-BMI of 47-she's done a fantastic job. Continue current regimen.

## 2014-12-23 ENCOUNTER — Telehealth: Payer: Self-pay

## 2014-12-23 NOTE — Telephone Encounter (Signed)
Patient called and left a message requesting an additional day to be out of work. Please advise.

## 2014-12-24 NOTE — Telephone Encounter (Signed)
Okay for additional day.

## 2014-12-24 NOTE — Telephone Encounter (Signed)
Patient picked up note 02/24 - CF

## 2015-02-15 ENCOUNTER — Ambulatory Visit (INDEPENDENT_AMBULATORY_CARE_PROVIDER_SITE_OTHER): Payer: BLUE CROSS/BLUE SHIELD | Admitting: Family Medicine

## 2015-02-15 ENCOUNTER — Encounter: Payer: Self-pay | Admitting: Family Medicine

## 2015-02-15 VITALS — BP 129/70 | HR 96 | Wt 276.0 lb

## 2015-02-15 DIAGNOSIS — J4541 Moderate persistent asthma with (acute) exacerbation: Secondary | ICD-10-CM | POA: Diagnosis not present

## 2015-02-15 DIAGNOSIS — R509 Fever, unspecified: Secondary | ICD-10-CM | POA: Diagnosis not present

## 2015-02-15 DIAGNOSIS — J069 Acute upper respiratory infection, unspecified: Secondary | ICD-10-CM

## 2015-02-15 MED ORDER — DOXYCYCLINE HYCLATE 100 MG PO TABS: 100.0000 mg | ORAL_TABLET | Freq: Two times a day (BID) | ORAL | Status: DC

## 2015-02-15 MED ORDER — PREDNISONE 20 MG PO TABS: 40.0000 mg | ORAL_TABLET | Freq: Every day | ORAL | Status: DC

## 2015-02-15 MED ORDER — MOMETASONE FURO-FORMOTEROL FUM 200-5 MCG/ACT IN AERO: 2.0000 | INHALATION_SPRAY | Freq: Two times a day (BID) | RESPIRATORY_TRACT | Status: DC

## 2015-02-15 MED ORDER — MONTELUKAST SODIUM 10 MG PO TABS: 10.0000 mg | ORAL_TABLET | Freq: Every day | ORAL | Status: DC

## 2015-02-15 NOTE — Progress Notes (Signed)
   Subjective:    Patient ID: Alison Richards, female    DOB: July 11, 1951, 64 y.o.   MRN: [id number]  HPI 1 week of cough and sharp pain in head x week. Has felt weak. On dulera daily. Says has had nasal congestion and fever x 2 days.  Took Aleve this AM.  Started wheezing today.    Review of Systems     Objective:   Physical Exam  Constitutional: She is oriented to person, place, and time. She appears well-developed and well-nourished.  HENT:  Head: Normocephalic and atraumatic.  Right Ear: External ear normal.  Left Ear: External ear normal.  Nose: Nose normal.  Mouth/Throat: Oropharynx is clear and moist.  TMs and canals are clear.   Eyes: Conjunctivae and EOM are normal. Pupils are equal, round, and reactive to light.  Neck: Neck supple. No thyromegaly present.  Cardiovascular: Normal rate, regular rhythm and normal heart sounds.   Pulmonary/Chest: Effort normal and breath sounds normal. She has no wheezes.  Lymphadenopathy:    She has no cervical adenopathy.  Neurological: She is alert and oriented to person, place, and time.  Skin: Skin is warm and dry.  Psychiatric: She has a normal mood and affect.          Assessment & Plan:  Asthma exacerbation - discussed will tx  With doxy and can fill the prednisone if needed in next day or two.  Call if not bette rin 3-4 days.  F/U in 2 months for asthma.   URI - viral vs early sinusitis - will tx with doxy.

## 2015-02-18 ENCOUNTER — Telehealth: Payer: Self-pay | Admitting: Family Medicine

## 2015-02-18 NOTE — Telephone Encounter (Signed)
Patient walked-in and adv that she picked up all meds the day she was here and she could not pick up Delara inhaler it was $300.00. Patient adv she is continuing with what she already has at home and today she started wheezing and stated she will start predisone. Is there any other inhaler that is similar that she can have called in that does not cost so much. Thanks

## 2015-02-19 MED ORDER — BUDESONIDE-FORMOTEROL FUMARATE 160-4.5 MCG/ACT IN AERO: 2.0000 | INHALATION_SPRAY | Freq: Two times a day (BID) | RESPIRATORY_TRACT | Status: DC

## 2015-02-19 NOTE — Telephone Encounter (Signed)
We'll send her a prescription for Symbicort. Most likely there should be one that is preferred on her insurance which should be less expensive. If the price on this one is still high then she will need to call her insurance to find out what is comfortable but at a lower price.

## 2015-02-23 NOTE — Telephone Encounter (Signed)
Patient advised to pick up different inhaler.

## 2015-03-11 ENCOUNTER — Other Ambulatory Visit: Payer: Self-pay | Admitting: Family Medicine

## 2015-03-11 DIAGNOSIS — Z1231 Encounter for screening mammogram for malignant neoplasm of breast: Secondary | ICD-10-CM

## 2015-04-07 ENCOUNTER — Ambulatory Visit (INDEPENDENT_AMBULATORY_CARE_PROVIDER_SITE_OTHER): Payer: BLUE CROSS/BLUE SHIELD

## 2015-04-07 DIAGNOSIS — Z1231 Encounter for screening mammogram for malignant neoplasm of breast: Secondary | ICD-10-CM | POA: Diagnosis not present

## 2015-04-19 ENCOUNTER — Ambulatory Visit: Payer: BLUE CROSS/BLUE SHIELD | Admitting: Family Medicine

## 2015-06-18 ENCOUNTER — Encounter (HOSPITAL_BASED_OUTPATIENT_CLINIC_OR_DEPARTMENT_OTHER): Payer: Self-pay | Admitting: *Deleted

## 2015-06-18 ENCOUNTER — Emergency Department (HOSPITAL_BASED_OUTPATIENT_CLINIC_OR_DEPARTMENT_OTHER): Payer: 59

## 2015-06-18 ENCOUNTER — Observation Stay (HOSPITAL_BASED_OUTPATIENT_CLINIC_OR_DEPARTMENT_OTHER)
Admission: EM | Admit: 2015-06-18 | Discharge: 2015-06-20 | Disposition: A | Discharge status: Other Acute Inpatient Hospital | Payer: 59 | Attending: Internal Medicine | Admitting: Internal Medicine

## 2015-06-18 DIAGNOSIS — M79605 Pain in left leg: Secondary | ICD-10-CM

## 2015-06-18 DIAGNOSIS — R079 Chest pain, unspecified: Secondary | ICD-10-CM | POA: Diagnosis not present

## 2015-06-18 DIAGNOSIS — E785 Hyperlipidemia, unspecified: Secondary | ICD-10-CM | POA: Insufficient documentation

## 2015-06-18 DIAGNOSIS — R110 Nausea: Secondary | ICD-10-CM | POA: Insufficient documentation

## 2015-06-18 DIAGNOSIS — I10 Essential (primary) hypertension: Secondary | ICD-10-CM | POA: Insufficient documentation

## 2015-06-18 DIAGNOSIS — M5416 Radiculopathy, lumbar region: Secondary | ICD-10-CM | POA: Insufficient documentation

## 2015-06-18 LAB — COMPREHENSIVE METABOLIC PANEL
ALK PHOS: 67 U/L (ref 38–126)
ALT: 20 U/L (ref 14–54)
AST: 27 U/L (ref 15–41)
Albumin: 4 g/dL (ref 3.5–5.0)
Anion gap: 10 (ref 5–15)
BILIRUBIN TOTAL: 0.5 mg/dL (ref 0.3–1.2)
BUN: 16 mg/dL (ref 6–20)
CO2: 30 mmol/L (ref 22–32)
CREATININE: 0.9 mg/dL (ref 0.44–1.00)
Calcium: 9.5 mg/dL (ref 8.9–10.3)
Chloride: 99 mmol/L — ABNORMAL LOW (ref 101–111)
GFR calc Af Amer: >60 mL/min (ref >60)
Glucose, Bld: 101 mg/dL — ABNORMAL HIGH (ref 65–99)
Potassium: 3.1 mmol/L — ABNORMAL LOW (ref 3.5–5.1)
Sodium: 139 mmol/L (ref 135–145)
Total Protein: 7.8 g/dL (ref 6.5–8.1)

## 2015-06-18 LAB — CBC
HEMATOCRIT: 42.9 % (ref 36.0–46.0)
HEMOGLOBIN: 14.2 g/dL (ref 12.0–15.0)
MCH: 29.7 pg (ref 26.0–34.0)
MCHC: 33.1 g/dL (ref 30.0–36.0)
MCV: 89.7 fL (ref 78.0–100.0)
Platelets: 307 10*3/uL (ref 150–400)
RBC: 4.78 MIL/uL (ref 3.87–5.11)
RDW: 13.5 % (ref 11.5–15.5)
WBC: 14.1 10*3/uL — ABNORMAL HIGH (ref 4.0–10.5)

## 2015-06-18 LAB — TROPONIN I: Troponin I: <0.03 ng/mL (ref <0.031)

## 2015-06-18 LAB — BRAIN NATRIURETIC PEPTIDE: B NATRIURETIC PEPTIDE 5: 15 pg/mL (ref 0.0–100.0)

## 2015-06-18 MED ORDER — PNEUMOCOCCAL VAC POLYVALENT 25 MCG/0.5ML IJ INJ
0.5000 mL | INJECTION | INTRAMUSCULAR | Status: AC
  Administered 2015-06-19: 0.5 mL via INTRAMUSCULAR
  Filled 2015-06-18: qty 0.5

## 2015-06-18 MED ORDER — NITROGLYCERIN 0.4 MG SL SUBL
0.4000 mg | SUBLINGUAL_TABLET | SUBLINGUAL | Status: DC | PRN
  Administered 2015-06-18: 0.4 mg via SUBLINGUAL
  Filled 2015-06-18: qty 1

## 2015-06-18 MED ORDER — POTASSIUM CHLORIDE CRYS ER 20 MEQ PO TBCR
40.0000 meq | EXTENDED_RELEASE_TABLET | Freq: Once | ORAL | Status: AC
  Administered 2015-06-18: 40 meq via ORAL
  Filled 2015-06-18: qty 2

## 2015-06-18 MED ORDER — ASPIRIN 81 MG PO CHEW
324.0000 mg | CHEWABLE_TABLET | Freq: Once | ORAL | Status: AC
  Administered 2015-06-18: 324 mg via ORAL
  Filled 2015-06-18: qty 4

## 2015-06-18 NOTE — ED Provider Notes (Signed)
CSN: [id number]     Arrival date & time 06/18/15  1849 History   First MD Initiated Contact with Patient 06/18/15 1906     Chief Complaint  Patient presents with  . Chest Pain     (Consider location/radiation/quality/duration/timing/severity/associated sxs/prior Treatment) Patient is a 64 y.o. female presenting with chest pain. The history is provided by the patient and medical records.  Chest Pain Associated symptoms: nausea     This is a 64 year old female with history of hypertension, hyperlipidemia, sleep apnea, GERD, arthritis, palpitations ascending to the ED for chest pain. Patient states she was at work this afternoon when she began feeling lightheaded around 1530. States she got up and went to the nurse's office and had her blood pressure checked, was normal at that time. States she returned to work and developed some chest pressure, nausea, and pain in her left shoulder blade. She did not have any shortness of breath. Patient states she started driving home and continued to feel bad so she pulled into urgent care. States she spoke with a nurse regarding her symptoms and was encouraged to come to the ED for further evaluation. Patient states she continues to have some chest discomfort which she states is mild and extends it to "having a bra that is too tight".  She denies any current shortness of breath. Patient does not have known cardiac history. She saw cardiologist 15 years ago for palpitations, were a Holter monitor and was diagnosed PVCs. She states her younger brother who is 30 had a massive MI last year while playing basketball. States mother and father also had heart conditions, unsure what exactly. Patient has never been a smoker.  VSS.  Past Medical History  Diagnosis Date  . Palpitations   . Orthostatic lightheadedness   . Pain in joint   . Hypertension   . Asthma     PAST HX OF ASTHMA WHILE TAKING LISINOPRIL--NO PROBLEMS SINCE DISCONTINUING LISINOPRIL  . Shortness of  breath     WITH EXERTION-PT RELATES TO HER WEIGHT  . Obstructive sleep apnea     CPAP EVERY NIGHT-SETTING IS 8  . Blood transfusion     AGE 60 -HEMORRHAGED AFTER TONSILLECTOMY  . Protein in urine   . GERD (gastroesophageal reflux disease)     PAST HX  H-PYLORI   . H/O hiatal hernia   . Arthritis     PAIN AND OA RIGHT KNEE  . PONV (postoperative nausea and vomiting)   . Obesity   . H. pylori infection     Hx of   . Hyperlipemia   . Kidney stones   . Wears dentures     full top  . Wears glasses    Past Surgical History  Procedure Laterality Date  . Bladder surgery    . Tubal ligation  1979  . Cesarean section  1979  . Abdominal hysterectomy  1999  . Neck sx for ruptured disc c4-5  2001  . Tonsillectomy  age 56  . Cholecystectomy  3-08    Dr Lennie Hummer  . Left knee replacement  2008  . Total knee arthroplasty  11/14/2011    Procedure: TOTAL KNEE ARTHROPLASTY;  Surgeon: Mauri Pole, MD;  Location: [facility];  Service: Orthopedics;  Laterality: Right;  combined with general block  . Hand surgery      Bilateral   . Esophageal manometry  09/02/2012    Procedure: ESOPHAGEAL MANOMETRY (EM);  Surgeon: Sable Feil, MD;  Location: [facility];  Service: Endoscopy;  Laterality: N/A;  . Colonoscopy    . Trigger finger release Right 12/25/2013    Procedure: RIGHT LONG TRIGGER RELEASE ;  Surgeon: Tennis Must, MD;  Location: [place];  Service: Orthopedics;  Laterality: Right;   Family History  Problem Relation Age of Onset  . Alzheimer's disease Mother 41  . Hypertension Mother   . Hyperlipidemia Mother   . Alcohol abuse Father   . Cancer Father   . COPD Father   . Colon cancer Neg Hx   . Lung cancer Father   . Diverticulitis Mother   . Skin cancer Father   . Prostate cancer Father    Social History  Substance Use Topics  . Smoking status: Never Smoker   . Smokeless tobacco: Never Used  . Alcohol Use: No   OB History    No data available      Review of Systems  Cardiovascular: Positive for chest pain.  Gastrointestinal: Positive for nausea.  All other systems reviewed and are negative.     Allergies  Dilaudid; Hydromorphone; Acetaminophen; Erythromycin ethylsuccinate; Lisinopril; Losartan potassium-hctz; Morphine sulfate; Sulfonamide derivatives; Valsartan; and Latex  Home Medications   Prior to Admission medications   Medication Sig Start Date End Date Taking? Authorizing Provider  albuterol (PROVENTIL HFA;VENTOLIN HFA) 108 (90 BASE) MCG/ACT inhaler Inhale 2 puffs into the lungs every 6 (six) hours as needed for wheezing. 12/18/14 12/18/15  Kandra Nicolas, MD  Aliskiren-Hydrochlorothiazide (TEKTURNA HCT) 300-12.5 MG TABS Take 1 tablet by mouth daily before breakfast.     Historical Provider, MD  AMBULATORY NON FORMULARY MEDICATION CPAP Machine Uses at bedtime as directed    Historical Provider, MD  benzonatate (TESSALON) 200 MG capsule Take 1 capsule (200 mg total) by mouth at bedtime. Take as needed for cough 12/18/14   Kandra Nicolas, MD  budesonide-formoterol [facility]) 160-4.5 MCG/ACT inhaler Inhale 2 puffs into the lungs 2 (two) times daily. 02/19/15   Hali Marry, MD  cyclobenzaprine (FLEXERIL) 10 MG tablet Take 1 tablet (10 mg total) by mouth 3 (three) times daily as needed for muscle spasms. 11/04/14   Jade L Breeback, PA-C  doxycycline (VIBRA-TABS) 100 MG tablet Take 1 tablet (100 mg total) by mouth 2 (two) times daily. 02/15/15   Hali Marry, MD  hydrochlorothiazide (HYDRODIURIL) 25 MG tablet Take 12.5 mg by mouth daily.    Historical Provider, MD  montelukast (SINGULAIR) 10 MG tablet Take 1 tablet (10 mg total) by mouth at bedtime. 02/15/15   Hali Marry, MD  polyethylene glycol powder [facility]) powder Take by mouth as needed.  11/04/11   Historical Provider, MD  predniSONE (DELTASONE) 20 MG tablet Take 2 tablets (40 mg total) by mouth daily. 02/15/15   Hali Marry, MD   BP  146/93 mmHg  Pulse 103  Temp(Src) 98.7 F (37.1 C) (Oral)  Resp 20  Ht 5\' 4"  (1.626 m)  Wt 280 lb (127.007 kg)  BMI 48.04 kg/m2  SpO2 99%   Physical Exam  Constitutional: She is oriented to person, place, and time. She appears well-developed and well-nourished. No distress.  HENT:  Head: Normocephalic and atraumatic.  Mouth/Throat: Oropharynx is clear and moist.  Eyes: Conjunctivae and EOM are normal. Pupils are equal, round, and reactive to light.  Neck: Normal range of motion. Neck supple.  Cardiovascular: Normal rate, regular rhythm and normal heart sounds.   Pulmonary/Chest: Effort normal and breath sounds normal. No respiratory distress. She has no wheezes.  Abdominal: Soft. Bowel sounds are normal. There is no tenderness. There is no guarding.  Musculoskeletal: Normal range of motion.  Trace edema bilateral lower extremities No calf asymmetry, tenderness, or palpable cords; no overlying skin changes or warmth to touch  Neurological: She is alert and oriented to person, place, and time.  Skin: Skin is warm. She is not diaphoretic.  Psychiatric: She has a normal mood and affect.  Nursing note and vitals reviewed.   ED Course  Procedures (including critical care time) Labs Review Labs Reviewed  CBC - Abnormal; Notable for the following:    WBC 14.1 (*)    All other components within normal limits  COMPREHENSIVE METABOLIC PANEL - Abnormal; Notable for the following:    Potassium 3.1 (*)    Chloride 99 (*)    Glucose, Bld 101 (*)    All other components within normal limits  TROPONIN I  BRAIN NATRIURETIC PEPTIDE    Imaging Review Dg Chest 2 View  06/18/2015   CLINICAL DATA:  Patient with cough and central chest pain.  EXAM: CHEST  2 VIEW  COMPARISON:  Chest radiograph 12/18/2014  FINDINGS: Monitoring leads overlie the patient. Stable enlarged cardiac and mediastinal contours. No consolidative pulmonary opacities. Probable basilar atelectasis. No pleural effusion or  pneumothorax. Anterior cervical spinal fusion hardware.  IMPRESSION: No acute cardiopulmonary process.  Probable basilar atelectasis.   Electronically Signed   By: Lovey Newcomer M.D.   On: 06/18/2015 19:48   I have personally reviewed and evaluated these images and lab results as part of my medical decision-making.   EKG Interpretation   Date/Time:  Friday June 18 2015 18:56:29 EDT Ventricular Rate:  100 PR Interval:  180 QRS Duration: 92 QT Interval:  352 QTC Calculation: 454 R Axis:   48 Text Interpretation:  Normal sinus rhythm Low voltage QRS Borderline ECG  since last tracing no significant change Confirmed by BELFI  MD, MELANIE  ([id number]) on 06/18/2015 7:00:27 PM      MDM   Final diagnoses:  Chest pain, unspecified chest pain type   64 year old female here with chest discomfort, left shoulder pain, and nausea are working today. Patient has extensive family history of cardiac disease and MI. EKG sinus rhythm without acute ischemic changes. Lab work is reassuring aside from mild hypokalemia which was replaced here in ED. Chest x-ray is clear. Patient was given aspirin and nitroglycerin in the ED.  Patient reports symptoms have improved somewhat and she denies current chest pain, however states she does not feel "normal". Given patient's risk factors, heart score of 5, feel she would benefit from admission and cardiac rule out. Will discuss with hospitalist for admission.  Case discussed with Dr. Nehemiah Settle at [facility] who agrees to admit.  Temp admission orders placed.  EMTALA completed.  Larene Pickett, PA-C 06/18/15 2124  Malvin Johns, MD 06/18/15 2130

## 2015-06-18 NOTE — ED Notes (Signed)
Pt reports that she started getting lightheaded, left shoulder pain, nausea and chest pressure in her at 330pm while working.  Denies SOB.

## 2015-06-18 NOTE — ED Notes (Signed)
pa at bedside. 

## 2015-06-18 NOTE — ED Notes (Signed)
Attempt to call report nurse not available 

## 2015-06-19 ENCOUNTER — Observation Stay (HOSPITAL_COMMUNITY): Payer: 59

## 2015-06-19 ENCOUNTER — Encounter (HOSPITAL_COMMUNITY): Payer: Self-pay | Admitting: Family Medicine

## 2015-06-19 DIAGNOSIS — E785 Hyperlipidemia, unspecified: Secondary | ICD-10-CM

## 2015-06-19 DIAGNOSIS — R0789 Other chest pain: Secondary | ICD-10-CM | POA: Diagnosis not present

## 2015-06-19 DIAGNOSIS — I209 Angina pectoris, unspecified: Secondary | ICD-10-CM | POA: Diagnosis not present

## 2015-06-19 DIAGNOSIS — R079 Chest pain, unspecified: Secondary | ICD-10-CM | POA: Diagnosis not present

## 2015-06-19 DIAGNOSIS — I10 Essential (primary) hypertension: Secondary | ICD-10-CM | POA: Insufficient documentation

## 2015-06-19 DIAGNOSIS — M79605 Pain in left leg: Secondary | ICD-10-CM | POA: Diagnosis not present

## 2015-06-19 LAB — CBC
HEMATOCRIT: 38.1 % (ref 36.0–46.0)
Hemoglobin: 13.3 g/dL (ref 12.0–15.0)
MCH: 30.9 pg (ref 26.0–34.0)
MCHC: 34.9 g/dL (ref 30.0–36.0)
MCV: 88.6 fL (ref 78.0–100.0)
Platelets: 275 10*3/uL (ref 150–400)
RBC: 4.3 MIL/uL (ref 3.87–5.11)
RDW: 13.4 % (ref 11.5–15.5)
WBC: 12.6 10*3/uL — AB (ref 4.0–10.5)

## 2015-06-19 LAB — TROPONIN I
Troponin I: 0.03 ng/mL (ref <0.031)
Troponin I: <0.03 ng/mL (ref <0.031)

## 2015-06-19 LAB — CREATININE, SERUM
Creatinine, Ser: 0.87 mg/dL (ref 0.44–1.00)
GFR calc non Af Amer: >60 mL/min (ref >60)

## 2015-06-19 MED ORDER — ASPIRIN EC 325 MG PO TBEC
325.0000 mg | DELAYED_RELEASE_TABLET | Freq: Every day | ORAL | Status: DC
  Administered 2015-06-19 – 2015-06-20 (×2): 325 mg via ORAL
  Filled 2015-06-19 (×3): qty 1

## 2015-06-19 MED ORDER — ACETAMINOPHEN 325 MG PO TABS
650.0000 mg | ORAL_TABLET | ORAL | Status: DC | PRN
  Filled 2015-06-19: qty 2

## 2015-06-19 MED ORDER — HYDROCHLOROTHIAZIDE 25 MG PO TABS
25.0000 mg | ORAL_TABLET | Freq: Every day | ORAL | Status: DC
  Administered 2015-06-19 – 2015-06-20 (×2): 25 mg via ORAL
  Filled 2015-06-19 (×2): qty 1

## 2015-06-19 MED ORDER — ALISKIREN FUMARATE 150 MG PO TABS
300.0000 mg | ORAL_TABLET | Freq: Every day | ORAL | Status: DC
  Administered 2015-06-19 – 2015-06-20 (×2): 300 mg via ORAL
  Filled 2015-06-19 (×2): qty 2

## 2015-06-19 MED ORDER — REGADENOSON 0.4 MG/5ML IV SOLN
INTRAVENOUS | Status: AC
  Administered 2015-06-19: 0.4 mg
  Filled 2015-06-19: qty 5

## 2015-06-19 MED ORDER — POTASSIUM CHLORIDE CRYS ER 20 MEQ PO TBCR
40.0000 meq | EXTENDED_RELEASE_TABLET | Freq: Once | ORAL | Status: AC
  Administered 2015-06-19: 40 meq via ORAL
  Filled 2015-06-19: qty 2

## 2015-06-19 MED ORDER — ONDANSETRON HCL 4 MG/2ML IJ SOLN: 4.0000 mg | Freq: Four times a day (QID) | INTRAMUSCULAR | Status: DC | PRN

## 2015-06-19 MED ORDER — ALISKIREN-HYDROCHLOROTHIAZIDE 300-12.5 MG PO TABS: 1.0000 | ORAL_TABLET | Freq: Every day | ORAL | Status: DC

## 2015-06-19 MED ORDER — HYDROCHLOROTHIAZIDE 25 MG PO TABS: 12.5000 mg | ORAL_TABLET | Freq: Every day | ORAL | Status: DC

## 2015-06-19 MED ORDER — GI COCKTAIL ~~LOC~~: 30.0000 mL | Freq: Four times a day (QID) | ORAL | Status: DC | PRN

## 2015-06-19 MED ORDER — ZOLPIDEM TARTRATE 5 MG PO TABS: 5.0000 mg | ORAL_TABLET | Freq: Every evening | ORAL | Status: DC | PRN

## 2015-06-19 MED ORDER — ENOXAPARIN SODIUM 40 MG/0.4ML ~~LOC~~ SOLN
40.0000 mg | Freq: Every day | SUBCUTANEOUS | Status: DC
  Administered 2015-06-19 – 2015-06-20 (×2): 40 mg via SUBCUTANEOUS
  Filled 2015-06-19 (×2): qty 0.4

## 2015-06-19 MED ORDER — ALBUTEROL SULFATE (2.5 MG/3ML) 0.083% IN NEBU: 2.5000 mg | INHALATION_SOLUTION | Freq: Four times a day (QID) | RESPIRATORY_TRACT | Status: DC | PRN

## 2015-06-19 MED ORDER — ATORVASTATIN CALCIUM 10 MG PO TABS
10.0000 mg | ORAL_TABLET | Freq: Every day | ORAL | Status: DC
  Filled 2015-06-19: qty 1

## 2015-06-19 NOTE — Progress Notes (Signed)
Placed pt on cpap at [facility], pt tolerating well at this time

## 2015-06-19 NOTE — Progress Notes (Signed)
Echocardiogram 2D Echocardiogram has been performed.  Alison Richards 06/19/2015, 10:30 AM

## 2015-06-19 NOTE — Progress Notes (Signed)
[facility] PROGRESS NOTE   Alison Richards HER:[id number] DOB: 12/06/1950 DOA: 06/18/2015 PCP: Beatrice Lecher, MD  HPI/Subjective: No chest pain, denies other significant complaints.  Assessment/Plan: Active Problems:   Chest pain   Essential hypertension   Hyperlipidemia   Patient seen earlier today by my colleague Dr. Nehemiah Settle. Presented with chest pain, this is a no charge note. Seen by cardiology, currently chest pain-free for stress test.  Code Status: Full Code Family Communication: Plan discussed with the patient. Disposition Plan: Remains inpatient Diet: Diet Heart Room service appropriate?: Yes; Fluid consistency:: Thin  Consultants:  Cardiology  Procedures:  None  Antibiotics:  No antibiotics   Objective: Filed Vitals:   06/19/15 0400  BP: 111/65  Pulse: 88  Temp: 97.9 F (36.6 C)  Resp: 20    Intake/Output Summary (Last 24 hours) at 06/19/15 1135 Last data filed at 06/18/15 2220  Gross per 24 hour  Intake      0 ml  Output    450 ml  Net   -450 ml   Filed Weights   06/18/15 1858 06/18/15 2222  Weight: 127.007 kg (280 lb) 131.316 kg (289 lb 8 oz)    Exam: General: Alert and awake, oriented x3, not in any acute distress. HEENT: anicteric sclera, pupils reactive to light and accommodation, EOMI CVS: S1-S2 clear, no murmur rubs or gallops Chest: clear to auscultation bilaterally, no wheezing, rales or rhonchi Abdomen: soft nontender, nondistended, normal bowel sounds, no organomegaly Extremities: no cyanosis, clubbing or edema noted bilaterally Neuro: Cranial nerves II-XII intact, no focal neurological deficits  Data Reviewed: Basic Metabolic Panel:  Recent Labs Lab 06/18/15 1911 06/19/15 0155  NA 139  --   K 3.1*  --   CL 99*  --   CO2 30  --   GLUCOSE 101*  --   BUN 16  --   CREATININE 0.90 0.87  CALCIUM 9.5  --    Liver Function Tests:  Recent Labs Lab 06/18/15 1911  AST 27  ALT 20  ALKPHOS 67    BILITOT 0.5  PROT 7.8  ALBUMIN 4.0   No results for input(s): LIPASE, AMYLASE in the last 168 hours. No results for input(s): AMMONIA in the last 168 hours. CBC:  Recent Labs Lab 06/18/15 1911 06/19/15 0155  WBC 14.1* 12.6*  HGB 14.2 13.3  HCT 42.9 38.1  MCV 89.7 88.6  PLT 307 275   Cardiac Enzymes:  Recent Labs Lab 06/18/15 1911 06/19/15 0155 06/19/15 0900  TROPONINI <0.03 <0.03 0.03   BNP (last 3 results)  Recent Labs  06/18/15 1929  BNP 15.0    ProBNP (last 3 results) No results for input(s): PROBNP in the last 8760 hours.  CBG: No results for input(s): GLUCAP in the last 168 hours.  Micro No results found for this or any previous visit (from the past 240 hour(s)).   Studies: Dg Chest 2 View  06/18/2015   CLINICAL DATA:  Patient with cough and central chest pain.  EXAM: CHEST  2 VIEW  COMPARISON:  Chest radiograph 12/18/2014  FINDINGS: Monitoring leads overlie the patient. Stable enlarged cardiac and mediastinal contours. No consolidative pulmonary opacities. Probable basilar atelectasis. No pleural effusion or pneumothorax. Anterior cervical spinal fusion hardware.  IMPRESSION: No acute cardiopulmonary process.  Probable basilar atelectasis.   Electronically Signed   By: Lovey Newcomer M.D.   On: 06/18/2015 19:48    Scheduled Meds: . aliskiren  300 mg Oral Daily   And  . hydrochlorothiazide  25  mg Oral Daily  . aspirin EC  325 mg Oral Daily  . atorvastatin  10 mg Oral q1800  . enoxaparin (LOVENOX) injection  40 mg Subcutaneous Daily   Continuous Infusions:      Time spent: 35 minutes    [facility] A  [facility] Pager [phone] If 7PM-7AM, please contact night-coverage at [email or web address], password [facility] 06/19/2015, 11:35 AM

## 2015-06-19 NOTE — Progress Notes (Signed)
CARDIOLOGY CONSULT NOTE   Patient ID: Alison Richards MRN: [id number], DOB/AGE: January 23, 1951   Admit date: 06/18/2015 Date of Consult: 06/19/2015  Primary Physician: Beatrice Lecher, MD Primary Cardiologist: new patient  Reason for consult:  Chest pain  Problem List  Past Medical History  Diagnosis Date  . Palpitations   . Orthostatic lightheadedness   . Pain in joint   . Hypertension   . Asthma     PAST HX OF ASTHMA WHILE TAKING LISINOPRIL--NO PROBLEMS SINCE DISCONTINUING LISINOPRIL  . Shortness of breath     WITH EXERTION-PT RELATES TO HER WEIGHT  . Obstructive sleep apnea     CPAP EVERY NIGHT-SETTING IS 8  . Blood transfusion     AGE 44 -HEMORRHAGED AFTER TONSILLECTOMY  . Protein in urine   . GERD (gastroesophageal reflux disease)     PAST HX  H-PYLORI   . H/O hiatal hernia   . Arthritis     PAIN AND OA RIGHT KNEE  . PONV (postoperative nausea and vomiting)   . Obesity   . H. pylori infection     Hx of   . Hyperlipemia   . Kidney stones   . Wears dentures     full top  . Wears glasses     Past Surgical History  Procedure Laterality Date  . Bladder surgery    . Tubal ligation  1979  . Cesarean section  1979  . Abdominal hysterectomy  1999  . Neck sx for ruptured disc c4-5  2001  . Tonsillectomy  age 11  . Cholecystectomy  3-08    Dr Lennie Hummer  . Left knee replacement  2008  . Total knee arthroplasty  11/14/2011    Procedure: TOTAL KNEE ARTHROPLASTY;  Surgeon: Mauri Pole, MD;  Location: [facility];  Service: Orthopedics;  Laterality: Right;  combined with general block  . Hand surgery      Bilateral   . Esophageal manometry  09/02/2012    Procedure: ESOPHAGEAL MANOMETRY (EM);  Surgeon: Sable Feil, MD;  [facility];  Service: Endoscopy;  Laterality: N/A;  . Colonoscopy    . Trigger finger release Right 12/25/2013    Procedure: RIGHT LONG TRIGGER RELEASE ;  Surgeon: Tennis Must, MD;  Location: [place];  Service:  Orthopedics;  Laterality: Right;     Allergies  Allergies  Allergen Reactions  . Dilaudid [Hydromorphone Hcl] Swelling    Throat closing up  . Hydromorphone Shortness Of Breath    Pt had a tightening in her throat  . Acetaminophen     REACTION: hallucinations, H/A AND SEVERE SWELLING  . Erythromycin Ethylsuccinate     REACTION: gi symptoms  . Lisinopril     ASTHMA LIKE SYMPTOMS  . Losartan Potassium-Hctz     REACTION: JOINTS aching, hurting all over  . Morphine Sulfate     REACTION: projectile vomiting  . Sulfonamide Derivatives     REACTION: Rash, swelling  . Valsartan     REACTION: Hurting all over  . Latex Rash    WHEN PT WORE LATEX GLOVES-SHE HAD RASH.  SENSITIVE TO SOME ADHESIVES.    HPI   Alison Richards is a 64 y.o. female  History of hypertension, asthma/COPD, sleep apnea with C Pap use, GERD, arthritis grade patient presented to the emergency department with onset of chest pain approximately 3:30. She had concomitant headedness, nausea, with radiation of the pain into her left jaw and left shoulder blade. 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 The emergency part and she received nitroglycerin which improved her chest pain, however she still "did not feel right". Currently the patient is chest pain-free and without any other symptoms except for a mild headache.  She has family history of myocardial infarction: Her younger brother had a massive MI at age 61. Her father mother also have a history of MI, age uncertain  Inpatient Medications  . aliskiren  300 mg Oral Daily   And  . hydrochlorothiazide  25 mg Oral Daily  . aspirin EC  325 mg Oral Daily  . enoxaparin (LOVENOX) injection  40 mg Subcutaneous Daily    Family History Family History  Problem Relation Age of Onset  . Alzheimer's disease Mother 32  . Hypertension Mother   . Hyperlipidemia Mother    . Alcohol abuse Father   . Cancer Father   . COPD Father   . Colon cancer Neg Hx   . Lung cancer Father   . Diverticulitis Mother   . Skin cancer Father   . Prostate cancer Father      Social History Social History   Social History  . Marital Status: Married    Spouse Name: Herbie Baltimore  . Number of Children: 3  . Years of Education: N/A   Occupational History  . LAB [employer]    Social History Main Topics  . Smoking status: Never Smoker   . Smokeless tobacco: Never Used  . Alcohol Use: No  . Drug Use: No  . Sexual Activity:    Partners: Male   Other Topics Concern  . Not on file   Social History Narrative   Daily caffeine      Review of Systems  General:  No chills, fever, night sweats or weight changes.  Cardiovascular:  No chest pain, dyspnea on exertion, edema, orthopnea, palpitations, paroxysmal nocturnal dyspnea. Dermatological: No rash, lesions/masses Respiratory: No cough, dyspnea Urologic: No hematuria, dysuria Abdominal:   No nausea, vomiting, diarrhea, bright red blood per rectum, melena, or hematemesis Neurologic:  No visual changes, wkns, changes in mental status. All other systems reviewed and are otherwise negative except as noted above.  Physical Exam  Blood pressure 111/65, pulse 88, temperature 97.9 F (36.6 C), temperature source Oral, resp. rate 20, height 5\' 4"  (1.626 m), weight 289 lb 8 oz (131.316 kg), SpO2 95 %.  General: Pleasant, NAD Psych: Normal affect. Neuro: Alert and oriented X 3. Moves all extremities spontaneously. HEENT: Normal  Neck: Supple without bruits or JVD. Lungs:  Resp regular and unlabored, CTA. Heart: RRR no s3, s4, or murmurs. Abdomen: Soft, non-tender, non-distended, BS + x 4.  Extremities: No clubbing, cyanosis or edema. DP/PT/Radials 2+ and equal bilaterally.  Labs   Recent Labs  06/18/15 1911 06/19/15 0155 06/19/15 0900  TROPONINI <0.03 <0.03 0.03   Lab Results  Component Value Date   WBC 12.6*  06/19/2015   HGB 13.3 06/19/2015   HCT 38.1 06/19/2015   MCV 88.6 06/19/2015   PLT 275 06/19/2015    Recent Labs Lab 06/18/15 1911 06/19/15 0155  NA 139  --   K 3.1*  --   CL 99*  --   CO2 30  --   BUN 16  --   CREATININE 0.90 0.87  CALCIUM 9.5  --   PROT 7.8  --   BILITOT 0.5  --   ALKPHOS 67  --  ALT 20  --   AST 27  --   GLUCOSE 101*  --    Lab Results  Component Value Date   CHOL 210* 02/12/2014   HDL 62 02/12/2014   LDLCALC 114* 02/12/2014   TRIG 168* 02/12/2014   Radiology/Studies  Dg Chest 2 View  06/18/2015   CLINICAL DATA:  Patient with cough and central chest pain.  EXAM: CHEST  2 VIEW  COMPARISON:  Chest radiograph 12/18/2014  FINDINGS: Monitoring leads overlie the patient. Stable enlarged cardiac and mediastinal contours. No consolidative pulmonary opacities. Probable basilar atelectasis. No pleural effusion or pneumothorax. Anterior cervical spinal fusion hardware.  IMPRESSION: No acute cardiopulmonary process.  Probable basilar atelectasis.   Electronically Signed   By: Lovey Newcomer M.D.   On: 06/18/2015 19:48   Echocardiogram - pending  ECG: SR, low voltage ECG, otherwise normal , unchanged from 2013    ASSESSMENT AND PLAN  1. Typical retrosternal chest pain - risk factors include FH of CAD, HTN, obesity, ECG unchanged from prior, we will schedule a [employer], since she is too heavy for 1 day test we will try stress first - today and if normal discharge.   2. HTN - controlled  3. HLP - start low dose atorvastatin 10 mg po daily   Signed, Dorothy Spark, MD, [facility] 06/19/2015, 10:55 AM

## 2015-06-19 NOTE — Progress Notes (Signed)
Part 1 (stress portion) of 2 day lexiscan myoview completed without significant complication. Pending resting image tomorrow, expect result of myoview tomorrow afternoon.  2 day protocol because weight >225 for female.  Hilbert Corrigan PA Pager: [phone]

## 2015-06-19 NOTE — H&P (Signed)
History and Physical  Alison Richards LEX:[id number] DOB: 06/12/1951 DOA: 06/18/2015  Referring physician: Quincy Carnes, Hershal Coria, ED provider PCP: Beatrice Lecher, MD   Chief Complaint: Chest pain  HPI: Alison Richards is a 64 y.o. female  History of hypertension, asthma/COPD, sleep apnea with C Pap use, GERD, arthritis grade patient presented to the emergency department with onset of chest pain approximately 3:30. She had concomitant headedness, nausea, with radiation of the pain into her left jaw and left shoulder blade. 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 The emergency part and she received nitroglycerin which improved her chest pain, however she still "did not feel right". Currently the patient is chest pain-free and without any other symptoms except for a mild headache.  She has family history of myocardial infarction: Her younger brother had a massive MI at age 65. Her father mother also have a history of MI, age uncertain.   Review of Systems:   Pt denies any fevers, chills, nausea, vomiting, palpitations, shortness of breath, wheezing, cough, orthopnea, or vision, dizziness, lightheadedness, abdominal pain, vomiting, diarrhea, constipation, melena, hematochezia, rectal bleeding.  Review of systems are otherwise negative  Past Medical History  Diagnosis Date  . Palpitations   . Orthostatic lightheadedness   . Pain in joint   . Hypertension   . Asthma     PAST HX OF ASTHMA WHILE TAKING LISINOPRIL--NO PROBLEMS SINCE DISCONTINUING LISINOPRIL  . Shortness of breath     WITH EXERTION-PT RELATES TO HER WEIGHT  . Obstructive sleep apnea     CPAP EVERY NIGHT-SETTING IS 8  . Blood transfusion     AGE 60 -HEMORRHAGED AFTER TONSILLECTOMY  . Protein in urine   . GERD (gastroesophageal reflux disease)     PAST HX  H-PYLORI   . H/O hiatal hernia   . Arthritis     PAIN  AND OA RIGHT KNEE  . PONV (postoperative nausea and vomiting)   . Obesity   . H. pylori infection     Hx of   . Hyperlipemia   . Kidney stones   . Wears dentures     full top  . Wears glasses    Past Surgical History  Procedure Laterality Date  . Bladder surgery    . Tubal ligation  1979  . Cesarean section  1979  . Abdominal hysterectomy  1999  . Neck sx for ruptured disc c4-5  2001  . Tonsillectomy  age 41  . Cholecystectomy  3-08    Dr Lennie Hummer  . Left knee replacement  2008  . Total knee arthroplasty  11/14/2011    Procedure: TOTAL KNEE ARTHROPLASTY;  Surgeon: Mauri Pole, MD;  Location: [facility];  Service: Orthopedics;  Laterality: Right;  combined with general block  . Hand surgery      Bilateral   . Esophageal manometry  09/02/2012    Procedure: ESOPHAGEAL MANOMETRY (EM);  Surgeon: Sable Feil, MD;  Location: [facility];  Service: Endoscopy;  Laterality: N/A;  . Colonoscopy    . Trigger finger release Right 12/25/2013    Procedure: RIGHT LONG TRIGGER RELEASE ;  Surgeon: Tennis Must, MD;  Location: [facility];  Service: Orthopedics;  Laterality: Right;   Social History:  reports that she has never smoked. She has never used smokeless tobacco. She reports that she does not drink alcohol or use illicit drugs. Patient  lives at home & is able to participate in activities of daily living  Allergies  Allergen Reactions  . Dilaudid [Hydromorphone Hcl] Swelling    Throat closing up  . Hydromorphone Shortness Of Breath    Pt had a tightening in her throat  . Acetaminophen     REACTION: hallucinations, H/A AND SEVERE SWELLING  . Erythromycin Ethylsuccinate     REACTION: gi symptoms  . Lisinopril     ASTHMA LIKE SYMPTOMS  . Losartan Potassium-Hctz     REACTION: JOINTS aching, hurting all over  . Morphine Sulfate     REACTION: projectile vomiting  . Sulfonamide Derivatives     REACTION: Rash, swelling  . Valsartan     REACTION: Hurting all over    . Latex Rash    WHEN PT WORE LATEX GLOVES-SHE HAD RASH.  SENSITIVE TO SOME ADHESIVES.    Family History  Problem Relation Age of Onset  . Alzheimer's disease Mother 60  . Hypertension Mother   . Hyperlipidemia Mother   . Alcohol abuse Father   . Cancer Father   . COPD Father   . Colon cancer Neg Hx   . Lung cancer Father   . Diverticulitis Mother   . Skin cancer Father   . Prostate cancer Father      Prior to Admission medications   Medication Sig Start Date End Date Taking? Authorizing Provider  albuterol (PROVENTIL HFA;VENTOLIN HFA) 108 (90 BASE) MCG/ACT inhaler Inhale 2 puffs into the lungs every 6 (six) hours as needed for wheezing. 12/18/14 12/18/15  Kandra Nicolas, MD  Aliskiren-Hydrochlorothiazide (TEKTURNA HCT) 300-12.5 MG TABS Take 1 tablet by mouth daily before breakfast.     Historical Provider, MD  AMBULATORY NON FORMULARY MEDICATION CPAP Machine Uses at bedtime as directed    Historical Provider, MD  benzonatate (TESSALON) 200 MG capsule Take 1 capsule (200 mg total) by mouth at bedtime. Take as needed for cough 12/18/14   Kandra Nicolas, MD  budesonide-formoterol [facility]) 160-4.5 MCG/ACT inhaler Inhale 2 puffs into the lungs 2 (two) times daily. 02/19/15   Hali Marry, MD  cyclobenzaprine (FLEXERIL) 10 MG tablet Take 1 tablet (10 mg total) by mouth 3 (three) times daily as needed for muscle spasms. 11/04/14   Jade L Breeback, PA-C  hydrochlorothiazide (HYDRODIURIL) 25 MG tablet Take 12.5 mg by mouth daily.    Historical Provider, MD  montelukast (SINGULAIR) 10 MG tablet Take 1 tablet (10 mg total) by mouth at bedtime. 02/15/15   Hali Marry, MD  polyethylene glycol powder [facility]) powder Take by mouth as needed.  11/04/11   Historical Provider, MD    Physical Exam: BP 156/77 mmHg  Pulse 96  Temp(Src) 99.2 F (37.3 C) (Oral)  Resp 20  Ht 5\' 4"  (1.626 m)  Wt 131.316 kg (289 lb 8 oz)  BMI 49.67 kg/m2  SpO2 94%  General: Older Caucasian  female. Awake and alert and oriented x3. No acute cardiopulmonary distress.  Eyes: Pupils equal, round. Extraocular muscles are intact. Sclerae anicteric and noninjected.  ENT:  Moist mucosal membranes. No mucosal lesions. Teeth in good repair  Neck: Neck supple without lymphadenopathy. No carotid bruits. No masses palpated.  Cardiovascular: Regular rate with normal S1-S2 sounds. No murmurs, rubs, gallops auscultated. No JVD.  Respiratory: Good respiratory effort with no wheezes, rales, rhonchi. Lungs clear to auscultation bilaterally.  Abdomen: Obese. Soft, nontender, nondistended. Active bowel sounds. No masses or hepatosplenomegaly  Skin: Dry, warm to touch. 2+ dorsalis pedis and  radial pulses. Musculoskeletal: No calf or leg pain. All major joints not erythematous nontender.  Psychiatric: Intact judgment and insight.  Neurologic: No focal neurological deficits. Cranial nerves II through XII are grossly intact.           Labs on Admission:  Basic Metabolic Panel:  Recent Labs Lab 06/18/15 1911  NA 139  K 3.1*  CL 99*  CO2 30  GLUCOSE 101*  BUN 16  CREATININE 0.90  CALCIUM 9.5   Liver Function Tests:  Recent Labs Lab 06/18/15 1911  AST 27  ALT 20  ALKPHOS 67  BILITOT 0.5  PROT 7.8  ALBUMIN 4.0   No results for input(s): LIPASE, AMYLASE in the last 168 hours. No results for input(s): AMMONIA in the last 168 hours. CBC:  Recent Labs Lab 06/18/15 1911  WBC 14.1*  HGB 14.2  HCT 42.9  MCV 89.7  PLT 307   Cardiac Enzymes:  Recent Labs Lab 06/18/15 1911  TROPONINI <0.03    BNP (last 3 results)  Recent Labs  06/18/15 1929  BNP 15.0    ProBNP (last 3 results) No results for input(s): PROBNP in the last 8760 hours.  CBG: No results for input(s): GLUCAP in the last 168 hours.  Radiological Exams on Admission: Dg Chest 2 View  06/18/2015   CLINICAL DATA:  Patient with cough and central chest pain.  EXAM: CHEST  2 VIEW  COMPARISON:  Chest radiograph  12/18/2014  FINDINGS: Monitoring leads overlie the patient. Stable enlarged cardiac and mediastinal contours. No consolidative pulmonary opacities. Probable basilar atelectasis. No pleural effusion or pneumothorax. Anterior cervical spinal fusion hardware.  IMPRESSION: No acute cardiopulmonary process.  Probable basilar atelectasis.   Electronically Signed   By: Lovey Newcomer M.D.   On: 06/18/2015 19:48    EKG: Independently reviewed. Normal sinus rhythm with normal intervals. No acute ST changes.  Assessment/Plan Present on Admission:  . Chest pain  This patient was discussed with the ED physician, including pertinent vitals, physical exam findings, labs, and imaging.  We also discussed care given by the ED provider.  #1 chest pain - heart score of 5  Admit to telemetry for observation.  Cycle troponins  Full aspirin  Echocardiogram in the morning #2 hypertension  Continue blood pressure medications #3 objective sleep apnea  CPAP  DVT prophylaxis: Lovenox  Consultants: None  Code Status: Full code  Family Communication: None   Disposition Plan: Observation   Truett Mainland, DO [facility] Pager [phone]

## 2015-06-20 ENCOUNTER — Observation Stay (HOSPITAL_COMMUNITY): Payer: 59

## 2015-06-20 DIAGNOSIS — R079 Chest pain, unspecified: Secondary | ICD-10-CM | POA: Insufficient documentation

## 2015-06-20 DIAGNOSIS — I10 Essential (primary) hypertension: Secondary | ICD-10-CM | POA: Diagnosis not present

## 2015-06-20 DIAGNOSIS — I209 Angina pectoris, unspecified: Secondary | ICD-10-CM | POA: Diagnosis not present

## 2015-06-20 DIAGNOSIS — M79605 Pain in left leg: Secondary | ICD-10-CM

## 2015-06-20 DIAGNOSIS — E785 Hyperlipidemia, unspecified: Secondary | ICD-10-CM | POA: Diagnosis not present

## 2015-06-20 DIAGNOSIS — M5416 Radiculopathy, lumbar region: Secondary | ICD-10-CM | POA: Insufficient documentation

## 2015-06-20 LAB — BASIC METABOLIC PANEL
ANION GAP: 10 (ref 5–15)
BUN: 13 mg/dL (ref 6–20)
CHLORIDE: 104 mmol/L (ref 101–111)
CO2: 25 mmol/L (ref 22–32)
Calcium: 8.6 mg/dL — ABNORMAL LOW (ref 8.9–10.3)
Creatinine, Ser: 0.87 mg/dL (ref 0.44–1.00)
GFR calc Af Amer: >60 mL/min (ref >60)
Glucose, Bld: 111 mg/dL — ABNORMAL HIGH (ref 65–99)
POTASSIUM: 3.5 mmol/L (ref 3.5–5.1)
SODIUM: 139 mmol/L (ref 135–145)

## 2015-06-20 LAB — CBC
HEMATOCRIT: 39.4 % (ref 36.0–46.0)
HEMOGLOBIN: 13.1 g/dL (ref 12.0–15.0)
MCH: 29.9 pg (ref 26.0–34.0)
MCHC: 33.2 g/dL (ref 30.0–36.0)
MCV: 90 fL (ref 78.0–100.0)
Platelets: 266 10*3/uL (ref 150–400)
RBC: 4.38 MIL/uL (ref 3.87–5.11)
RDW: 13.6 % (ref 11.5–15.5)
WBC: 10.1 10*3/uL (ref 4.0–10.5)

## 2015-06-20 MED ORDER — ATORVASTATIN CALCIUM 10 MG PO TABS: 10.0000 mg | ORAL_TABLET | Freq: Every day | ORAL | Status: DC

## 2015-06-20 MED ORDER — TECHNETIUM TC 99M SESTAMIBI - CARDIOLITE
32.8000 | Freq: Once | INTRAVENOUS | Status: AC | PRN
  Administered 2015-06-19: 14:00:00 32.8 via INTRAVENOUS

## 2015-06-20 MED ORDER — TECHNETIUM TC 99M SESTAMIBI GENERIC - CARDIOLITE
31.4000 | Freq: Once | INTRAVENOUS | Status: AC | PRN
  Administered 2015-06-20: 31 via INTRAVENOUS

## 2015-06-20 MED ORDER — POTASSIUM CHLORIDE CRYS ER 20 MEQ PO TBCR: 40.0000 meq | EXTENDED_RELEASE_TABLET | Freq: Once | ORAL | Status: DC

## 2015-06-20 NOTE — Progress Notes (Signed)
VASCULAR LAB PRELIMINARY  PRELIMINARY  PRELIMINARY  PRELIMINARY  Left lower extremity venous duplex completed.    Preliminary report:  Left:  No evidence of DVT, superficial thrombosis, or Baker's cyst.  Carlise Stofer, RVT 06/20/2015, 11:16 AM

## 2015-06-20 NOTE — Progress Notes (Signed)
CARDIOLOGY PROGRESS NOTE   Patient ID: Alison Richards MRN: [id number], DOB/AGE: [birth year]    Subjective: No more chest pain or SOB.   HPI   Alison Richards is a 64 y.o. female  History of hypertension, asthma/COPD, sleep apnea with C Pap use, GERD, arthritis grade patient presented to the emergency department with onset of chest pain approximately 3:30. She had concomitant headedness, nausea, with radiation of the pain into her left jaw and left shoulder blade. 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 The emergency part and she received nitroglycerin which improved her chest pain, however she still "did not feel right". Currently the patient is chest pain-free and without any other symptoms except for a mild headache.  She has family history of myocardial infarction: Her younger brother had a massive MI at age 62. Her father mother also have a history of MI, age uncertain  Inpatient Medications  . aliskiren  300 mg Oral Daily   And  . hydrochlorothiazide  25 mg Oral Daily  . aspirin EC  325 mg Oral Daily  . atorvastatin  10 mg Oral q1800  . enoxaparin (LOVENOX) injection  40 mg Subcutaneous Daily  . potassium chloride  40 mEq Oral Once    Physical Exam  Blood pressure 121/54, pulse 88, temperature 98.2 F (36.8 C), temperature source Oral, resp. rate 18, height 5\' 4"  (1.626 m), weight 286 lb 8 oz (129.956 kg), SpO2 94 %.  General: Pleasant, NAD Psych: Normal affect. Neuro: Alert and oriented X 3. Moves all extremities spontaneously. HEENT: Normal  Neck: Supple without bruits or JVD. Lungs:  Resp regular and unlabored, CTA. Heart: RRR no s3, s4, or murmurs. Abdomen: Soft, non-tender, non-distended, BS + x 4.  Extremities: No clubbing, cyanosis or edema. DP/PT/Radials 2+ and equal bilaterally.  Labs   Recent Labs  06/18/15 1911 06/19/15 0155 06/19/15 0900  06/19/15 1530  TROPONINI <0.03 <0.03 0.03 <0.03   Lab Results  Component Value Date   WBC 10.1 06/20/2015   HGB 13.1 06/20/2015   HCT 39.4 06/20/2015   MCV 90.0 06/20/2015   PLT 266 06/20/2015    Recent Labs Lab 06/18/15 1911  06/20/15 0348  NA 139  --  139  K 3.1*  --  3.5  CL 99*  --  104  CO2 30  --  25  BUN 16  --  13  CREATININE 0.90  < > 0.87  CALCIUM 9.5  --  8.6*  PROT 7.8  --   --   BILITOT 0.5  --   --   ALKPHOS 67  --   --   ALT 20  --   --   AST 27  --   --   GLUCOSE 101*  --  111*  < > = values in this interval not displayed. Lab Results  Component Value Date   CHOL 210* 02/12/2014   HDL 62 02/12/2014   LDLCALC 114* 02/12/2014   TRIG 168* 02/12/2014   Radiology/Studies  Dg Chest 2 View  06/18/2015   CLINICAL DATA:  Patient with cough and central chest pain.  EXAM: CHEST  2 VIEW  COMPARISON:  Chest radiograph 12/18/2014  FINDINGS: Monitoring leads overlie the patient. Stable enlarged cardiac and mediastinal contours. No consolidative pulmonary opacities. Probable basilar atelectasis. No pleural effusion or pneumothorax. Anterior cervical spinal fusion hardware.  IMPRESSION: No acute cardiopulmonary  process.  Probable basilar atelectasis.   Electronically Signed   By: Lovey Newcomer M.D.   On: 06/18/2015 19:48   Echocardiogram - 06/19/2015 Study Conclusions  - Left ventricle: The cavity size was normal. Wall thickness was normal. Systolic function was normal. The estimated ejection fraction was in the range of 60% to 65%. Wall motion was normal; there were no regional wall motion abnormalities. Doppler parameters are consistent with abnormal left ventricular relaxation (grade 1 diastolic dysfunction). - Pulmonary arteries: PA peak pressure: 31 mm Hg (S).  Impressions:  - Normal LV systolic function; grade 1 diastolic dysfunction.  ECG: SR, low voltage ECG, otherwise normal , unchanged from 2013    ASSESSMENT AND PLAN  1. Typical  retrosternal chest pain - risk factors include FH of CAD, HTN, obesity, ECG unchanged from prior, we will schedule a [employer], since she is too heavy for 1 day test, if normal discharge today. Echo showed normal LVEF< grade 1 diastolic dysfunction, normal RVSP.   2. HTN - controlled  3. HLP - start low dose atorvastatin 10 mg po daily   Signed, Dorothy Spark, MD, [facility] 06/20/2015, 11:52 AM

## 2015-06-20 NOTE — Discharge Summary (Signed)
Physician Discharge Summary  Alison Richards IRC:[id number] DOB: [birth year] DOA: 06/18/2015  PCP: Beatrice Lecher, MD  Admit date: 06/18/2015 Discharge date: 06/20/2015  Time spent: 40 minutes  Recommendations for Outpatient Follow-up:  1. Follow-up with primary care physician.  Discharge Diagnoses:  Active Problems:   Chest pain   Essential hypertension   Hyperlipidemia   Pain in the chest   Lower extremity pain, left   Discharge Condition: Table  Diet recommendation: Heart healthy  Filed Weights   06/18/15 1858 06/18/15 2222 06/20/15 0500  Weight: 127.007 kg (280 lb) 131.316 kg (289 lb 8 oz) 129.956 kg (286 lb 8 oz)    History of present illness:  Alison Richards is a 64 y.o. female  History of hypertension, asthma/COPD, sleep apnea with C Pap use, GERD, arthritis grade patient presented to the emergency department with onset of chest pain approximately 3:30. She had concomitant headedness, nausea, with radiation of the pain into her left jaw and left shoulder blade. 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 The emergency part and she received nitroglycerin which improved her chest pain, however she still "did not feel right". Currently the patient is chest pain-free and without any other symptoms except for a mild headache.  She has family history of myocardial infarction: Her younger brother had a massive MI at age 67. Her father mother also have a history of MI, age uncertain.  Hospital Course:   Chest pain - heart score of 5  Admitted to telemetry for observation, cardiology consulted.  Twelve-lead EKG and 3 sets of cardiac enzymes were negative.  2-D echocardiogram showed normal LV EF of 63% with grade 1 diastolic dysfunction.  Stress test done and showed LVEF of 70%, low risk study.  Chest pain is likely muscular skeletal in nature, follow-up with  primary care physician as outpatient.  Hypertension  Continue blood pressure medications.  LDL is 114, cardiology recommended 10 mg of Lipitor nightly.  Objective sleep apnea  CPAP  Left lower extremity pain  This is happening tonight, mentioned left lower extremity mild pain, no swelling, no edema or warmth to suggest cellulitis.  Doppler showed no evidence of DVT  Hypokalemia  Repleted with oral supplements.   Procedures:  Stress test, results below.  Left lower extremity Doppler showed no evidence of DVT (preliminary results).  Consultations:  None  Discharge Exam: Filed Vitals:   06/20/15 0500  BP: 121/54  Pulse: 88  Temp: 98.2 F (36.8 C)  Resp: 18   General: Alert and awake, oriented x3, not in any acute distress. HEENT: anicteric sclera, pupils reactive to light and accommodation, EOMI CVS: S1-S2 clear, no murmur rubs or gallops Chest: clear to auscultation bilaterally, no wheezing, rales or rhonchi Abdomen: soft nontender, nondistended, normal bowel sounds, no organomegaly Extremities: no cyanosis, clubbing or edema noted bilaterally Neuro: Cranial nerves II-XII intact, no focal neurological deficits  Discharge Instructions   Discharge Instructions    Diet - low sodium heart healthy    Complete by:  As directed      Diet - low sodium heart healthy    Complete by:  As directed      Increase activity slowly    Complete by:  As directed      Increase activity slowly    Complete by:  As directed           Current Discharge Medication List  START taking these medications   Details  atorvastatin (LIPITOR) 10 MG tablet Take 1 tablet (10 mg total) by mouth daily at 6 PM. Qty: 30 tablet, Refills: 0      CONTINUE these medications which have NOT CHANGED   Details  albuterol (PROVENTIL HFA;VENTOLIN HFA) 108 (90 BASE) MCG/ACT inhaler Inhale 2 puffs into the lungs every 6 (six) hours as needed for wheezing. Qty: 1 Inhaler, Refills: 1     Aliskiren-Hydrochlorothiazide (TEKTURNA HCT) 300-12.5 MG TABS Take 1 tablet by mouth daily before breakfast.     AMBULATORY NON FORMULARY MEDICATION CPAP Machine Uses at bedtime as directed    budesonide-formoterol (SYMBICORT) 160-4.5 MCG/ACT inhaler Inhale 2 puffs into the lungs 2 (two) times daily. Qty: 1 Inhaler, Refills: 3    cetirizine (ZYRTEC) 10 MG tablet Take 10 mg by mouth daily as needed for allergies.    hydrochlorothiazide (HYDRODIURIL) 25 MG tablet Take 12.5 mg by mouth daily.    ibuprofen (ADVIL,MOTRIN) 200 MG tablet Take 400 mg by mouth at bedtime as needed for moderate pain.    loratadine (CLARITIN) 10 MG tablet Take 10 mg by mouth daily as needed for allergies.    Menthol, Topical Analgesic, (BIOFREEZE EX) Apply 1 application topically daily as needed (for pain).    montelukast (SINGULAIR) 10 MG tablet Take 1 tablet (10 mg total) by mouth at bedtime. Qty: 30 tablet, Refills: 5    naproxen sodium (ANAPROX) 220 MG tablet Take 220-440 mg by mouth 2 (two) times daily as needed (for pain).    polyethylene glycol powder (GLYCOLAX/MIRALAX) powder Take 17 g by mouth daily as needed for mild constipation.     benzonatate (TESSALON) 200 MG capsule Take 1 capsule (200 mg total) by mouth at bedtime. Take as needed for cough Qty: 12 capsule, Refills: 0       Allergies  Allergen Reactions  . Dilaudid [Hydromorphone Hcl] Anaphylaxis and Swelling    Throat closing up  . Hydromorphone Anaphylaxis and Shortness Of Breath    Pt had a tightening in her throat  . Acetaminophen Other (See Comments)    hallucinations, cold sweats, headache, metallic taste, tongue swelling  . Angiotensin Receptor Blockers Other (See Comments)    Joint aches  . Erythromycin Ethylsuccinate Diarrhea    gi symptoms, stomach swelling  . Lisinopril Other (See Comments)    ASTHMA LIKE SYMPTOMS, coughing  . Losartan Potassium-Hctz     REACTION: JOINTS aching, hurting all over  . Morphine Sulfate  Nausea And Vomiting    Projectile vomiting  . Valsartan     REACTION: Hurting all over  . Latex Itching and Rash    SENSITIVE TO SOME ADHESIVES.  . Sulfa Antibiotics Swelling and Rash    Eyes swelled shut  . Sulfonamide Derivatives Swelling and Rash  . Tape Rash    Red and bruising, Please use "paper" tape only   Follow-up Information    Follow up with METHENEY,CATHERINE, MD In 1 week.   Specialty:  Family Medicine   Contact information:   [address] [place] [address] [phone]        The results of significant diagnostics from this hospitalization (including imaging, microbiology, ancillary and laboratory) are listed below for reference.    Significant Diagnostic Studies: Dg Chest 2 View  06/18/2015   CLINICAL DATA:  Patient with cough and central chest pain.  EXAM: CHEST  2 VIEW  COMPARISON:  Chest radiograph 12/18/2014  FINDINGS: Monitoring leads overlie the patient.  Stable enlarged cardiac and mediastinal contours. No consolidative pulmonary opacities. Probable basilar atelectasis. No pleural effusion or pneumothorax. Anterior cervical spinal fusion hardware.  IMPRESSION: No acute cardiopulmonary process.  Probable basilar atelectasis.   Electronically Signed   By: Lovey Newcomer M.D.   On: 06/18/2015 19:48   Nm Myocar Multi W/spect W/wall Motion / Ef  06/20/2015   CLINICAL DATA:  64 year old who presented to the emergency department 2 days ago with acute onset of chest pain radiating into the left jawline and left shoulder, improved with nitroglycerin. Current history of hypertension, COPD/asthma, and sleep apnea requiring CPAP.  EXAM: MYOCARDIAL IMAGING WITH SPECT (REST AND PHARMACOLOGIC-STRESS - 2 DAY PROTOCOL)  GATED LEFT VENTRICULAR WALL MOTION STUDY  LEFT VENTRICULAR EJECTION FRACTION  TECHNIQUE: Standard myocardial SPECT imaging was performed after resting intravenous injection of 31.4 mCi Tc-33m sestamibi. On a different day, intravenous infusion of  Lexiscan was performed under the supervision of the Cardiology staff. At peak effect of the drug, 32.8 mCi Tc-19m sestamibi was injected intravenously and standard myocardial SPECT imaging was performed. Quantitative gated imaging was also performed to evaluate left ventricular wall motion, and estimate left ventricular ejection fraction.  COMPARISON:  None.  FINDINGS: Perfusion: Slight diminished activity in the anteroapical wall which can be explained on the basis of breast attenuation. No significant perfusion abnormality. No evidence of reversibility.  Wall Motion: Normal left ventricular wall motion. No left ventricular dilation.  Left Ventricular Ejection Fraction: 27%  End diastolic volume 84 ml  End systolic volume 25 ml  IMPRESSION: 1. No reversible ischemia or infarction.  2. Normal left ventricular wall motion.  3. Left ventricular ejection fraction 70%  4. Low-risk stress test findings*.  *2012 Appropriate Use Criteria for Coronary Revascularization Focused Update: J Am Coll Cardiol. 7824;23(5):361-443. http://content.airportbarriers.com.aspx?articleid=1201161   Electronically Signed   By: Evangeline Dakin M.D.   On: 06/20/2015 12:13    Microbiology: No results found for this or any previous visit (from the past 240 hour(s)).   Labs: Basic Metabolic Panel:  Recent Labs Lab 06/18/15 1911 06/19/15 0155 06/20/15 0348  NA 139  --  139  K 3.1*  --  3.5  CL 99*  --  104  CO2 30  --  25  GLUCOSE 101*  --  111*  BUN 16  --  13  CREATININE 0.90 0.87 0.87  CALCIUM 9.5  --  8.6*   Liver Function Tests:  Recent Labs Lab 06/18/15 1911  AST 27  ALT 20  ALKPHOS 67  BILITOT 0.5  PROT 7.8  ALBUMIN 4.0   No results for input(s): LIPASE, AMYLASE in the last 168 hours. No results for input(s): AMMONIA in the last 168 hours. CBC:  Recent Labs Lab 06/18/15 1911 06/19/15 0155 06/20/15 0348  WBC 14.1* 12.6* 10.1  HGB 14.2 13.3 13.1  HCT 42.9 38.1 39.4  MCV 89.7 88.6 90.0  PLT  307 275 266   Cardiac Enzymes:  Recent Labs Lab 06/18/15 1911 06/19/15 0155 06/19/15 0900 06/19/15 1530  TROPONINI <0.03 <0.03 0.03 <0.03   BNP: BNP (last 3 results)  Recent Labs  06/18/15 1929  BNP 15.0    ProBNP (last 3 results) No results for input(s): PROBNP in the last 8760 hours.  CBG: No results for input(s): GLUCAP in the last 168 hours.     Signed:  Xena Propst A  [facility] 06/20/2015, 12:31 PM

## 2015-06-28 ENCOUNTER — Ambulatory Visit (INDEPENDENT_AMBULATORY_CARE_PROVIDER_SITE_OTHER): Payer: 59 | Admitting: Osteopathic Medicine

## 2015-06-28 ENCOUNTER — Ambulatory Visit (INDEPENDENT_AMBULATORY_CARE_PROVIDER_SITE_OTHER): Payer: 59

## 2015-06-28 ENCOUNTER — Encounter: Payer: Self-pay | Admitting: Osteopathic Medicine

## 2015-06-28 VITALS — BP 130/73 | HR 88 | Wt 291.0 lb

## 2015-06-28 DIAGNOSIS — J4541 Moderate persistent asthma with (acute) exacerbation: Secondary | ICD-10-CM | POA: Diagnosis not present

## 2015-06-28 DIAGNOSIS — R0602 Shortness of breath: Secondary | ICD-10-CM

## 2015-06-28 DIAGNOSIS — J9811 Atelectasis: Secondary | ICD-10-CM

## 2015-06-28 DIAGNOSIS — R059 Cough, unspecified: Secondary | ICD-10-CM

## 2015-06-28 DIAGNOSIS — R05 Cough: Secondary | ICD-10-CM | POA: Diagnosis not present

## 2015-06-28 MED ORDER — MONTELUKAST SODIUM 10 MG PO TABS: 10.0000 mg | ORAL_TABLET | Freq: Every day | ORAL | Status: DC

## 2015-06-28 MED ORDER — PREDNISONE 10 MG (21) PO TBPK: 20.0000 mg | ORAL_TABLET | Freq: Every day | ORAL | Status: DC

## 2015-06-28 MED ORDER — BENZONATATE 200 MG PO CAPS: 200.0000 mg | ORAL_CAPSULE | Freq: Three times a day (TID) | ORAL | Status: DC | PRN

## 2015-06-28 MED ORDER — BENZONATATE 200 MG PO CAPS: 200.0000 mg | ORAL_CAPSULE | Freq: Every day | ORAL | Status: DC

## 2015-06-28 NOTE — Progress Notes (Signed)
HPI: Alison Richards is a 64 y.o. female who presents to [place]  today for chief complaint of: cough and recent hospitalization   Recently hospitalized for chest pain rule out discharged 8 days ago. Presents today for acute visit for cough. Had mild nagging cough on day of discharge the hospital but seems to have gotten worse, nonproductive. She has a history of asthma but no tobacco use or COPD. Since being out of the hospital she has been taking DULERA daily, over the past few days she has had to use her albuterol inhaler more frequently, this has helped some. No fever or chills, no exercise tolerance changes, no chest pain, no rhinorrhea, sinus pressure, or sore throat.  Discharge summary reviewed from 06/20/2015, workup for chest pain with negative, stress test was normal, diagnosis chest pain likely musculoskeletal in nature, patient not sure she was supposed to follow up with cardiology the discharge summary recommends following up with PCP no mention of cardiology referral   Past medical, social and family history reviewed: Past Medical History  Diagnosis Date  . Palpitations   . Orthostatic lightheadedness   . Pain in joint   . Hypertension   . Asthma     PAST HX OF ASTHMA WHILE TAKING LISINOPRIL--NO PROBLEMS SINCE DISCONTINUING LISINOPRIL  . Shortness of breath     WITH EXERTION-PT RELATES TO HER WEIGHT  . Obstructive sleep apnea     CPAP EVERY NIGHT-SETTING IS 8  . Blood transfusion     AGE 64 -HEMORRHAGED AFTER TONSILLECTOMY  . Protein in urine   . GERD (gastroesophageal reflux disease)     PAST HX  H-PYLORI   . H/O hiatal hernia   . Arthritis     PAIN AND OA RIGHT KNEE  . PONV (postoperative nausea and vomiting)   . Obesity   . H. pylori infection     Hx of   . Hyperlipemia   . Kidney stones   . Wears dentures     full top  . Wears glasses    Past Surgical History  Procedure Laterality Date  . Bladder surgery    .  Tubal ligation  1979  . Cesarean section  1979  . Abdominal hysterectomy  1999  . Neck sx for ruptured disc c4-5  2001  . Tonsillectomy  age 54  . Cholecystectomy  3-08    Dr Lennie Hummer  . Left knee replacement  2008  . Total knee arthroplasty  11/14/2011    Procedure: TOTAL KNEE ARTHROPLASTY;  Surgeon: Mauri Pole, MD;  Location: [facility];  Service: Orthopedics;  Laterality: Right;  combined with general block  . Hand surgery      Bilateral   . Esophageal manometry  09/02/2012    Procedure: ESOPHAGEAL MANOMETRY (EM);  Surgeon: Sable Feil, MD;  Location: [facility];  Service: Endoscopy;  Laterality: N/A;  . Colonoscopy    . Trigger finger release Right 12/25/2013    Procedure: RIGHT LONG TRIGGER RELEASE ;  Surgeon: Tennis Must, MD;  Location: [place];  Service: Orthopedics;  Laterality: Right;   Social History  Substance Use Topics  . Smoking status: Never Smoker   . Smokeless tobacco: Never Used  . Alcohol Use: No   Family History  Problem Relation Age of Onset  . Alzheimer's disease Mother 2  . Hypertension Mother   . Hyperlipidemia Mother   . Alcohol abuse Father   . Cancer Father   .  COPD Father   . Colon cancer Neg Hx   . Lung cancer Father   . Diverticulitis Mother   . Skin cancer Father   . Prostate cancer Father     Current Outpatient Prescriptions  Medication Sig Dispense Refill  . albuterol (PROVENTIL HFA;VENTOLIN HFA) 108 (90 BASE) MCG/ACT inhaler Inhale 2 puffs into the lungs every 6 (six) hours as needed for wheezing. 1 Inhaler 1  . Aliskiren-Hydrochlorothiazide (TEKTURNA HCT) 300-12.5 MG TABS Take 1 tablet by mouth daily before breakfast.     . AMBULATORY NON FORMULARY MEDICATION CPAP Machine Uses at bedtime as directed    . atorvastatin (LIPITOR) 10 MG tablet Take 1 tablet (10 mg total) by mouth daily at 6 PM. 30 tablet 0  .      .    3  . cetirizine (ZYRTEC) 10 MG tablet Take 10 mg by mouth daily as needed for allergies.    .  hydrochlorothiazide (HYDRODIURIL) 25 MG tablet Take 12.5 mg by mouth daily.    Marland Kitchen ibuprofen (ADVIL,MOTRIN) 200 MG tablet Take 400 mg by mouth at bedtime as needed for moderate pain.    Marland Kitchen loratadine (CLARITIN) 10 MG tablet Take 10 mg by mouth daily as needed for allergies.    . Menthol, Topical Analgesic, (BIOFREEZE EX) Apply 1 application topically daily as needed (for pain).    . montelukast (SINGULAIR) 10 MG tablet Take 1 tablet (10 mg total) by mouth at bedtime. (Patient taking differently: Take 10 mg by mouth at bedtime as needed (for asthma symptoms). ) 30 tablet 5  . naproxen sodium (ANAPROX) 220 MG tablet Take 220-440 mg by mouth 2 (two) times daily as needed (for pain).    . polyethylene glycol powder (GLYCOLAX/MIRALAX) powder Take 17 g by mouth daily as needed for mild constipation.     .       No current facility-administered medications for this visit.   Allergies  Allergen Reactions  . Dilaudid [Hydromorphone Hcl] Anaphylaxis and Swelling    Throat closing up  . Hydromorphone Anaphylaxis and Shortness Of Breath    Pt had a tightening in her throat  . Acetaminophen Other (See Comments)    hallucinations, cold sweats, headache, metallic taste, tongue swelling  . Angiotensin Receptor Blockers Other (See Comments)    Joint aches  . Erythromycin Ethylsuccinate Diarrhea    gi symptoms, stomach swelling  . Lisinopril Other (See Comments)    ASTHMA LIKE SYMPTOMS, coughing  . Losartan Potassium-Hctz     REACTION: JOINTS aching, hurting all over  . Morphine Sulfate Nausea And Vomiting    Projectile vomiting  . Valsartan     REACTION: Hurting all over  . Latex Itching and Rash    SENSITIVE TO SOME ADHESIVES.  . Sulfa Antibiotics Swelling and Rash    Eyes swelled shut  . Sulfonamide Derivatives Swelling and Rash  . Tape Rash    Red and bruising, Please use "paper" tape only     Review of Systems: CONSTITUTIONAL: Neg fever/chills HEAD/EYES/EARS/NOSE/THROAT: No headache/vision  change or hearing change, no sore throat CARDIAC: No chest pain/pressure/palpitations, no orthopnea RESPIRATORY: (+) cough as per HPI, (+) SOB as pre HPI GASTROINTESTINAL: No nausea/vomiting/abdominal pain MUSCULOSKELETAL: No myalgia/arthralgia SKIN: No rash/wounds/concerning lesions  Exam:  BP 130/73 mmHg  Pulse 88  Wt 291 lb (131.997 kg)  SpO2 96% Constitutional: VSS, see above. General Appearance: alert, well-developed, well-nourished, NAD Eyes: Normal lids and conjunctive, non-icteric sclera, PERRLA Ears, Nose, Mouth, Throat: Normal external inspection ears/nares/mouth/lips/gums, MMM,  posterior pharynx without erythema/exudate Neck: No masses, trachea midline. No thyroid enlargement/tenderness/mass appreciated Respiratory: Normal respiratory effort. No dullness/hyper-resonance to percussion. Breath sounds normal, no wheeze/rhonchi/rales (note: patent states took last Albuterol 2 hours ago) Cardiovascular: S1/S2 normal, no murmur/rub/gallop auscultated. No carotid bruit or JVD. No abdominal aortic bruit. Pedal pulse II/IV bilaterally DP and PT. No lower extremity edema. Musculoskeletal: Gait normal. No clubbing/cyanosis of digits.  No results found for this or any previous visit (from the past 72 hour(s)).    ASSESSMENT/PLAN:  Cough - unlikely pneumonia based on exam/symptoms/XR, most likely asthma exacerbation versus viral bronchitis, will Rx abx if (+)CXR - Plan: DG Chest 2 View  SOB (shortness of breath) - SaO2 96%, (+)Asthma, No Hx COPD, nonproductive cough.  - Plan: DG Chest 2 View   Asthma with acute exacerbation, moderate persistent - Continue Dulera but increase to BID, continue Albuterol q4h prn, ER precautions reviewed.    Suspicion for asthma exacerbation versus viral respiratory infection, however given that the patient was recently hospitalized chest x-ray was obtained, on personal review I see no infiltrate concerning for pneumonia, no atelectasis or effusion, cardiac  size normal, skeletal structures appear normal, will await overread., We'll treat as asthma exacerbation, increased Dulera use to twice a day, continue albuterol as needed, steroid taper ordered, patient should be taking her Singulair regularly. Follow up in office if not getting any better in the next few days, ER precautions reviewed with regard to difficulty breathing, fever or chills, or other concerns. Patient follow-up with Dr. Suzi Roots for routine care.   With regard to hospital follow-up, workup was negative for acute coronary syndrome, do not see a necessity of cardiology referral at this time, ER precautions reviewed with regard to chest pain.

## 2015-06-29 MED ORDER — PREDNISONE 10 MG (21) PO TBPK: ORAL_TABLET | ORAL | Status: DC

## 2015-06-29 NOTE — Addendum Note (Signed)
Addended by: Maryla Morrow on: 06/29/2015 12:12 PM   Modules accepted: Orders

## 2015-07-01 ENCOUNTER — Telehealth: Payer: Self-pay | Admitting: Family Medicine

## 2015-07-01 DIAGNOSIS — J45901 Unspecified asthma with (acute) exacerbation: Secondary | ICD-10-CM

## 2015-07-01 MED ORDER — PREDNISONE 20 MG PO TABS: ORAL_TABLET | ORAL | Status: AC

## 2015-07-01 MED ORDER — DOXYCYCLINE HYCLATE 100 MG PO TABS
100.0000 mg | ORAL_TABLET | Freq: Two times a day (BID) | ORAL | Status: DC
Start: 2015-07-01 — End: 2015-09-30

## 2015-07-01 NOTE — Telephone Encounter (Signed)
Pt.notified

## 2015-07-01 NOTE — Telephone Encounter (Signed)
[name], Will you please call patient and let her know that I've sent a Rx for doxycyline, an antibiotic, and a different taper regimen for prednisone.  Whatever tablets she has now she should hold onto and use them for her husband once he restarts his daily 10mg  regimen. I must emphasize only to take the prednisone I've sent in today, not any that was Rxed earlier this week by Dr. Sheppard Coil.     I was talking to this patient on the phone today while examining her husband today during office visit. Sounds like she is worsening from a respiratory standpoint with respect to shortness of breath. She tells me that there is a great deal confusion on how she was supposed to take her prednisone taper prescribed earlier this week by one my partners. I will simple 5 the situation with a new prednisone taper with more specific  Instructions along with an antibiotic.

## 2015-08-25 ENCOUNTER — Telehealth: Payer: Self-pay | Admitting: *Deleted

## 2015-08-25 MED ORDER — AMBULATORY NON FORMULARY MEDICATION: Status: DC

## 2015-08-25 NOTE — Telephone Encounter (Signed)
Pt called and stated that her cpap machine tubing will need to be replaced. She will need a rx for this. Printed and placed up front.Alison Richards'

## 2015-09-30 ENCOUNTER — Encounter: Payer: Self-pay | Admitting: Family Medicine

## 2015-09-30 ENCOUNTER — Ambulatory Visit (INDEPENDENT_AMBULATORY_CARE_PROVIDER_SITE_OTHER): Payer: 59 | Admitting: Family Medicine

## 2015-09-30 VITALS — BP 165/67 | HR 94 | Temp 98.9°F | Wt 292.0 lb

## 2015-09-30 DIAGNOSIS — J45901 Unspecified asthma with (acute) exacerbation: Secondary | ICD-10-CM

## 2015-09-30 DIAGNOSIS — J209 Acute bronchitis, unspecified: Secondary | ICD-10-CM

## 2015-09-30 MED ORDER — GUAIFENESIN-CODEINE 100-10 MG/5ML PO SOLN: 5.0000 mL | Freq: Every evening | ORAL | Status: DC | PRN

## 2015-09-30 MED ORDER — AZITHROMYCIN 250 MG PO TABS
250.0000 mg | ORAL_TABLET | Freq: Every day | ORAL | Status: DC
Start: 2015-09-30 — End: 2015-12-30

## 2015-09-30 MED ORDER — IPRATROPIUM BROMIDE 0.06 % NA SOLN: 2.0000 | Freq: Four times a day (QID) | NASAL | Status: DC

## 2015-09-30 MED ORDER — PREDNISONE 10 MG PO TABS: 30.0000 mg | ORAL_TABLET | Freq: Every day | ORAL | Status: DC

## 2015-09-30 MED ORDER — ALBUTEROL SULFATE HFA 108 (90 BASE) MCG/ACT IN AERS: 2.0000 | INHALATION_SPRAY | Freq: Four times a day (QID) | RESPIRATORY_TRACT | Status: DC | PRN

## 2015-09-30 MED ORDER — MOMETASONE FURO-FORMOTEROL FUM 200-5 MCG/ACT IN AERO: 2.0000 | INHALATION_SPRAY | Freq: Every day | RESPIRATORY_TRACT | Status: DC

## 2015-09-30 NOTE — Assessment & Plan Note (Addendum)
Likely given viral URI. Very mild given presentation on exam, though took albuterol 30 minutes prior. Recommend oral prednisone as well as daily inhaled corticosteroids for maintenance. Discussed symptoms warranting going to the hospital including wheezing, cough, chest tightness and/or dyspnea not responding to rescue albuterol.

## 2015-09-30 NOTE — Assessment & Plan Note (Signed)
Likely given timeline of symptoms, presence of wheezing. Recommend Atrovent for rhinorrhea and codeine cough syrup for cough. Will follow up if symptoms last 10 days.

## 2015-09-30 NOTE — Patient Instructions (Addendum)
Thank you for coming in today. Recommend Atrovent for runny nose, over the counter decongestant for congestion and saline nasal spray as well. We recommend 1 gram of Vit C daily as well as Zinc as a vitamin supplement as this can help decrease symptoms.  Call or go to the emergency room if you get worse, have trouble breathing, have chest pains, or palpitations.  Take azithromycin antibiotic if not better.   Acute Bronchitis Bronchitis is inflammation of the airways that extend from the windpipe into the lungs (bronchi). The inflammation often causes mucus to develop. This leads to a cough, which is the most common symptom of bronchitis.  In acute bronchitis, the condition usually develops suddenly and goes away over time, usually in a couple weeks. Smoking, allergies, and asthma can make bronchitis worse. Repeated episodes of bronchitis may cause further lung problems.  CAUSES Acute bronchitis is most often caused by the same virus that causes a cold. The virus can spread from person to person (contagious) through coughing, sneezing, and touching contaminated objects. SIGNS AND SYMPTOMS   Cough.   Fever.   Coughing up mucus.   Body aches.   Chest congestion.   Chills.   Shortness of breath.   Sore throat.  DIAGNOSIS  Acute bronchitis is usually diagnosed through a physical exam. Your health care provider will also ask you questions about your medical history. Tests, such as chest X-rays, are sometimes done to rule out other conditions.  TREATMENT  Acute bronchitis usually goes away in a couple weeks. Oftentimes, no medical treatment is necessary. Medicines are sometimes given for relief of fever or cough. Antibiotic medicines are usually not needed but may be prescribed in certain situations. In some cases, an inhaler may be recommended to help reduce shortness of breath and control the cough. A cool mist vaporizer may also be used to help thin bronchial secretions and make it  easier to clear the chest.  HOME CARE INSTRUCTIONS  Get plenty of rest.   Drink enough fluids to keep your urine clear or pale yellow (unless you have a medical condition that requires fluid restriction). Increasing fluids may help thin your respiratory secretions (sputum) and reduce chest congestion, and it will prevent dehydration.   Take medicines only as directed by your health care provider.  If you were prescribed an antibiotic medicine, finish it all even if you start to feel better.  Avoid smoking and secondhand smoke. Exposure to cigarette smoke or irritating chemicals will make bronchitis worse. If you are a smoker, consider using nicotine gum or skin patches to help control withdrawal symptoms. Quitting smoking will help your lungs heal faster.   Reduce the chances of another bout of acute bronchitis by washing your hands frequently, avoiding people with cold symptoms, and trying not to touch your hands to your mouth, nose, or eyes.   Keep all follow-up visits as directed by your health care provider.  SEEK MEDICAL CARE IF: Your symptoms do not improve after 1 week of treatment.  SEEK IMMEDIATE MEDICAL CARE IF:  You develop an increased fever or chills.   You have chest pain.   You have severe shortness of breath.  You have bloody sputum.   You develop dehydration.  You faint or repeatedly feel like you are going to pass out.  You develop repeated vomiting.  You develop a severe headache. MAKE SURE YOU:   Understand these instructions.  Will watch your condition.  Will get help right away if you are  not doing well or get worse.   This information is not intended to replace advice given to you by your health care provider. Make sure you discuss any questions you have with your health care provider.   Document Released: 11/23/2004 Document Revised: 11/06/2014 Document Reviewed: 04/08/2013 Elsevier Interactive Patient Education [employer].

## 2015-09-30 NOTE — Progress Notes (Signed)
Alison Richards is a 64 y.o. female who presents to [facility]  today for cough, fever and congestion.  Patient first noted sore throat Monday which resolved Tuesday when she had an onset of persistent non productive cough, nasal congestion and rhinorrhea with yellow serous discharge. She complains of headache and pressure. She noted wheezing yesterday and today, resolved with her albuterol. She has had fever to 100 yesterday and today. She has taken OTC decongestants which helps a little. She denies chest congestion, chest pain, dyspnea.   Patient notes she has not been taking Dulera as she normally takes this daily but seasonally for maintenance in the summer for worsened asthma symptoms.   Past Medical History  Diagnosis Date  . Palpitations   . Orthostatic lightheadedness   . Pain in joint   . Hypertension   . Asthma     PAST HX OF ASTHMA WHILE TAKING LISINOPRIL--NO PROBLEMS SINCE DISCONTINUING LISINOPRIL  . Shortness of breath     WITH EXERTION-PT RELATES TO HER WEIGHT  . Obstructive sleep apnea     CPAP EVERY NIGHT-SETTING IS 8  . Blood transfusion     AGE 41 -HEMORRHAGED AFTER TONSILLECTOMY  . Protein in urine   . GERD (gastroesophageal reflux disease)     PAST HX  H-PYLORI   . H/O hiatal hernia   . Arthritis     PAIN AND OA RIGHT KNEE  . PONV (postoperative nausea and vomiting)   . Obesity   . H. pylori infection     Hx of   . Hyperlipemia   . Kidney stones   . Wears dentures     full top  . Wears glasses    Past Surgical History  Procedure Laterality Date  . Bladder surgery    . Tubal ligation  1979  . Cesarean section  1979  . Abdominal hysterectomy  1999  . Neck sx for ruptured disc c4-5  2001  . Tonsillectomy  age 26  . Cholecystectomy  3-08    Dr Lennie Hummer  . Left knee replacement  2008  . Total knee arthroplasty  11/14/2011    Procedure: TOTAL KNEE ARTHROPLASTY;  Surgeon: Mauri Pole, MD;  Location: [facility];   Service: Orthopedics;  Laterality: Right;  combined with general block  . Hand surgery      Bilateral   . Esophageal manometry  09/02/2012    Procedure: ESOPHAGEAL MANOMETRY (EM);  Surgeon: Sable Feil, MD;  Location: [facility];  Service: Endoscopy;  Laterality: N/A;  . Colonoscopy    . Trigger finger release Right 12/25/2013    Procedure: RIGHT LONG TRIGGER RELEASE ;  Surgeon: Tennis Must, MD;  Location: [place];  Service: Orthopedics;  Laterality: Right;   Social History  Substance Use Topics  . Smoking status: Never Smoker   . Smokeless tobacco: Never Used  . Alcohol Use: No   family history includes Alcohol abuse in her father; Alzheimer's disease (age of onset: 23) in her mother; COPD in her father; Cancer in her father; Diverticulitis in her mother; Hyperlipidemia in her mother; Hypertension in her mother; Lung cancer in her father; Prostate cancer in her father; Skin cancer in her father. There is no history of Colon cancer.  ROS as above Medications: Current Outpatient Prescriptions  Medication Sig Dispense Refill  . albuterol (PROVENTIL HFA;VENTOLIN HFA) 108 (90 BASE) MCG/ACT inhaler Inhale 2 puffs into the lungs every 6 (six) hours as needed for  wheezing. 1 Inhaler 1  . Aliskiren-Hydrochlorothiazide (TEKTURNA HCT) 300-12.5 MG TABS Take 1 tablet by mouth daily before breakfast.     . AMBULATORY NON FORMULARY MEDICATION CPAP Machine Uses at bedtime as directed    . AMBULATORY NON FORMULARY MEDICATION Medication Name: CPAP hose replacement Dx: OSA  Dx code: G47.33 1 each 0  . atorvastatin (LIPITOR) 10 MG tablet Take 1 tablet (10 mg total) by mouth daily at 6 PM. 30 tablet 0  . benzonatate (TESSALON) 200 MG capsule Take 1 capsule (200 mg total) by mouth 3 (three) times daily as needed for cough. Take as needed for cough 30 capsule 0  . cetirizine (ZYRTEC) 10 MG tablet Take 10 mg by mouth daily as needed for allergies.    . hydrochlorothiazide  (HYDRODIURIL) 25 MG tablet Take 12.5 mg by mouth daily.    Marland Kitchen loratadine (CLARITIN) 10 MG tablet Take 10 mg by mouth daily as needed for allergies.    . Menthol, Topical Analgesic, (BIOFREEZE EX) Apply 1 application topically daily as needed (for pain).    . mometasone-formoterol (DULERA) 200-5 MCG/ACT AERO Inhale 2 puffs into the lungs daily.    . montelukast (SINGULAIR) 10 MG tablet Take 1 tablet (10 mg total) by mouth at bedtime. 30 tablet 5  . naproxen sodium (ANAPROX) 220 MG tablet Take 220-440 mg by mouth 2 (two) times daily as needed (for pain).    . polyethylene glycol powder (GLYCOLAX/MIRALAX) powder Take 17 g by mouth daily as needed for mild constipation.     Marland Kitchen azithromycin (ZITHROMAX) 250 MG tablet Take 1 tablet (250 mg total) by mouth daily. Take first 2 tablets together, then 1 every day until finished. 6 tablet 0  . guaiFENesin-codeine 100-10 MG/5ML syrup Take 5 mLs by mouth at bedtime as needed for cough. 120 mL 0  . ipratropium (ATROVENT) 0.06 % nasal spray Place 2 sprays into both nostrils 4 (four) times daily. 15 mL 1  . predniSONE (DELTASONE) 10 MG tablet Take 3 tablets (30 mg total) by mouth daily. 15 tablet 0   No current facility-administered medications for this visit.   Allergies  Allergen Reactions  . Dilaudid [Hydromorphone Hcl] Anaphylaxis and Swelling    Throat closing up  . Hydromorphone Anaphylaxis and Shortness Of Breath    Pt had a tightening in her throat  . Acetaminophen Other (See Comments)    hallucinations, cold sweats, headache, metallic taste, tongue swelling  . Angiotensin Receptor Blockers Other (See Comments)    Joint aches  . Erythromycin Ethylsuccinate Diarrhea    gi symptoms, stomach swelling  . Lisinopril Other (See Comments)    ASTHMA LIKE SYMPTOMS, coughing  . Losartan Potassium-Hctz     REACTION: JOINTS aching, hurting all over  . Morphine Sulfate Nausea And Vomiting    Projectile vomiting  . Valsartan     REACTION: Hurting all over   . Latex Itching and Rash    SENSITIVE TO SOME ADHESIVES.  . Sulfa Antibiotics Swelling and Rash    Eyes swelled shut  . Sulfonamide Derivatives Swelling and Rash  . Tape Rash    Red and bruising, Please use "paper" tape only     Exam:  BP 165/67 mmHg  Pulse 94  Temp(Src) 98.9 F (37.2 C) (Oral)  Wt 292 lb (132.45 kg)  SpO2 96% Gen: Ill but non toxic appearing woman seated in  NAD on exam chair.  HEENT: EOMI,  MMM. Erythema on nose as well as bilateral cheeks. Erythematous nasal septa bilaterally  with no active drainage. OP without erythema or exudates.  Lungs: Normal work of breathing. Very mild end expiratory wheezes best appreciated in superior lung fields. Otherwise good air movement without crackles or other adventitious sounds.  Heart: RRR no MRG Abd: NABS, Soft. Nondistended, Nontender Exts: Brisk capillary refill, warm and well perfused.   No results found for this or any previous visit (from the past 24 hour(s)). No results found.   Please see individual assessment and plan sections.

## 2015-10-05 ENCOUNTER — Telehealth: Payer: Self-pay

## 2015-10-05 NOTE — Telephone Encounter (Signed)
PA for [facility] sent through covermymeds.

## 2015-10-06 MED ORDER — FLUTICASONE FUROATE-VILANTEROL 100-25 MCG/INH IN AEPB: 1.0000 | INHALATION_SPRAY | Freq: Every day | RESPIRATORY_TRACT | Status: DC

## 2015-10-06 NOTE — Telephone Encounter (Signed)
Dulera denied. Please choose different medication.

## 2015-10-06 NOTE — Telephone Encounter (Signed)
New rx sent for Breo to try instead.

## 2015-10-06 NOTE — Telephone Encounter (Signed)
I saw this patient for an acute issue and refilled this medicine per pt request. She is Dr M's pt. I will defer to her.

## 2015-10-07 NOTE — Telephone Encounter (Signed)
Left message advising of recommendations.  

## 2015-11-11 LAB — CBC AND DIFFERENTIAL
HCT: 40 % (ref 36–46)
HEMOGLOBIN: 13.7 g/dL (ref 12.0–16.0)
PLATELETS: 303 10*3/uL (ref 150–399)
WBC: 10.4 10*3/mL

## 2015-11-11 LAB — BASIC METABOLIC PANEL
BUN: 21 mg/dL (ref 4–21)
Creatinine: 0.9 mg/dL (ref <=1.1)
Glucose: 93 mg/dL
POTASSIUM: 4.1 mmol/L (ref 3.4–5.3)
Sodium: 137 mmol/L (ref 137–147)

## 2015-11-11 LAB — HEPATIC FUNCTION PANEL
ALK PHOS: 71 U/L (ref 25–125)
ALT: 23 U/L (ref 7–35)
AST: 27 U/L (ref 13–35)
Bilirubin, Total: 0.4 mg/dL

## 2015-12-30 ENCOUNTER — Other Ambulatory Visit: Payer: Self-pay | Admitting: Sports Medicine

## 2015-12-30 ENCOUNTER — Ambulatory Visit (INDEPENDENT_AMBULATORY_CARE_PROVIDER_SITE_OTHER): Payer: 59 | Admitting: Sports Medicine

## 2015-12-30 ENCOUNTER — Encounter: Payer: Self-pay | Admitting: Sports Medicine

## 2015-12-30 ENCOUNTER — Ambulatory Visit (INDEPENDENT_AMBULATORY_CARE_PROVIDER_SITE_OTHER): Payer: 59 | Admitting: Family Medicine

## 2015-12-30 ENCOUNTER — Encounter: Payer: Self-pay | Admitting: Family Medicine

## 2015-12-30 VITALS — BP 142/75 | HR 78 | Wt 293.0 lb

## 2015-12-30 DIAGNOSIS — Z0189 Encounter for other specified special examinations: Secondary | ICD-10-CM

## 2015-12-30 DIAGNOSIS — B3731 Acute candidiasis of vulva and vagina: Secondary | ICD-10-CM

## 2015-12-30 DIAGNOSIS — Z1159 Encounter for screening for other viral diseases: Secondary | ICD-10-CM

## 2015-12-30 DIAGNOSIS — R7301 Impaired fasting glucose: Secondary | ICD-10-CM

## 2015-12-30 DIAGNOSIS — M5416 Radiculopathy, lumbar region: Secondary | ICD-10-CM | POA: Diagnosis not present

## 2015-12-30 DIAGNOSIS — R2232 Localized swelling, mass and lump, left upper limb: Secondary | ICD-10-CM

## 2015-12-30 DIAGNOSIS — M25561 Pain in right knee: Secondary | ICD-10-CM

## 2015-12-30 DIAGNOSIS — Z0000 Encounter for general adult medical examination without abnormal findings: Secondary | ICD-10-CM

## 2015-12-30 DIAGNOSIS — Z13220 Encounter for screening for lipoid disorders: Secondary | ICD-10-CM | POA: Diagnosis not present

## 2015-12-30 DIAGNOSIS — M25562 Pain in left knee: Secondary | ICD-10-CM

## 2015-12-30 DIAGNOSIS — B373 Candidiasis of vulva and vagina: Secondary | ICD-10-CM

## 2015-12-30 MED ORDER — DIAZEPAM 5 MG PO TABS: ORAL_TABLET | ORAL | Status: DC

## 2015-12-30 MED ORDER — FLUCONAZOLE 150 MG PO TABS: 150.0000 mg | ORAL_TABLET | Freq: Once | ORAL | Status: DC

## 2015-12-30 MED ORDER — GABAPENTIN 300 MG PO CAPS: ORAL_CAPSULE | ORAL | Status: DC

## 2015-12-30 MED ORDER — FLUTICASONE FUROATE-VILANTEROL 100-25 MCG/INH IN AEPB: 1.0000 | INHALATION_SPRAY | Freq: Every day | RESPIRATORY_TRACT | Status: DC

## 2015-12-30 NOTE — Progress Notes (Signed)
   Subjective:    I'm seeing this patient as a consultation for:  Dr. Beatrice Lecher  CC: Left leg numbness  HPI: This is a pleasant 65 year old female with known lumbar radiculitis, she's had epidurals, her last one was several years ago, she is now having recurrence of pain and she localizes in the left L5 distribution, without axial pain at all. No bowel or bladder dysfunction, saddle numbness, no constitutional symptoms.  Past medical history, Surgical history, Family history not pertinant except as noted below, Social history, Allergies, and medications have been entered into the medical record, reviewed, and no changes needed.   Review of Systems: No headache, visual changes, nausea, vomiting, diarrhea, constipation, dizziness, abdominal pain, skin rash, fevers, chills, night sweats, weight loss, swollen lymph nodes, body aches, joint swelling, muscle aches, chest pain, shortness of breath, mood changes, visual or auditory hallucinations.   Objective:   General: Well Developed, well nourished, and in no acute distress.  Neuro/Psych: Alert and oriented x3, extra-ocular muscles intact, able to move all 4 extremities, sensation grossly intact. Skin: Warm and dry, no rashes noted.  Respiratory: Not using accessory muscles, speaking in full sentences, trachea midline.  Cardiovascular: Pulses palpable, no extremity edema. Abdomen: Does not appear distended.  MRI shows multilevel disc protrusions with clear contact of the L5 nerve root to the left L5-S1 disc, this MRI was from 2011.  Impression and Recommendations:   This case required medical decision making of moderate complexity.

## 2015-12-30 NOTE — Assessment & Plan Note (Signed)
Known multilevel lumbar degenerative disc disease, symptomatically level does appear to be the L5-S1 level with clear left-sided L5 radicular symptoms. MRI from 2011 shows clear contact of the left extraforaminal disc with the extra foraminal nerve root, L5. We're going to proceed with a new MRI, previous one is 65 years old, and we will likely proceed with a left sided L5-S1 interlaminar epidural.   adding gabapentin for symptomatically relief in the meantime.

## 2015-12-30 NOTE — Progress Notes (Signed)
Subjective:     Alison Richards is a 65 y.o. female and is here for a comprehensive physical exam. The patient reports no problems.  Social History   Social History  . Marital Status: Married    Spouse Name: Herbie Baltimore  . Number of Children: 3  . Years of Education: N/A   Occupational History  . LAB [employer]    Social History Main Topics  . Smoking status: Never Smoker   . Smokeless tobacco: Never Used  . Alcohol Use: No  . Drug Use: No  . Sexual Activity:    Partners: Male   Other Topics Concern  . Not on file   Social History Narrative   Daily caffeine    Health Maintenance  Topic Date Due  . Hepatitis C Screening  [birth year]  . HIV Screening  06/13/1966  . COLONOSCOPY  10/30/2013  . PAP SMEAR  11/24/2014  . INFLUENZA VACCINE  05/30/2016  . MAMMOGRAM  04/06/2017  . TETANUS/TDAP  07/18/2024  . ZOSTAVAX  Completed    The following portions of the patient's history were reviewed and updated as appropriate: allergies, current medications, past family history, past medical history, past social history, past surgical history and problem list.  Review of Systems A comprehensive review of systems was negative.   Objective:    BP 142/75 mmHg  Pulse 78  Wt 293 lb (132.904 kg)  SpO2 99% General appearance: alert, cooperative and appears stated age Head: Normocephalic, without obvious abnormality, atraumatic Eyes: conj clear, EOMI< PEERAL Ears: normal TM's and external ear canals both ears Nose: Nares normal. Septum midline. Mucosa normal. No drainage or sinus tenderness. Throat: lips, mucosa, and tongue normal; teeth and gums normal Neck: no adenopathy, no carotid bruit, no JVD, supple, symmetrical, trachea midline and thyroid not enlarged, symmetric, no tenderness/mass/nodules Back: symmetric, no curvature. ROM normal. No CVA tenderness. Lungs: clear to auscultation bilaterally Breasts: normal appearance, no masses or tenderness Heart: regular rate and rhythm,  S1, S2 normal, no murmur, click, rub or gallop Abdomen: soft, non-tender; bowel sounds normal; no masses,  no organomegaly Extremities: extremities normal, atraumatic, no cyanosis or edema Pulses: 2+ and symmetric Skin: Skin color, texture, turgor normal. No rashes or lesions Lymph nodes: Cervical, supraclavicular, and axillary nodes normal. Neurologic: Alert and oriented X 3, normal strength and tone. Normal symmetric reflexes. Normal coordination and gait    Assessment:    Healthy female exam.      Plan:     See After Visit Summary for Counseling Recommendations   Keep up a regular exercise program and make sure you are eating a healthy diet Try to eat 4 servings of dairy a day, or if you are lactose intolerant take a calcium with vitamin D daily.  Your vaccines are up to date.  She has had a hysterectomy so does not need a Pap smear.  We discussed colon cancer screening.  She also has a very firm mass on her left inner arm just above the antecubital fossa. It's very firm and irregular with a bluish discoloration. Not exactly clear what this is. But I recommend biopsy for further evaluation. She said interestingly she's always had a bruise in that area since being a teenager but more recently after the last year or so it has become very irregular and enlarged and firm.  She also brought in a copy of her blood work today. It included a CMP and CBC as well as a TSH and urinalysis. Overall labs looked good but  she did have a fair amount of yeast in her urine. She does feel like she might have a yeast vaginal yeast infection. Thyroid looks great. We'll prescribe Diflucan.  Impaired fasting glucose-Hemoglobin A1c is mildly elevated at 5.7. Discussed working on diet exercise and weight loss to help control this. Recommend recheck in 6 months.  Did recommend hepatitis C screening and lipid panel.

## 2015-12-30 NOTE — Patient Instructions (Signed)
Please remember to schedule your Pap smear with your OB/GYN.

## 2015-12-31 ENCOUNTER — Observation Stay (HOSPITAL_COMMUNITY): Payer: 59

## 2015-12-31 ENCOUNTER — Emergency Department (HOSPITAL_COMMUNITY): Payer: 59

## 2015-12-31 ENCOUNTER — Observation Stay (HOSPITAL_COMMUNITY)
Admission: EM | Admit: 2015-12-31 | Discharge: 2016-01-02 | Disposition: A | Discharge status: Home / Self Care | Payer: 59 | Attending: Family Medicine | Admitting: Family Medicine

## 2015-12-31 ENCOUNTER — Encounter (HOSPITAL_COMMUNITY): Payer: Self-pay | Admitting: Emergency Medicine

## 2015-12-31 DIAGNOSIS — R4701 Aphasia: Secondary | ICD-10-CM | POA: Diagnosis present

## 2015-12-31 DIAGNOSIS — G629 Polyneuropathy, unspecified: Secondary | ICD-10-CM | POA: Diagnosis not present

## 2015-12-31 DIAGNOSIS — R600 Localized edema: Secondary | ICD-10-CM | POA: Diagnosis not present

## 2015-12-31 DIAGNOSIS — M5416 Radiculopathy, lumbar region: Secondary | ICD-10-CM | POA: Diagnosis present

## 2015-12-31 DIAGNOSIS — R0600 Dyspnea, unspecified: Secondary | ICD-10-CM | POA: Diagnosis present

## 2015-12-31 DIAGNOSIS — J45909 Unspecified asthma, uncomplicated: Secondary | ICD-10-CM | POA: Diagnosis present

## 2015-12-31 DIAGNOSIS — R260 Ataxic gait: Secondary | ICD-10-CM | POA: Insufficient documentation

## 2015-12-31 DIAGNOSIS — Z79899 Other long term (current) drug therapy: Secondary | ICD-10-CM | POA: Diagnosis not present

## 2015-12-31 DIAGNOSIS — Z6841 Body Mass Index (BMI) 40.0 and over, adult: Secondary | ICD-10-CM | POA: Insufficient documentation

## 2015-12-31 DIAGNOSIS — R609 Edema, unspecified: Secondary | ICD-10-CM | POA: Diagnosis present

## 2015-12-31 DIAGNOSIS — G459 Transient cerebral ischemic attack, unspecified: Secondary | ICD-10-CM | POA: Diagnosis not present

## 2015-12-31 DIAGNOSIS — Z96651 Presence of right artificial knee joint: Secondary | ICD-10-CM | POA: Diagnosis not present

## 2015-12-31 DIAGNOSIS — Z9989 Dependence on other enabling machines and devices: Secondary | ICD-10-CM

## 2015-12-31 DIAGNOSIS — E785 Hyperlipidemia, unspecified: Secondary | ICD-10-CM | POA: Diagnosis present

## 2015-12-31 DIAGNOSIS — Z7951 Long term (current) use of inhaled steroids: Secondary | ICD-10-CM | POA: Insufficient documentation

## 2015-12-31 DIAGNOSIS — I10 Essential (primary) hypertension: Secondary | ICD-10-CM | POA: Diagnosis not present

## 2015-12-31 DIAGNOSIS — G4733 Obstructive sleep apnea (adult) (pediatric): Secondary | ICD-10-CM | POA: Diagnosis present

## 2015-12-31 DIAGNOSIS — R2990 Unspecified symptoms and signs involving the nervous system: Secondary | ICD-10-CM | POA: Insufficient documentation

## 2015-12-31 DIAGNOSIS — G5602 Carpal tunnel syndrome, left upper limb: Secondary | ICD-10-CM | POA: Insufficient documentation

## 2015-12-31 DIAGNOSIS — Z8673 Personal history of transient ischemic attack (TIA), and cerebral infarction without residual deficits: Secondary | ICD-10-CM

## 2015-12-31 HISTORY — DX: Personal history of transient ischemic attack (TIA), and cerebral infarction without residual deficits: Z86.73

## 2015-12-31 LAB — I-STAT CHEM 8, ED
BUN: 17 mg/dL (ref 6–20)
CHLORIDE: 104 mmol/L (ref 101–111)
CREATININE: 0.7 mg/dL (ref 0.44–1.00)
Calcium, Ion: 1.06 mmol/L — ABNORMAL LOW (ref 1.13–1.30)
Glucose, Bld: 102 mg/dL — ABNORMAL HIGH (ref 65–99)
HEMATOCRIT: 45 % (ref 36.0–46.0)
Hemoglobin: 15.3 g/dL — ABNORMAL HIGH (ref 12.0–15.0)
POTASSIUM: 3.8 mmol/L (ref 3.5–5.1)
SODIUM: 142 mmol/L (ref 135–145)
TCO2: 25 mmol/L (ref 0–100)

## 2015-12-31 LAB — RAPID URINE DRUG SCREEN, HOSP PERFORMED
AMPHETAMINES: NOT DETECTED
BENZODIAZEPINES: NOT DETECTED
Barbiturates: NOT DETECTED
Cocaine: NOT DETECTED
OPIATES: NOT DETECTED
Tetrahydrocannabinol: NOT DETECTED

## 2015-12-31 LAB — CBC
HEMATOCRIT: 42.2 % (ref 36.0–46.0)
HEMOGLOBIN: 14.3 g/dL (ref 12.0–15.0)
MCH: 30.6 pg (ref 26.0–34.0)
MCHC: 33.9 g/dL (ref 30.0–36.0)
MCV: 90.4 fL (ref 78.0–100.0)
Platelets: 274 10*3/uL (ref 150–400)
RBC: 4.67 MIL/uL (ref 3.87–5.11)
RDW: 13.4 % (ref 11.5–15.5)
WBC: 9.9 10*3/uL (ref 4.0–10.5)

## 2015-12-31 LAB — PROTIME-INR
INR: 1.1 (ref 0.00–1.49)
PROTHROMBIN TIME: 14.4 s (ref 11.6–15.2)

## 2015-12-31 LAB — BASIC METABOLIC PANEL
ANION GAP: 16 — AB (ref 5–15)
BUN: 15 mg/dL (ref 6–20)
CALCIUM: 9.6 mg/dL (ref 8.9–10.3)
CHLORIDE: 104 mmol/L (ref 101–111)
CO2: 22 mmol/L (ref 22–32)
Creatinine, Ser: 0.84 mg/dL (ref 0.44–1.00)
GFR calc Af Amer: >60 mL/min (ref >60)
GFR calc non Af Amer: >60 mL/min (ref >60)
GLUCOSE: 104 mg/dL — AB (ref 65–99)
Potassium: 3.6 mmol/L (ref 3.5–5.1)
Sodium: 142 mmol/L (ref 135–145)

## 2015-12-31 LAB — ETHANOL: Alcohol, Ethyl (B): <5 mg/dL (ref <5)

## 2015-12-31 LAB — URINALYSIS, ROUTINE W REFLEX MICROSCOPIC
BILIRUBIN URINE: NEGATIVE
GLUCOSE, UA: NEGATIVE mg/dL
Ketones, ur: NEGATIVE mg/dL
Leukocytes, UA: NEGATIVE
Nitrite: NEGATIVE
PH: 5 (ref 5.0–8.0)
Protein, ur: 30 mg/dL — AB
SPECIFIC GRAVITY, URINE: 1.009 (ref 1.005–1.030)

## 2015-12-31 LAB — DIFFERENTIAL
BASOS ABS: 0.1 10*3/uL (ref 0.0–0.1)
BASOS PCT: 1 %
EOS ABS: 0.3 10*3/uL (ref 0.0–0.7)
Eosinophils Relative: 3 %
Lymphocytes Relative: 32 %
Lymphs Abs: 3 10*3/uL (ref 0.7–4.0)
MONO ABS: 0.5 10*3/uL (ref 0.1–1.0)
MONOS PCT: 6 %
NEUTROS ABS: 5.6 10*3/uL (ref 1.7–7.7)
Neutrophils Relative %: 58 %

## 2015-12-31 LAB — I-STAT TROPONIN, ED: Troponin i, poc: 0 ng/mL (ref 0.00–0.08)

## 2015-12-31 LAB — D-DIMER, QUANTITATIVE: D-Dimer, Quant: 1.1 ug/mL-FEU — ABNORMAL HIGH (ref 0.00–0.50)

## 2015-12-31 LAB — URINE MICROSCOPIC-ADD ON

## 2015-12-31 LAB — CBG MONITORING, ED: Glucose-Capillary: 105 mg/dL — ABNORMAL HIGH (ref 65–99)

## 2015-12-31 LAB — TROPONIN I

## 2015-12-31 LAB — APTT: APTT: 33 s (ref 24–37)

## 2015-12-31 MED ORDER — KETOROLAC TROMETHAMINE 15 MG/ML IJ SOLN: 15.0000 mg | Freq: Four times a day (QID) | INTRAMUSCULAR | Status: DC | PRN

## 2015-12-31 MED ORDER — IPRATROPIUM BROMIDE 0.06 % NA SOLN
2.0000 | Freq: Four times a day (QID) | NASAL | Status: DC
  Administered 2016-01-01 (×2): 2 via NASAL
  Filled 2015-12-31: qty 15

## 2015-12-31 MED ORDER — ENOXAPARIN SODIUM 40 MG/0.4ML ~~LOC~~ SOLN
40.0000 mg | SUBCUTANEOUS | Status: DC
Start: 2015-12-31 — End: 2016-01-02
  Administered 2015-12-31 – 2016-01-01 (×2): 40 mg via SUBCUTANEOUS
  Filled 2015-12-31 (×2): qty 0.4

## 2015-12-31 MED ORDER — ASPIRIN 300 MG RE SUPP: 300.0000 mg | Freq: Every day | RECTAL | Status: DC

## 2015-12-31 MED ORDER — GABAPENTIN 300 MG PO CAPS
300.0000 mg | ORAL_CAPSULE | Freq: Three times a day (TID) | ORAL | Status: DC
  Administered 2015-12-31 – 2016-01-02 (×3): 300 mg via ORAL
  Filled 2015-12-31 (×4): qty 1

## 2015-12-31 MED ORDER — IBUPROFEN 200 MG PO TABS
200.0000 mg | ORAL_TABLET | Freq: Four times a day (QID) | ORAL | Status: DC | PRN
  Administered 2016-01-01 – 2016-01-02 (×2): 400 mg via ORAL
  Filled 2015-12-31 (×2): qty 2

## 2015-12-31 MED ORDER — ALBUTEROL SULFATE (2.5 MG/3ML) 0.083% IN NEBU: 2.5000 mg | INHALATION_SOLUTION | Freq: Four times a day (QID) | RESPIRATORY_TRACT | Status: DC | PRN

## 2015-12-31 MED ORDER — MONTELUKAST SODIUM 10 MG PO TABS
10.0000 mg | ORAL_TABLET | Freq: Every day | ORAL | Status: DC
  Administered 2015-12-31 – 2016-01-01 (×2): 10 mg via ORAL
  Filled 2015-12-31 (×2): qty 1

## 2015-12-31 MED ORDER — LORATADINE 10 MG PO TABS: 10.0000 mg | ORAL_TABLET | Freq: Every day | ORAL | Status: DC | PRN

## 2015-12-31 MED ORDER — ASPIRIN 325 MG PO TABS
325.0000 mg | ORAL_TABLET | Freq: Every day | ORAL | Status: DC
  Administered 2016-01-01 – 2016-01-02 (×2): 325 mg via ORAL
  Filled 2015-12-31 (×2): qty 1

## 2015-12-31 MED ORDER — STROKE: EARLY STAGES OF RECOVERY BOOK
Freq: Once | Status: AC
  Administered 2016-01-02: 05:00:00
  Filled 2015-12-31 (×2): qty 1

## 2015-12-31 MED ORDER — ALISKIREN FUMARATE 150 MG PO TABS
300.0000 mg | ORAL_TABLET | Freq: Every day | ORAL | Status: DC
  Administered 2016-01-01 – 2016-01-02 (×2): 300 mg via ORAL
  Filled 2015-12-31 (×4): qty 2

## 2015-12-31 MED ORDER — IOHEXOL 350 MG/ML SOLN
80.0000 mL | Freq: Once | INTRAVENOUS | Status: AC | PRN
  Administered 2015-12-31: 80 mL via INTRAVENOUS

## 2015-12-31 MED ORDER — FLUTICASONE FUROATE-VILANTEROL 100-25 MCG/INH IN AEPB
1.0000 | INHALATION_SPRAY | Freq: Every day | RESPIRATORY_TRACT | Status: DC
  Administered 2016-01-01 – 2016-01-02 (×2): 1 via RESPIRATORY_TRACT
  Filled 2015-12-31: qty 28

## 2015-12-31 MED ORDER — GABAPENTIN 300 MG PO CAPS: 300.0000 mg | ORAL_CAPSULE | ORAL | Status: DC

## 2015-12-31 MED ORDER — LORATADINE 10 MG PO TABS
10.0000 mg | ORAL_TABLET | Freq: Every day | ORAL | Status: DC
  Administered 2016-01-01 – 2016-01-02 (×2): 10 mg via ORAL
  Filled 2015-12-31 (×2): qty 1

## 2015-12-31 MED ORDER — ALBUTEROL SULFATE HFA 108 (90 BASE) MCG/ACT IN AERS: 2.0000 | INHALATION_SPRAY | Freq: Four times a day (QID) | RESPIRATORY_TRACT | Status: DC | PRN

## 2015-12-31 NOTE — ED Notes (Signed)
Ordered pt a heart healthy lunch tray 

## 2015-12-31 NOTE — ED Notes (Signed)
Pt returned to room from [place], with RN Carmelina Paddock.

## 2015-12-31 NOTE — Code Documentation (Signed)
65yo female arriving to [facility] via [place] at 0806.  Patient woke up this morning not feeling well and went to work where staff found her to be diaphoretic and hypoxic with difficulty getting her words out.  EMS was called and transported the patient to the ED where a Code Stroke was called.  Stroke team to the bedside.  Patient to CT.  NIHSS 1, see documentation for details and code stroke times.  Patient with slow speech and loss of fluency otherwise able to read and name objects.  Patient was LKW at 2200 last night when she went to bed.  Patient is outside the window for treatment with tPA.  Dr. Silverio Decamp to the bedside.  Patient to go to MRI.  Bedside handoff with ED RN Carmelina Paddock.

## 2015-12-31 NOTE — ED Notes (Signed)
MD states he wants to call Code stroke.

## 2015-12-31 NOTE — ED Notes (Signed)
Pt made aware of bed assignment 

## 2015-12-31 NOTE — Consult Note (Signed)
Requesting Physician: Dr. Venora Maples    Chief Complaint: weakness  HPI:                                                                                                                                         Alison Richards is an 65 y.o. female presents to the emergency department from her office where the physician who works in the office called EMS as the patient had developed chest pain shortness of breath and was initially hypoxic appearing and diaphoretic. She was having difficulty with her speech at that time. Patient was brought to ED due to her difficulty finding words and generalized weakness. Patient was outside the tPA window. Limited MRI brain showed no acute stroke  Date last known well: yesterday Time last known well: 2200 tPA Given: No: out of window     Past Medical History  Diagnosis Date  . Palpitations   . Orthostatic lightheadedness   . Pain in joint   . Hypertension   . Asthma     PAST HX OF ASTHMA WHILE TAKING LISINOPRIL--NO PROBLEMS SINCE DISCONTINUING LISINOPRIL  . Shortness of breath     WITH EXERTION-PT RELATES TO HER WEIGHT  . Obstructive sleep apnea     CPAP EVERY NIGHT-SETTING IS 8  . Blood transfusion     AGE 29 -HEMORRHAGED AFTER TONSILLECTOMY  . Protein in urine   . GERD (gastroesophageal reflux disease)     PAST HX  H-PYLORI   . H/O hiatal hernia   . Arthritis     PAIN AND OA RIGHT KNEE  . PONV (postoperative nausea and vomiting)   . Obesity   . H. pylori infection     Hx of   . Hyperlipemia   . Kidney stones   . Wears dentures     full top  . Wears glasses     Past Surgical History  Procedure Laterality Date  . Bladder surgery    . Tubal ligation  1979  . Cesarean section  1979  . Abdominal hysterectomy  1999  . Neck sx for ruptured disc c4-5  2001  . Tonsillectomy  age 63  . Cholecystectomy  3-08    Dr Lennie Hummer  . Left knee replacement  2008  . Total knee arthroplasty  11/14/2011    Procedure: TOTAL KNEE ARTHROPLASTY;   Surgeon: Mauri Pole, MD;  Location: [facility];  Service: Orthopedics;  Laterality: Right;  combined with general block  . Hand surgery      Bilateral   . Esophageal manometry  09/02/2012    Procedure: ESOPHAGEAL MANOMETRY (EM);  Surgeon: Sable Feil, MD;  Location: [facility];  Service: Endoscopy;  Laterality: N/A;  . Colonoscopy    . Trigger finger release Right 12/25/2013    Procedure: RIGHT LONG TRIGGER RELEASE ;  Surgeon: Tennis Must, MD;  Location: [place];  Service:  Orthopedics;  Laterality: Right;    Family History  Problem Relation Age of Onset  . Alzheimer's disease Mother 96  . Hypertension Mother   . Hyperlipidemia Mother   . Alcohol abuse Father   . Cancer Father   . COPD Father   . Colon cancer Neg Hx   . Lung cancer Father   . Diverticulitis Mother   . Skin cancer Father   . Prostate cancer Father    Social History:  reports that she has never smoked. She has never used smokeless tobacco. She reports that she does not drink alcohol or use illicit drugs.  Allergies:  Allergies  Allergen Reactions  . Dilaudid [Hydromorphone Hcl] Anaphylaxis and Swelling    Throat closing up  . Hydromorphone Anaphylaxis and Shortness Of Breath    Pt had a tightening in her throat  . Acetaminophen Other (See Comments)    hallucinations, cold sweats, headache, metallic taste, tongue swelling  . Angiotensin Receptor Blockers Other (See Comments)    Joint aches  . Erythromycin Ethylsuccinate Diarrhea    gi symptoms, stomach swelling  . Lisinopril Other (See Comments)    ASTHMA LIKE SYMPTOMS, coughing  . Losartan Potassium-Hctz     REACTION: JOINTS aching, hurting all over  . Morphine Sulfate Nausea And Vomiting    Projectile vomiting  . Valsartan     REACTION: Hurting all over  . Latex Itching and Rash    SENSITIVE TO SOME ADHESIVES.  . Sulfa Antibiotics Swelling and Rash    Eyes swelled shut  . Sulfonamide Derivatives Swelling and Rash  . Tape  Rash    Red and bruising, Please use "paper" tape only    Medications:                                                                                                                           No current facility-administered medications for this encounter.   Current Outpatient Prescriptions  Medication Sig Dispense Refill  . albuterol (PROVENTIL HFA;VENTOLIN HFA) 108 (90 BASE) MCG/ACT inhaler Inhale 2 puffs into the lungs every 6 (six) hours as needed for wheezing. 1 Inhaler 1  . [place]-Hydrochlorothiazide (TEKTURNA HCT) 300-12.5 MG TABS Take 1 tablet by mouth daily before breakfast.     . AMBULATORY NON FORMULARY MEDICATION CPAP Machine Uses at bedtime as directed    . AMBULATORY NON FORMULARY MEDICATION Medication Name: CPAP hose replacement Dx: OSA  Dx code: G47.33 1 each 0  . cetirizine (ZYRTEC) 10 MG tablet Take 10 mg by mouth daily as needed for allergies.    . diazepam (VALIUM) 5 MG tablet Take 1 tab PO 1 hour before procedure or imaging. (Patient taking differently: Take 5 mg by mouth See admin instructions. Take 1 tablet by mouth 1 hour before procedure or imaging.) 2 tablet 0  . fluconazole (DIFLUCAN) 150 MG tablet Take 1 tablet (150 mg total) by mouth once. 1 tablet 0  . fluticasone furoate-vilanterol (BREO ELLIPTA) 100-25 MCG/INH AEPB  Inhale 1 puff into the lungs daily. 3 each 3  . gabapentin (NEURONTIN) 300 MG capsule Take 300 mg by mouth See admin instructions. 300mg  at bedtime for one week, then 300mg  twice daily for one week, then 300mg  three times daily starting 3/2    . hydrochlorothiazide (HYDRODIURIL) 25 MG tablet Take 12.5 mg by mouth daily.    Marland Kitchen ipratropium (ATROVENT) 0.06 % nasal spray Place 2 sprays into both nostrils 4 (four) times daily. 15 mL 1  . loratadine (CLARITIN) 10 MG tablet Take 10 mg by mouth daily as needed for allergies.    . Menthol, Topical Analgesic, (BIOFREEZE EX) Apply 1 application topically daily as needed (for pain).    . montelukast  (SINGULAIR) 10 MG tablet Take 1 tablet (10 mg total) by mouth at bedtime. 30 tablet 5  . naproxen sodium (ANAPROX) 220 MG tablet Take 220-440 mg by mouth 2 (two) times daily as needed (for pain).    . polyethylene glycol powder (GLYCOLAX/MIRALAX) powder Take 17 g by mouth daily as needed for mild constipation.        ROS:                                                                                                                                       History obtained from the patient  General ROS: negative for - chills, fatigue, fever, night sweats, weight gain or weight loss Psychological ROS: negative for - behavioral disorder, hallucinations, memory difficulties, mood swings or suicidal ideation Ophthalmic ROS: negative for - blurry vision, double vision, eye pain or loss of vision ENT ROS: negative for - epistaxis, nasal discharge, oral lesions, sore throat, tinnitus or vertigo Allergy and Immunology ROS: negative for - hives or itchy/watery eyes Hematological and Lymphatic ROS: negative for - bleeding problems, bruising or swollen lymph nodes Endocrine ROS: negative for - galactorrhea, hair pattern changes, polydipsia/polyuria or temperature intolerance Respiratory ROS: negative for - cough, hemoptysis, shortness of breath or wheezing Cardiovascular ROS: negative for - chest pain, dyspnea on exertion, edema or irregular heartbeat Gastrointestinal ROS: negative for - abdominal pain, diarrhea, hematemesis, nausea/vomiting or stool incontinence Genito-Urinary ROS: negative for - dysuria, hematuria, incontinence or urinary frequency/urgency Musculoskeletal ROS: negative for - joint swelling or muscular weakness Neurological ROS: as noted in HPI Dermatological ROS: negative for rash and skin lesion changes  Neurologic Examination:  Blood pressure 142/80, pulse 118, temperature 98 F (36.7  C), temperature source Oral, resp. rate 26, SpO2 100 %.  HEENT-  Normocephalic, no lesions, without obvious abnormality.  Normal external eye and conjunctiva.  Normal TM's bilaterally.  Normal auditory canals and external ears. Normal external nose, mucus membranes and septum.  Normal pharynx. Cardiovascular- S1, S2 normal, pulses palpable throughout   Lungs- chest clear, no wheezing, rales, normal symmetric air entry Abdomen- normal findings: bowel sounds normal Extremities- no edema Lymph-no adenopathy palpable Musculoskeletal-no joint tenderness, deformity or swelling Skin-warm and dry, no hyperpigmentation, vitiligo, or suspicious lesions  Neurological Examination Mental Status: Alert, oriented.  Speech slow but fluent without evidence of aphasia.  Able to follow 3 step commands without difficulty. Cranial Nerves: II: Visual fields grossly normal, pupils equal, round, reactive to light and accommodation III,IV, VI: ptosis not present, extra-ocular motions intact bilaterally V,VII: smile symmetric, facial light touch sensation normal bilaterally VIII: hearing normal bilaterally IX,X: uvula rises symmetrically XI: bilateral shoulder shrug XII: midline tongue extension Motor: Right : Upper extremity   5/5    Left:     Upper extremity   5/5  Lower extremity   5/5     Lower extremity   5/5 Tone and bulk:normal tone throughout; no atrophy noted Sensory: Pinprick and light touch intact throughout, bilaterally Deep Tendon Reflexes: 2+ and symmetric throughout Plantars: Right: downgoing   Left: downgoing Cerebellar: normal finger-to-nose and normal heel-to-shin test Gait: slightly ataxic gait       Lab Results: Basic Metabolic Panel:  Recent Labs Lab 12/31/15 0849 12/31/15 0855  NA 142 142  K 3.6 3.8  CL 104 104  CO2 22  --   GLUCOSE 104* 102*  BUN 15 17  CREATININE 0.84 0.70  CALCIUM 9.6  --     Liver Function Tests: No results for input(s): AST, ALT, ALKPHOS,  BILITOT, PROT, ALBUMIN in the last 168 hours. No results for input(s): LIPASE, AMYLASE in the last 168 hours. No results for input(s): AMMONIA in the last 168 hours.  CBC:  Recent Labs Lab 12/31/15 0849 12/31/15 0855 12/31/15 0912  WBC 9.9  --   --   NEUTROABS  --   --  5.6  HGB 14.3 15.3*  --   HCT 42.2 45.0  --   MCV 90.4  --   --   PLT 274  --   --     Cardiac Enzymes: No results for input(s): CKTOTAL, CKMB, CKMBINDEX, TROPONINI in the last 168 hours.  Lipid Panel: No results for input(s): CHOL, TRIG, HDL, CHOLHDL, VLDL, LDLCALC in the last 168 hours.  CBG:  Recent Labs Lab 12/31/15 0846  GLUCAP 105*    Microbiology: Results for orders placed or performed in visit on [id number]  Helicobacter pylori special antigen     Status: None   Collection Time: 07/17/12 12:00 AM  Result Value Ref Range Status   H. PYLORI Antigen  NEGATIVE  Final   H. PYLORI Antigen  Antimicrobials,proton pump inhibitors and bismuth  Final   H. PYLORI Antigen    Final    preparations are known to suppress H.pylori and ingestion of   H. PYLORI Antigen    Final    these prior to testing may cause a false negative result. If   H. PYLORI Antigen    Final    a negative result is obtained for a patient that has    H. PYLORI Antigen  ingested these compounds within two weeks prior  of  Final   H. PYLORI Antigen    Final    performing the H.pylori test, results may be falsely   H. PYLORI Antigen    Final    negative and should be repeated with a new specimen two   H. PYLORI Antigen  weeks after discontinuing treatment.  Final    Coagulation Studies:  Recent Labs  12/31/15 0912  LABPROT 14.4  INR 1.10    Imaging: Ct Head Wo Contrast  12/31/2015  CLINICAL DATA:  Unsteady gait and garbled speech EXAM: CT HEAD WITHOUT CONTRAST TECHNIQUE: Contiguous axial images were obtained from the base of the skull through the vertex without intravenous contrast. COMPARISON:  03/03/2014 FINDINGS: The bony  calvarium is intact. The ventricles are of normal size and configuration. No findings to suggest acute hemorrhage, acute infarction or space-occupying mass lesion are noted. IMPRESSION: No acute abnormality noted. These results were called by telephone at the time of interpretation on 12/31/2015 at 9:07 am to Dr. Silverio Decamp, who verbally acknowledged these results. Electronically Signed   By: Inez Catalina M.D.   On: 12/31/2015 09:09   Dg Chest Portable 1 View  12/31/2015  CLINICAL DATA:  Shortness of Breath EXAM: PORTABLE CHEST 1 VIEW COMPARISON:  June 28, 2015 FINDINGS: No edema or consolidation. The heart size and pulmonary vascularity are normal. No adenopathy. No bone lesions. IMPRESSION: No edema or consolidation. Electronically Signed   By: Lowella Grip III M.D.   On: 12/31/2015 09:34       Assessment and plan discussed with with attending physician and they are in agreement.    Etta Quill PA-C [facility] [phone]  12/31/2015, 9:49 AM   Assessment: 65 y.o. female   Stroke Risk Factors - hyperlipidemia and hypertension

## 2015-12-31 NOTE — ED Notes (Signed)
Admitting at bedside 

## 2015-12-31 NOTE — ED Notes (Signed)
Patient sent to MRI by RN

## 2015-12-31 NOTE — H&P (Signed)
[facility] History and Physical                                                                                    Alison Richards, is a 65 y.o. female  MRN: [id number]   DOB - 1950-12-27  Admit Date - 12/31/2015  Outpatient Primary MD for the patient is METHENEY,CATHERINE, MD  Referring MD: Venora Maples / ER  Consulting M.D: Silverio Decamp / Neurology  PMH: Past Medical History  Diagnosis Date  . Palpitations   . Orthostatic lightheadedness   . Pain in joint   . Hypertension   . Asthma     PAST HX OF ASTHMA WHILE TAKING LISINOPRIL--NO PROBLEMS SINCE DISCONTINUING LISINOPRIL  . Shortness of breath     WITH EXERTION-PT RELATES TO HER WEIGHT  . Obstructive sleep apnea     CPAP EVERY NIGHT-SETTING IS 8  . Blood transfusion     AGE 89 -HEMORRHAGED AFTER TONSILLECTOMY  . Protein in urine   . GERD (gastroesophageal reflux disease)     PAST HX  H-PYLORI   . H/O hiatal hernia   . Arthritis     PAIN AND OA RIGHT KNEE  . PONV (postoperative nausea and vomiting)   . Obesity   . H. pylori infection     Hx of   . Hyperlipemia   . Kidney stones   . Wears dentures     full top  . Wears glasses       PSH: Past Surgical History  Procedure Laterality Date  . Bladder surgery    . Tubal ligation  1979  . Cesarean section  1979  . Abdominal hysterectomy  1999  . Neck sx for ruptured disc c4-5  2001  . Tonsillectomy  age 12  . Cholecystectomy  3-08    Dr Lennie Hummer  . Left knee replacement  2008  . Total knee arthroplasty  11/14/2011    Procedure: TOTAL KNEE ARTHROPLASTY;  Surgeon: Mauri Pole, MD;  Location: [facility];  Service: Orthopedics;  Laterality: Right;  combined with general block  . Hand surgery      Bilateral   . Esophageal manometry  09/02/2012    Procedure: ESOPHAGEAL MANOMETRY (EM);  Surgeon: Sable Feil, MD;  Location: [facility];  Service: Endoscopy;  Laterality: N/A;  . Colonoscopy    . Trigger finger release Right 12/25/2013    Procedure: RIGHT LONG  TRIGGER RELEASE ;  Surgeon: Tennis Must, MD;  Location: [facility];  Service: Orthopedics;  Laterality: Right;     CC:  Chief Complaint  Patient presents with  . Weakness     HPI: 65 year old morbidly obese female patient (BMI 50.3, sleep apnea on CPAP, hypertension, asthma, dyslipidemia, peripheral neuropathy, new problem of low back pain with occasional left lumbar radiculitis with numbness of leg (OP workup in process), and chronic bilateral lower shiny edema. Patient reports that she woke up at her usual time this morning and just did not feel right and felt "clumsy". She was not dizzy she did not have chest pain or shortness of breath that she is aware of. She was aware she was having difficulty forming  words but felt that symptoms may clear and she prepared to go to work. Unfortunately symptoms persisted and by the time she arrived to work at [employer] the staff told her that she did not look well. One of the attending physicians was in the building and quickly ushered the patient into a treatment room. Patient was not aware of it but according to staff and that physician the patient appeared to be quite dyspneic with a bluish discoloration to her hands and face. Nasal cannula oxygen was applied and the patient apparently improved. The only other new problems she has noticed in the past 24 hours with some nonspecific left neck pain last night. She was subsequently transferred via EMS to come hospital for further evaluation. Yesterday after her CPE she was started on gabapentin for issues with peripheral neuropathy. She has had some increase in lower stream and he edema. She states that it is typical for her weight to go up and down but no rapid weight gain in a period of less than 3-5 days. Long trips or airplane flights. Of note she has had a small "knot" since childhood in the left upper arm medial aspect which has increased in size and has become painful and her  PCP has referred her to a "surgeon" to have this evaluated.  ER Evaluation and treatment: Temp 98.3-EP 167/76-pulse 91 regular-respirations 16-room air saturations 99% EKG: Sinus rhythm with ventricular rate 87 bpm, QTC 450 ms, right axis deviation and nonspecific T-wave abnormalities but no definitive evidence of ischemia. Code stroke initiated in the ER and patient subsequently evaluated by neurology team-last known well unknown so not a candidate for TPA CT head without contrast: No acute abnormality PCXR: No edema or consolidation MRI brain limited without contrast; normal without evidence of stroke-minimal left mastoid effusion CTA of the neck with and without contrast: Tortuous ICA bilaterally without significant stenosis, no significant focal stenosis or vascular pathology in the neck, normal variant MRA circle of Willis without significant proximal stenosis, aneurysm or branch vessel occlusion. Laboratory data: Na 142, K 3.8, BUN 17, Cr 0.70, WBC 9900, hemoglobin 14.3, platelets 274,000, coags normal, urinalysis mildly abnormal with cloudy appearance rare bacteria, trace hemoglobin, no leukocytes, no nitrite, 30 of protein, mildly dilute specific gravity 1.009 and W BC 0-5, alcohol level less than 5, urine drug screen negative   Review of Systems   In addition to the HPI above,  No Fever-chills, myalgias or other constitutional symptoms No Headache, changes with Vision or hearing, new weakness, tingling, numbness in any extremity; transient dysarthria without any gait ataxia and no facial drooping or ptosis No problems swallowing food or Liquids, indigestion/reflux No Chest pain, Cough, palpitations, orthopnea  No Abdominal pain, N/V; no melena or hematochezia, no dark tarry stools No dysuria, hematuria or flank pain No new skin rashes, lesions, masses or bruises, No new joints pains-aches No polyuria, polydypsia or polyphagia,  *A full 10 point Review of Systems was done, except as  stated above, all other Review of Systems were negative.  Social History Social History  Substance Use Topics  . Smoking status: Never Smoker   . Smokeless tobacco: Never Used  . Alcohol Use: No    Resides at: Private residence  Lives with: Spouse  Ambulatory status: Without assistive devices   Family History Family History  Problem Relation Age of Onset  . Alzheimer's disease Mother 64  . Hypertension Mother   . Hyperlipidemia Mother   . Alcohol abuse Father   .  Cancer Father   . COPD Father   . Colon cancer Neg Hx   . Lung cancer Father   . Diverticulitis Mother   . Skin cancer Father   . Prostate cancer Father      Prior to Admission medications   Medication Sig Start Date End Date Taking? Authorizing Provider  albuterol (PROVENTIL HFA;VENTOLIN HFA) 108 (90 BASE) MCG/ACT inhaler Inhale 2 puffs into the lungs every 6 (six) hours as needed for wheezing. 09/30/15 09/29/16 Yes Gregor Hams, MD  Aliskiren-Hydrochlorothiazide (TEKTURNA HCT) 300-12.5 MG TABS Take 1 tablet by mouth daily before breakfast.    Yes Historical Provider, MD  AMBULATORY NON FORMULARY MEDICATION CPAP Machine Uses at bedtime as directed   Yes Historical Provider, MD  AMBULATORY NON FORMULARY MEDICATION Medication Name: CPAP hose replacement Dx: OSA  Dx code: G47.33 08/25/15  Yes Hali Marry, MD  cetirizine (ZYRTEC) 10 MG tablet Take 10 mg by mouth daily as needed for allergies.   Yes Historical Provider, MD  diazepam (VALIUM) 5 MG tablet Take 1 tab PO 1 hour before procedure or imaging. Patient taking differently: Take 5 mg by mouth See admin instructions. Take 1 tablet by mouth 1 hour before procedure or imaging. 12/30/15  Yes Silverio Decamp, MD  fluconazole (DIFLUCAN) 150 MG tablet Take 1 tablet (150 mg total) by mouth once. 12/30/15  Yes Hali Marry, MD  fluticasone furoate-vilanterol (BREO ELLIPTA) 100-25 MCG/INH AEPB Inhale 1 puff into the lungs daily. 12/30/15  Yes Hali Marry, MD  gabapentin (NEURONTIN) 300 MG capsule Take 300 mg by mouth See admin instructions. 300mg  at bedtime for one week, then 300mg  twice daily for one week, then 300mg  three times daily starting 3/2   Yes Historical Provider, MD  hydrochlorothiazide (HYDRODIURIL) 25 MG tablet Take 12.5 mg by mouth daily.   Yes Historical Provider, MD  ipratropium (ATROVENT) 0.06 % nasal spray Place 2 sprays into both nostrils 4 (four) times daily. 09/30/15  Yes Gregor Hams, MD  loratadine (CLARITIN) 10 MG tablet Take 10 mg by mouth daily as needed for allergies.   Yes Historical Provider, MD  Menthol, Topical Analgesic, (BIOFREEZE EX) Apply 1 application topically daily as needed (for pain).   Yes Historical Provider, MD  montelukast (SINGULAIR) 10 MG tablet Take 1 tablet (10 mg total) by mouth at bedtime. 06/28/15  Yes Emeterio Reeve, DO  naproxen sodium (ANAPROX) 220 MG tablet Take 220-440 mg by mouth 2 (two) times daily as needed (for pain).   Yes Historical Provider, MD  polyethylene glycol powder (GLYCOLAX/MIRALAX) powder Take 17 g by mouth daily as needed for mild constipation.  11/04/11  Yes Historical Provider, MD    Allergies  Allergen Reactions  . Dilaudid [Hydromorphone Hcl] Anaphylaxis and Swelling    Throat closing up  . Hydromorphone Anaphylaxis and Shortness Of Breath    Pt had a tightening in her throat  . Acetaminophen Other (See Comments)    hallucinations, cold sweats, headache, metallic taste, tongue swelling  . Angiotensin Receptor Blockers Other (See Comments)    Joint aches  . Erythromycin Ethylsuccinate Diarrhea    gi symptoms, stomach swelling  . Lisinopril Other (See Comments)    ASTHMA LIKE SYMPTOMS, coughing  . Losartan Potassium-Hctz     REACTION: JOINTS aching, hurting all over  . Morphine Sulfate Nausea And Vomiting    Projectile vomiting  . Valsartan     REACTION: Hurting all over  . Latex Itching and Rash  SENSITIVE TO SOME ADHESIVES.  . Sulfa Antibiotics  Swelling and Rash    Eyes swelled shut  . Sulfonamide Derivatives Swelling and Rash  . Tape Rash    Red and bruising, Please use "paper" tape only    Physical Exam  Vitals  Blood pressure 147/87, pulse 89, temperature 98 F (36.7 C), temperature source Oral, resp. rate 22, SpO2 93 %.   General:  In no acute distress, appears healthy and well nourished  Psych:  Normal affect, Denies Suicidal or Homicidal ideations, Awake Alert, Oriented X 3. Speech and thought patterns Have improved since arrival the patient still has subtle word recall issues, no apparent short term memory deficits  Neuro:   No focal neurological deficits, CN II through XII intact, Strength 5/5 all 4 extremities, Sensation intact all 4 extremities.  ENT:  Ears and Eyes appear Normal, Conjunctivae clear, PER. Moist oral mucosa without erythema or exudates.  Neck:  Supple, No lymphadenopathy appreciated  Respiratory:  Symmetrical chest wall movement, Good air movement bilaterally, CTAB. Room Air  Cardiac:  RRR, No Murmurs, 1+ bilateral LE edema noted, no JVD, No carotid bruits, peripheral pulses palpable at 2+  Abdomen:  Positive bowel sounds, Soft, Non tender, Non distended,  No masses appreciated, no obvious hepatosplenomegaly  Skin:  No Cyanosis, Normal Skin Turgor, No Skin Rash or Bruise.  Extremities: Symmetrical without obvious trauma or injury,  no effusions. Bilateral lower extremities just above the ankle tender to touch  Data Review  CBC  Recent Labs Lab 12/31/15 0849 12/31/15 0855 12/31/15 0912  WBC 9.9  --   --   HGB 14.3 15.3*  --   HCT 42.2 45.0  --   PLT 274  --   --   MCV 90.4  --   --   MCH 30.6  --   --   MCHC 33.9  --   --   RDW 13.4  --   --   LYMPHSABS  --   --  3.0  MONOABS  --   --  0.5  EOSABS  --   --  0.3  BASOSABS  --   --  0.1    Chemistries   Recent Labs Lab 12/31/15 0849 12/31/15 0855  NA 142 142  K 3.6 3.8  CL 104 104  CO2 22  --   GLUCOSE 104* 102*    BUN 15 17  CREATININE 0.84 0.70  CALCIUM 9.6  --     estimated creatinine clearance is 96.5 mL/min (by C-G formula based on Cr of 0.7).  No results for input(s): TSH, T4TOTAL, T3FREE, THYROIDAB in the last 72 hours.  Invalid input(s): FREET3  Coagulation profile  Recent Labs Lab 12/31/15 0912  INR 1.10     Recent Labs  12/31/15 1158  DDIMER 1.10*    Cardiac Enzymes No results for input(s): CKMB, TROPONINI, MYOGLOBIN in the last 168 hours.  Invalid input(s): CK  Invalid input(s): POCBNP  Urinalysis    Component Value Date/Time   COLORURINE YELLOW 12/31/2015 0915   APPEARANCEUR CLOUDY* 12/31/2015 0915   LABSPEC 1.009 12/31/2015 0915   PHURINE 5.0 12/31/2015 0915   GLUCOSEU NEGATIVE 12/31/2015 0915   HGBUR TRACE* 12/31/2015 0915   HGBUR small 03/08/2009 1346   BILIRUBINUR NEGATIVE 12/31/2015 0915   KETONESUR NEGATIVE 12/31/2015 0915   PROTEINUR 30* 12/31/2015 0915   UROBILINOGEN 0.2 10/05/2012 0935   NITRITE NEGATIVE 12/31/2015 0915   LEUKOCYTESUR NEGATIVE 12/31/2015 0915    Imaging results:   Ct  Angio Head W/cm &/or Wo Cm  12/31/2015  CLINICAL DATA:  Episode of a aphasia today. The patient appeared cyanotic. Symptoms have since resolved. EXAM: CT ANGIOGRAPHY HEAD AND NECK TECHNIQUE: Multidetector CT imaging of the head and neck was performed using the standard protocol during bolus administration of intravenous contrast. Multiplanar CT image reconstructions and MIPs were obtained to evaluate the vascular anatomy. Carotid stenosis measurements (when applicable) are obtained utilizing NASCET criteria, using the distal internal carotid diameter as the denominator. CONTRAST:  45mL OMNIPAQUE IOHEXOL 350 MG/ML SOLN COMPARISON:  MRI of the brain from the same day. FINDINGS: CTA NECK Aortic arch: A 3 vessel arch configuration is present. No significant atherosclerotic calcifications are present at the aortic arch. There is no significant stenosis at the origin of the  great vessels. Right carotid system: The right common carotid artery is within normal limits. The carotid bifurcation is within normal limits. There is mild tortuosity of the cervical right ICA without significant stenosis. Left carotid system: The left common carotid artery is within normal limits. The bifurcation is within normal limits. Mild tortuosity is present in the cervical left ICA without significant stenosis. Vertebral arteries:Both vertebral arteries originate from the subclavian arteries without focal stenosis. There is no significant stenosis or focal vessel injury within the cervical portions of the vertebral arteries. Skeleton: Anterior cervical fusion is present at C4-5 and C5-6. Vertebral body heights and alignment are maintained. No other focal lesions are present. Other neck: No focal mucosal or submucosal lesions are present. Intra parotid lymph nodes are evident bilaterally. The salivary glands are otherwise within normal limits. There is no significant adenopathy. The thyroid is unremarkable. The lung apices are clear. The superior mediastinum is within normal limits. CTA HEAD Anterior circulation: The internal carotid arteries are within normal limits from the high cervical segments through the ICA termini bilaterally. The right internal carotid artery is of slightly larger caliber than the left. The right A1 segment is dominant. The anterior communicating artery is patent. Both A2 segments fill normally. There is a fenestration at the proximal right M1 segment without associated aneurysm. The MCA bifurcations are within normal limits bilaterally. The ACA and MCA branch vessels are within normal limits. Posterior circulation: The PICA origins are visualized and normal bilaterally. The vertebrobasilar junction is normal. The basilar artery is within normal limits. Both posterior cerebral arteries originate from the basilar tip. The PCA branch vessels are within normal limits. Venous sinuses:  The dural sinuses are patent. The right transverse sinus is dominant. The straight sinus and deep cerebral veins are patent. The cortical veins are within normal limits. Anatomic variants: None Delayed phase: The postcontrast images demonstrate no pathologic enhancement. IMPRESSION: 1. Mild tortuosity of the cervical internal carotid arteries bilaterally without significant stenosis. 2. No significant focal stenosis or vascular pathology in the neck. 3. Normal variant MRA circle of Willis without significant proximal stenosis, aneurysm, or branch vessel occlusion. 4. C4-5 and C5-6 anterior cervical fusion without radiographic evidence for complication. Electronically Signed   By: San Morelle M.D.   On: 12/31/2015 11:26   Ct Head Wo Contrast  12/31/2015  CLINICAL DATA:  Unsteady gait and garbled speech EXAM: CT HEAD WITHOUT CONTRAST TECHNIQUE: Contiguous axial images were obtained from the base of the skull through the vertex without intravenous contrast. COMPARISON:  03/03/2014 FINDINGS: The bony calvarium is intact. The ventricles are of normal size and configuration. No findings to suggest acute hemorrhage, acute infarction or space-occupying mass lesion are noted. IMPRESSION: No  acute abnormality noted. These results were called by telephone at the time of interpretation on 12/31/2015 at 9:07 am to Dr. Silverio Decamp, who verbally acknowledged these results. Electronically Signed   By: Inez Catalina M.D.   On: 12/31/2015 09:09   Ct Angio Neck W/cm &/or Wo/cm  12/31/2015  CLINICAL DATA:  Episode of a aphasia today. The patient appeared cyanotic. Symptoms have since resolved. EXAM: CT ANGIOGRAPHY HEAD AND NECK TECHNIQUE: Multidetector CT imaging of the head and neck was performed using the standard protocol during bolus administration of intravenous contrast. Multiplanar CT image reconstructions and MIPs were obtained to evaluate the vascular anatomy. Carotid stenosis measurements (when applicable) are obtained  utilizing NASCET criteria, using the distal internal carotid diameter as the denominator. CONTRAST:  81mL OMNIPAQUE IOHEXOL 350 MG/ML SOLN COMPARISON:  MRI of the brain from the same day. FINDINGS: CTA NECK Aortic arch: A 3 vessel arch configuration is present. No significant atherosclerotic calcifications are present at the aortic arch. There is no significant stenosis at the origin of the great vessels. Right carotid system: The right common carotid artery is within normal limits. The carotid bifurcation is within normal limits. There is mild tortuosity of the cervical right ICA without significant stenosis. Left carotid system: The left common carotid artery is within normal limits. The bifurcation is within normal limits. Mild tortuosity is present in the cervical left ICA without significant stenosis. Vertebral arteries:Both vertebral arteries originate from the subclavian arteries without focal stenosis. There is no significant stenosis or focal vessel injury within the cervical portions of the vertebral arteries. Skeleton: Anterior cervical fusion is present at C4-5 and C5-6. Vertebral body heights and alignment are maintained. No other focal lesions are present. Other neck: No focal mucosal or submucosal lesions are present. Intra parotid lymph nodes are evident bilaterally. The salivary glands are otherwise within normal limits. There is no significant adenopathy. The thyroid is unremarkable. The lung apices are clear. The superior mediastinum is within normal limits. CTA HEAD Anterior circulation: The internal carotid arteries are within normal limits from the high cervical segments through the ICA termini bilaterally. The right internal carotid artery is of slightly larger caliber than the left. The right A1 segment is dominant. The anterior communicating artery is patent. Both A2 segments fill normally. There is a fenestration at the proximal right M1 segment without associated aneurysm. The MCA  bifurcations are within normal limits bilaterally. The ACA and MCA branch vessels are within normal limits. Posterior circulation: The PICA origins are visualized and normal bilaterally. The vertebrobasilar junction is normal. The basilar artery is within normal limits. Both posterior cerebral arteries originate from the basilar tip. The PCA branch vessels are within normal limits. Venous sinuses: The dural sinuses are patent. The right transverse sinus is dominant. The straight sinus and deep cerebral veins are patent. The cortical veins are within normal limits. Anatomic variants: None Delayed phase: The postcontrast images demonstrate no pathologic enhancement. IMPRESSION: 1. Mild tortuosity of the cervical internal carotid arteries bilaterally without significant stenosis. 2. No significant focal stenosis or vascular pathology in the neck. 3. Normal variant MRA circle of Willis without significant proximal stenosis, aneurysm, or branch vessel occlusion. 4. C4-5 and C5-6 anterior cervical fusion without radiographic evidence for complication. Electronically Signed   By: San Morelle M.D.   On: 12/31/2015 11:26   Dg Chest Portable 1 View  12/31/2015  CLINICAL DATA:  Shortness of Breath EXAM: PORTABLE CHEST 1 VIEW COMPARISON:  June 28, 2015 FINDINGS: No edema or  consolidation. The heart size and pulmonary vascularity are normal. No adenopathy. No bone lesions. IMPRESSION: No edema or consolidation. Electronically Signed   By: Lowella Grip III M.D.   On: 12/31/2015 09:34   Mr Brain Ltd W/o Cm  12/31/2015  CLINICAL DATA:  Episode of expressive a aphasia upon waking today. Symptoms have since improved. Stroke like symptoms. EXAM: MRI HEAD WITHOUT CONTRAST TECHNIQUE: Multiplanar, multiecho pulse sequences of the brain and surrounding structures were obtained without intravenous contrast. COMPARISON:  CT head without contrast 12/31/2015. MRI brain 03/03/2014. FINDINGS: The diffusion-weighted images  demonstrate no evidence for acute or subacute infarct. There is no hemorrhage or mass lesion. The ventricles are of normal size. No significant extraaxial fluid collection is present. The internal auditory canals are within normal limits bilaterally. The brainstem and cerebellum are within normal limits. Note is made of a relatively empty sella. Flow is present in the major intracranial arteries. The globes and orbits are intact. The paranasal sinuses and right mastoid air cells are clear. There is some fluid posteriorly in the left mastoid air cells. No obstructing nasopharyngeal lesion is evident. IMPRESSION: 1. Normal MRI appearance of the brain. No acute or focal abnormality to explain the patient's a phasic episode. 2. Relatively empty sella there this is unlikely be of consequence to the patient. 3. Minimal left mastoid effusion. No obstructing nasopharyngeal lesion is present. Electronically Signed   By: San Morelle M.D.   On: 12/31/2015 10:38     EKG: (Independently reviewed)  Sinus rhythm with ventricular rate 87 bpm, QTC 450 ms, right axis deviation and nonspecific T-wave abnormalities but no definitive evidence of ischemia.   Assessment & Plan  Principal Problem:   TIA  -Symptomatology has essentially resolved and were not associated with any other focal deficits; CTA had neck and MRI both negative for acute stroke-? Symptoms possibly precipitated by potential hypoxia prior to arrival (based on history obtained) -Admit to telemetry/Obs -Continue stroke workup: Check echocardiogram -PT/OT/SLP evaluation -HgbA1c and lipid panel -Antiplatelet with aspirin  Active Problems:   LEG EDEMA, BILATERAL/Acute dyspnea -Despite patient having symptoms are to arrival of bluish discoloration of the face and hands she had no awareness that she may be hypoxic and did not awareness of shortness of breath -Check lower extremity duplex -Check d-dimer and if positive check CT angiogram of the  chest to rule out PE (patient does have incomplete RBBB/right axis deviation chronically)  **d-dimer mildly elevated 1.10 -Preadmission thiazide diuretic on hold for now    Morbid obesity with BMI of 50.0-59.9, adult -Weight reduction strategy per PCP    OSA on CPAP -Continue at bedtime CPAP -Suspect has a degree of obesity hypoventilation syndrome as well    HYPERTENSION, BENIGN SYSTEMIC -Continue Tekturna without thiazide diuretic    Asthma -Continue preadmission medications    Hyperlipidemia -Not on statin prior to admission    Left lumbar radiculitis -Has outpatient MRI scheduled    Peripheral neuropathy -Continue newly started Neurontin    Drug allergies -Patient has multiple drug allergies including anaphylaxis to Dilaudid and hallucinations with tongue swelling to acetaminophen-NSAIDs ordered for pain management which patient has tolerated well in the past    DVT Prophylaxis: Lovenox-Will increase to full dose if CTA positive for PE  Family Communication:  Daughter at bedside   Code Status:  Full code  Condition:  Stable  Discharge disposition: Anticipate discharge back to previous home environment  Time spent in minutes : 60      ELLIS,ALLISON L.  ANP on 12/31/2015 at 12:40 PM  You may contact me by going to [email or web address] - password [email or web address]  I am available from 7a-7p but please confirm I am on the schedule by going to Amion as above.   After 7p please contact night coverage person covering me after hours  [facility]

## 2015-12-31 NOTE — ED Notes (Signed)
Patient still out of room for testing

## 2015-12-31 NOTE — ED Notes (Signed)
[facility] notified of code stroke.

## 2015-12-31 NOTE — ED Provider Notes (Signed)
CSN: [id number]     Arrival date & time 12/31/15  0806 History   First MD Initiated Contact with Patient 12/31/15 [phone]     Chief Complaint  Patient presents with  . Weakness    Level 5 caveat: Aphasia  HPI Patient presents to the emergency department from her office where the physician who works in the office called EMS as the patient had developed chest pain shortness of breath and was initially hypoxic appearing and diaphoretic.  She was having difficulty with her speech at that time.  I spoke with the office physician who reported that she had no obvious focal neurologic deficits during a brief neurologic exam except for her obvious aphasia.  Is also reported me that the patient was having some mild chest pain and the patient reports she was having some neck pain last night.  She is no longer having neck pain at this time.  She was able to drive herself to work.  She has good strength in her arms and legs right now but is having difficulty finding words and providing additional history.   Past Medical History  Diagnosis Date  . Palpitations   . Orthostatic lightheadedness   . Pain in joint   . Hypertension   . Asthma     PAST HX OF ASTHMA WHILE TAKING LISINOPRIL--NO PROBLEMS SINCE DISCONTINUING LISINOPRIL  . Shortness of breath     WITH EXERTION-PT RELATES TO HER WEIGHT  . Obstructive sleep apnea     CPAP EVERY NIGHT-SETTING IS 8  . Blood transfusion     AGE 26 -HEMORRHAGED AFTER TONSILLECTOMY  . Protein in urine   . GERD (gastroesophageal reflux disease)     PAST HX  H-PYLORI   . H/O hiatal hernia   . Arthritis     PAIN AND OA RIGHT KNEE  . PONV (postoperative nausea and vomiting)   . Obesity   . H. pylori infection     Hx of   . Hyperlipemia   . Kidney stones   . Wears dentures     full top  . Wears glasses    Past Surgical History  Procedure Laterality Date  . Bladder surgery    . Tubal ligation  1979  . Cesarean section  1979  . Abdominal hysterectomy  1999  .  Neck sx for ruptured disc c4-5  2001  . Tonsillectomy  age 76  . Cholecystectomy  3-08    Dr Lennie Hummer  . Left knee replacement  2008  . Total knee arthroplasty  11/14/2011    Procedure: TOTAL KNEE ARTHROPLASTY;  Surgeon: Mauri Pole, MD;  Location: [facility];  Service: Orthopedics;  Laterality: Right;  combined with general block  . Hand surgery      Bilateral   . Esophageal manometry  09/02/2012    Procedure: ESOPHAGEAL MANOMETRY (EM);  Surgeon: Sable Feil, MD;  Location: [facility];  Service: Endoscopy;  Laterality: N/A;  . Colonoscopy    . Trigger finger release Right 12/25/2013    Procedure: RIGHT LONG TRIGGER RELEASE ;  Surgeon: Tennis Must, MD;  Location: [facility];  Service: Orthopedics;  Laterality: Right;   Family History  Problem Relation Age of Onset  . Alzheimer's disease Mother 60  . Hypertension Mother   . Hyperlipidemia Mother   . Alcohol abuse Father   . Cancer Father   . COPD Father   . Colon cancer Neg Hx   . Lung cancer Father   .  Diverticulitis Mother   . Skin cancer Father   . Prostate cancer Father    Social History  Substance Use Topics  . Smoking status: Never Smoker   . Smokeless tobacco: Never Used  . Alcohol Use: No   OB History    No data available     Review of Systems  Reason unable to perform ROS: aphasia.      Allergies  Dilaudid; Hydromorphone; Acetaminophen; Angiotensin receptor blockers; Erythromycin ethylsuccinate; Lisinopril; Losartan potassium-hctz; Morphine sulfate; Valsartan; Latex; Sulfa antibiotics; Sulfonamide derivatives; and Tape  Home Medications   Prior to Admission medications   Medication Sig Start Date End Date Taking? Authorizing Provider  albuterol (PROVENTIL HFA;VENTOLIN HFA) 108 (90 BASE) MCG/ACT inhaler Inhale 2 puffs into the lungs every 6 (six) hours as needed for wheezing. 09/30/15 09/29/16 Yes Gregor Hams, MD  Aliskiren-Hydrochlorothiazide (TEKTURNA HCT) 300-12.5 MG TABS Take 1  tablet by mouth daily before breakfast.    Yes Historical Provider, MD  AMBULATORY NON FORMULARY MEDICATION CPAP Machine Uses at bedtime as directed   Yes Historical Provider, MD  AMBULATORY NON FORMULARY MEDICATION Medication Name: CPAP hose replacement Dx: OSA  Dx code: G47.33 08/25/15  Yes Hali Marry, MD  cetirizine (ZYRTEC) 10 MG tablet Take 10 mg by mouth daily as needed for allergies.   Yes Historical Provider, MD  diazepam (VALIUM) 5 MG tablet Take 1 tab PO 1 hour before procedure or imaging. Patient taking differently: Take 5 mg by mouth See admin instructions. Take 1 tablet by mouth 1 hour before procedure or imaging. 12/30/15  Yes Silverio Decamp, MD  fluconazole (DIFLUCAN) 150 MG tablet Take 1 tablet (150 mg total) by mouth once. 12/30/15  Yes Hali Marry, MD  fluticasone furoate-vilanterol (BREO ELLIPTA) 100-25 MCG/INH AEPB Inhale 1 puff into the lungs daily. 12/30/15  Yes Hali Marry, MD  gabapentin (NEURONTIN) 300 MG capsule Take 300 mg by mouth See admin instructions. 300mg  at bedtime for one week, then 300mg  twice daily for one week, then 300mg  three times daily starting 3/2   Yes Historical Provider, MD  hydrochlorothiazide (HYDRODIURIL) 25 MG tablet Take 12.5 mg by mouth daily.   Yes Historical Provider, MD  ipratropium (ATROVENT) 0.06 % nasal spray Place 2 sprays into both nostrils 4 (four) times daily. 09/30/15  Yes Gregor Hams, MD  loratadine (CLARITIN) 10 MG tablet Take 10 mg by mouth daily as needed for allergies.   Yes Historical Provider, MD  Menthol, Topical Analgesic, (BIOFREEZE EX) Apply 1 application topically daily as needed (for pain).   Yes Historical Provider, MD  montelukast (SINGULAIR) 10 MG tablet Take 1 tablet (10 mg total) by mouth at bedtime. 06/28/15  Yes Emeterio Reeve, DO  naproxen sodium (ANAPROX) 220 MG tablet Take 220-440 mg by mouth 2 (two) times daily as needed (for pain).   Yes Historical Provider, MD  polyethylene glycol  powder (GLYCOLAX/MIRALAX) powder Take 17 g by mouth daily as needed for mild constipation.  11/04/11  Yes Historical Provider, MD   BP 142/80 mmHg  Pulse 118  Temp(Src) 98 F (36.7 C) (Oral)  Resp 26  SpO2 100% Physical Exam  Constitutional: She appears well-developed and well-nourished. No distress.  Concerned appearing  HENT:  Head: Normocephalic and atraumatic.  Eyes: EOM are normal. Pupils are equal, round, and reactive to light.  Neck: Normal range of motion. Neck supple.  Cardiovascular: Normal rate, regular rhythm and normal heart sounds.   Pulmonary/Chest: Effort normal and breath sounds normal.  Abdominal: Soft. She exhibits no distension. There is no tenderness.  Musculoskeletal: Normal range of motion.  Neurological: She is alert.  Grossly normal strength in her bilateral upper lower extremity major muscle groups.  Word finding difficulty and aphasia  Skin: Skin is warm. She is diaphoretic.  Psychiatric: She has a normal mood and affect. Judgment normal.  Nursing note and vitals reviewed.   ED Course  Procedures (including critical care time)  CRITICAL CARE Performed by: Hoy Morn Total critical care time: 33 minutes Critical care time was exclusive of separately billable procedures and treating other patients. Critical care was necessary to treat or prevent imminent or life-threatening deterioration. Critical care was time spent personally by me on the following activities: development of treatment plan with patient and/or surrogate as well as nursing, discussions with consultants, evaluation of patient's response to treatment, examination of patient, obtaining history from patient or surrogate, ordering and performing treatments and interventions, ordering and review of laboratory studies, ordering and review of radiographic studies, pulse oximetry and re-evaluation of patient's condition.   Labs Review Labs Reviewed  BASIC METABOLIC PANEL - Abnormal; Notable  for the following:    Glucose, Bld 104 (*)    Anion gap 16 (*)    All other components within normal limits  URINALYSIS, ROUTINE W REFLEX MICROSCOPIC (NOT AT [facility]) - Abnormal; Notable for the following:    APPearance CLOUDY (*)    Hgb urine dipstick TRACE (*)    Protein, ur 30 (*)    All other components within normal limits  URINE MICROSCOPIC-ADD ON - Abnormal; Notable for the following:    Squamous Epithelial / LPF 0-5 (*)    Bacteria, UA RARE (*)    All other components within normal limits  D-DIMER, QUANTITATIVE (NOT AT [facility]) - Abnormal; Notable for the following:    D-Dimer, Quant 1.10 (*)    All other components within normal limits  CBG MONITORING, ED - Abnormal; Notable for the following:    Glucose-Capillary 105 (*)    All other components within normal limits  I-STAT CHEM 8, ED - Abnormal; Notable for the following:    Glucose, Bld 102 (*)    Calcium, Ion 1.06 (*)    Hemoglobin 15.3 (*)    All other components within normal limits  CBC  ETHANOL  PROTIME-INR  APTT  DIFFERENTIAL  URINE RAPID DRUG SCREEN, HOSP PERFORMED  TROPONIN I  I-STAT TROPOININ, ED    Imaging Review Ct Angio Head W/cm &/or Wo Cm  12/31/2015  CLINICAL DATA:  Episode of a aphasia today. The patient appeared cyanotic. Symptoms have since resolved. EXAM: CT ANGIOGRAPHY HEAD AND NECK TECHNIQUE: Multidetector CT imaging of the head and neck was performed using the standard protocol during bolus administration of intravenous contrast. Multiplanar CT image reconstructions and MIPs were obtained to evaluate the vascular anatomy. Carotid stenosis measurements (when applicable) are obtained utilizing NASCET criteria, using the distal internal carotid diameter as the denominator. CONTRAST:  64mL OMNIPAQUE IOHEXOL 350 MG/ML SOLN COMPARISON:  MRI of the brain from the same day. FINDINGS: CTA NECK Aortic arch: A 3 vessel arch configuration is present. No significant atherosclerotic calcifications are present at the  aortic arch. There is no significant stenosis at the origin of the great vessels. Right carotid system: The right common carotid artery is within normal limits. The carotid bifurcation is within normal limits. There is mild tortuosity of the cervical right ICA without significant stenosis. Left carotid system: The left common carotid artery is  within normal limits. The bifurcation is within normal limits. Mild tortuosity is present in the cervical left ICA without significant stenosis. Vertebral arteries:Both vertebral arteries originate from the subclavian arteries without focal stenosis. There is no significant stenosis or focal vessel injury within the cervical portions of the vertebral arteries. Skeleton: Anterior cervical fusion is present at C4-5 and C5-6. Vertebral body heights and alignment are maintained. No other focal lesions are present. Other neck: No focal mucosal or submucosal lesions are present. Intra parotid lymph nodes are evident bilaterally. The salivary glands are otherwise within normal limits. There is no significant adenopathy. The thyroid is unremarkable. The lung apices are clear. The superior mediastinum is within normal limits. CTA HEAD Anterior circulation: The internal carotid arteries are within normal limits from the high cervical segments through the ICA termini bilaterally. The right internal carotid artery is of slightly larger caliber than the left. The right A1 segment is dominant. The anterior communicating artery is patent. Both A2 segments fill normally. There is a fenestration at the proximal right M1 segment without associated aneurysm. The MCA bifurcations are within normal limits bilaterally. The ACA and MCA branch vessels are within normal limits. Posterior circulation: The PICA origins are visualized and normal bilaterally. The vertebrobasilar junction is normal. The basilar artery is within normal limits. Both posterior cerebral arteries originate from the basilar tip.  The PCA branch vessels are within normal limits. Venous sinuses: The dural sinuses are patent. The right transverse sinus is dominant. The straight sinus and deep cerebral veins are patent. The cortical veins are within normal limits. Anatomic variants: None Delayed phase: The postcontrast images demonstrate no pathologic enhancement. IMPRESSION: 1. Mild tortuosity of the cervical internal carotid arteries bilaterally without significant stenosis. 2. No significant focal stenosis or vascular pathology in the neck. 3. Normal variant MRA circle of Willis without significant proximal stenosis, aneurysm, or branch vessel occlusion. 4. C4-5 and C5-6 anterior cervical fusion without radiographic evidence for complication. Electronically Signed   By: San Morelle M.D.   On: 12/31/2015 11:26   Ct Head Wo Contrast  12/31/2015  CLINICAL DATA:  Unsteady gait and garbled speech EXAM: CT HEAD WITHOUT CONTRAST TECHNIQUE: Contiguous axial images were obtained from the base of the skull through the vertex without intravenous contrast. COMPARISON:  03/03/2014 FINDINGS: The bony calvarium is intact. The ventricles are of normal size and configuration. No findings to suggest acute hemorrhage, acute infarction or space-occupying mass lesion are noted. IMPRESSION: No acute abnormality noted. These results were called by telephone at the time of interpretation on 12/31/2015 at 9:07 am to Dr. Silverio Decamp, who verbally acknowledged these results. Electronically Signed   By: Inez Catalina M.D.   On: 12/31/2015 09:09   Ct Angio Neck W/cm &/or Wo/cm  12/31/2015  CLINICAL DATA:  Episode of a aphasia today. The patient appeared cyanotic. Symptoms have since resolved. EXAM: CT ANGIOGRAPHY HEAD AND NECK TECHNIQUE: Multidetector CT imaging of the head and neck was performed using the standard protocol during bolus administration of intravenous contrast. Multiplanar CT image reconstructions and MIPs were obtained to evaluate the vascular  anatomy. Carotid stenosis measurements (when applicable) are obtained utilizing NASCET criteria, using the distal internal carotid diameter as the denominator. CONTRAST:  73mL OMNIPAQUE IOHEXOL 350 MG/ML SOLN COMPARISON:  MRI of the brain from the same day. FINDINGS: CTA NECK Aortic arch: A 3 vessel arch configuration is present. No significant atherosclerotic calcifications are present at the aortic arch. There is no significant stenosis at the origin of  the great vessels. Right carotid system: The right common carotid artery is within normal limits. The carotid bifurcation is within normal limits. There is mild tortuosity of the cervical right ICA without significant stenosis. Left carotid system: The left common carotid artery is within normal limits. The bifurcation is within normal limits. Mild tortuosity is present in the cervical left ICA without significant stenosis. Vertebral arteries:Both vertebral arteries originate from the subclavian arteries without focal stenosis. There is no significant stenosis or focal vessel injury within the cervical portions of the vertebral arteries. Skeleton: Anterior cervical fusion is present at C4-5 and C5-6. Vertebral body heights and alignment are maintained. No other focal lesions are present. Other neck: No focal mucosal or submucosal lesions are present. Intra parotid lymph nodes are evident bilaterally. The salivary glands are otherwise within normal limits. There is no significant adenopathy. The thyroid is unremarkable. The lung apices are clear. The superior mediastinum is within normal limits. CTA HEAD Anterior circulation: The internal carotid arteries are within normal limits from the high cervical segments through the ICA termini bilaterally. The right internal carotid artery is of slightly larger caliber than the left. The right A1 segment is dominant. The anterior communicating artery is patent. Both A2 segments fill normally. There is a fenestration at the  proximal right M1 segment without associated aneurysm. The MCA bifurcations are within normal limits bilaterally. The ACA and MCA branch vessels are within normal limits. Posterior circulation: The PICA origins are visualized and normal bilaterally. The vertebrobasilar junction is normal. The basilar artery is within normal limits. Both posterior cerebral arteries originate from the basilar tip. The PCA branch vessels are within normal limits. Venous sinuses: The dural sinuses are patent. The right transverse sinus is dominant. The straight sinus and deep cerebral veins are patent. The cortical veins are within normal limits. Anatomic variants: None Delayed phase: The postcontrast images demonstrate no pathologic enhancement. IMPRESSION: 1. Mild tortuosity of the cervical internal carotid arteries bilaterally without significant stenosis. 2. No significant focal stenosis or vascular pathology in the neck. 3. Normal variant MRA circle of Willis without significant proximal stenosis, aneurysm, or branch vessel occlusion. 4. C4-5 and C5-6 anterior cervical fusion without radiographic evidence for complication. Electronically Signed   By: San Morelle M.D.   On: 12/31/2015 11:26   Dg Chest Portable 1 View  12/31/2015  CLINICAL DATA:  Shortness of Breath EXAM: PORTABLE CHEST 1 VIEW COMPARISON:  June 28, 2015 FINDINGS: No edema or consolidation. The heart size and pulmonary vascularity are normal. No adenopathy. No bone lesions. IMPRESSION: No edema or consolidation. Electronically Signed   By: Lowella Grip III M.D.   On: 12/31/2015 09:34   Mr Brain Ltd W/o Cm  12/31/2015  CLINICAL DATA:  Episode of expressive a aphasia upon waking today. Symptoms have since improved. Stroke like symptoms. EXAM: MRI HEAD WITHOUT CONTRAST TECHNIQUE: Multiplanar, multiecho pulse sequences of the brain and surrounding structures were obtained without intravenous contrast. COMPARISON:  CT head without contrast 12/31/2015.  MRI brain 03/03/2014. FINDINGS: The diffusion-weighted images demonstrate no evidence for acute or subacute infarct. There is no hemorrhage or mass lesion. The ventricles are of normal size. No significant extraaxial fluid collection is present. The internal auditory canals are within normal limits bilaterally. The brainstem and cerebellum are within normal limits. Note is made of a relatively empty sella. Flow is present in the major intracranial arteries. The globes and orbits are intact. The paranasal sinuses and right mastoid air cells are clear. There is  some fluid posteriorly in the left mastoid air cells. No obstructing nasopharyngeal lesion is evident. IMPRESSION: 1. Normal MRI appearance of the brain. No acute or focal abnormality to explain the patient's a phasic episode. 2. Relatively empty sella there this is unlikely be of consequence to the patient. 3. Minimal left mastoid effusion. No obstructing nasopharyngeal lesion is present. Electronically Signed   By: San Morelle M.D.   On: 12/31/2015 10:38   I have personally reviewed and evaluated these images and lab results as part of my medical decision-making.   EKG Interpretation   Date/Time:  Friday December 31 2015 08:12:51 EST Ventricular Rate:  87 PR Interval:  188 QRS Duration: 101 QT Interval:  374 QTC Calculation: 450 R Axis:   99 Text Interpretation:  Sinus rhythm Right axis deviation Borderline T  abnormalities, anterior leads No significant change was found Confirmed by  Dorsie Sethi  MD, Maire Govan ([id number]) on 12/31/2015 9:28:02 AM      MDM   Final diagnoses:  Transient cerebral ischemia, unspecified transient cerebral ischemia type    Given what sounds like progression of her symptoms since EMS saw the patient a code stroke was called for emergent neurologic consultation.  She underwent head CT which shows no obvious abnormality.  Note on return to the room her expressive aphasia has resolved.  She will undergo MRI of her  head and MRA of her head and neck.  She did have this left-sided neck pain yesterday which raises the question of possible carotid dissection.  MRA will help better delineate this.  Per neurology she is not candidate for TPA at this time given unknown last known well time and now resolution of symptoms.  We'll continue to observe her closely.  Her chest x-ray demonstrates no abnormality and her EKG demonstrates no ischemic changes.  She is no longer having active neck or chest symptoms.  She is not hypoxic.      Jola Schmidt, MD 12/31/15 1248

## 2015-12-31 NOTE — ED Notes (Signed)
Patient still off unit in MRI

## 2015-12-31 NOTE — ED Notes (Signed)
Attempted report 

## 2015-12-31 NOTE — ED Notes (Signed)
Patient off the floor for CT scan

## 2015-12-31 NOTE — ED Notes (Signed)
Patient comes from work states when she woke up she "just was not feeling right". Patient was seen by MD at her job and was found to have blue lips. Staff placed her on 6L . Per EMS patient was alert and oriented x4 apporitate color Patient same on arrival, Patient does have some slurred speech and difficulty finding her words. Per staff this is not the patient's baseline.

## 2016-01-01 DIAGNOSIS — I10 Essential (primary) hypertension: Secondary | ICD-10-CM | POA: Diagnosis not present

## 2016-01-01 DIAGNOSIS — G459 Transient cerebral ischemic attack, unspecified: Secondary | ICD-10-CM | POA: Diagnosis not present

## 2016-01-01 DIAGNOSIS — R2990 Unspecified symptoms and signs involving the nervous system: Secondary | ICD-10-CM | POA: Diagnosis not present

## 2016-01-01 LAB — LIPID PANEL
Cholesterol: 178 mg/dL (ref 0–200)
HDL: 42 mg/dL (ref >40)
LDL Cholesterol: 108 mg/dL — ABNORMAL HIGH (ref 0–99)
Total CHOL/HDL Ratio: 4.2 RATIO
Triglycerides: 142 mg/dL (ref <150)
VLDL: 28 mg/dL (ref 0–40)

## 2016-01-01 MED ORDER — PRAVASTATIN SODIUM 20 MG PO TABS
20.0000 mg | ORAL_TABLET | Freq: Every day | ORAL | Status: DC
  Administered 2016-01-01: 20 mg via ORAL
  Filled 2016-01-01: qty 1

## 2016-01-01 NOTE — Progress Notes (Signed)
OT Cancellation Note  Patient Details Name: Alison Richards MRN: [id number] DOB: April 07, 1951   Cancelled Treatment:    Reason Eval/Treat Not Completed: OT screened, no needs identified, will sign off. Pt reports she is functioning at her baseline and declines need for further therapy services. Pt also appears to be completing transfer and ambulation independently during PT session. Please re-consult if needs or situation changes.  Redmond Baseman, OTR/L Pager: [phone] 01/01/2016, 3:22 PM

## 2016-01-01 NOTE — Evaluation (Signed)
Physical Therapy Evaluation AND Discharge summary Patient Details Name: Alison Richards MRN: [id number] DOB: 1950/11/05 Today's Date: 01/01/2016   History of Present Illness  Pt admitted with TIA.  See MD H&P for PMH  Clinical Impression  Pt admitted with TIA. Presents to PT at baseline level of function. Pt does have some mild balance issues which she states are caused by age, weight, and h/o TKAs.  No further PT required.  Educated pt that if she has symptoms again to call 911 for immediate medical attention.  PT will sign off.       Follow Up Recommendations No PT follow up    Equipment Recommendations  None recommended by PT    Recommendations for Other Services       Precautions / Restrictions Restrictions Weight Bearing Restrictions: No      Mobility  Bed Mobility Overal bed mobility: Independent                Transfers Overall transfer level: Independent                  Ambulation/Gait Ambulation/Gait assistance: Independent Ambulation Distance (Feet): 200 Feet Assistive device: None Gait Pattern/deviations: Step-through pattern   Gait velocity interpretation: at or above normal speed for age/gender General Gait Details: No balance losses with gait.  Had pt start/sup, head turns, move in different directions.    Stairs            Wheelchair Mobility    Modified Rankin (Stroke Patients Only)       Balance Overall balance assessment: Independent (Pt reports some limitations with balance premorbid)                               Standardized Balance Assessment Standardized Balance Assessment : Dynamic Gait Index   Dynamic Gait Index Level Surface: Normal Change in Gait Speed: Normal Gait with Horizontal Head Turns: Normal Gait with Vertical Head Turns: Normal Gait and Pivot Turn: Normal Step Over Obstacle: Mild Impairment Step Around Obstacles: Normal Steps: Mild Impairment (not tested on true step) Total  Score: 22       Pertinent Vitals/Pain Pain Assessment: No/denies pain    Home Living Family/patient expects to be discharged to:: Private residence Living Arrangements: Spouse/significant other   Type of Home: House Home Access: Stairs to enter   Technical brewer of Steps: 2          Prior Function Level of Independence: Independent (Working PTA)               Journalist, newspaper        Extremity/Trunk Assessment   Upper Extremity Assessment: Overall WFL for tasks assessed (Pt reports no difficulty with writing. )           Lower Extremity Assessment: Overall WFL for tasks assessed (Pt reports some weakness since TKA)      Cervical / Trunk Assessment: Normal  Communication   Communication: No difficulties (Pt feels word finding is at baseline)  Cognition Arousal/Alertness: Awake/alert Behavior During Therapy: WFL for tasks assessed/performed Overall Cognitive Status: Within Functional Limits for tasks assessed                      General Comments General comments (skin integrity, edema, etc.): No family present.  Pt feels she is back at baseline. Reports her speech is much improved from yesterday.     Exercises  Assessment/Plan    PT Assessment Patent does not need any further PT services  PT Diagnosis Abnormality of gait   PT Problem List    PT Treatment Interventions     PT Goals (Current goals can be found in the Care Plan section)      Frequency     Barriers to discharge        Co-evaluation               End of Session Equipment Utilized During Treatment: Gait belt Activity Tolerance: Patient tolerated treatment well Patient left: in bed;with call bell/phone within reach Nurse Communication: Mobility status         Time: [id number] PT Time Calculation (min) (ACUTE ONLY): 21 min   Charges:   PT Evaluation $PT Eval Low Complexity: 1 Procedure     PT G CodesMelvern Banker 01/01/2016,  1:48 PM Lavonia Dana, PT  [phone] 01/01/2016

## 2016-01-01 NOTE — Progress Notes (Signed)
[facility] PROGRESS NOTE  Alison Richards [id number] DOB: 1951-08-10 DOA: 12/31/2015 PCP: Beatrice Lecher, MD  Assessment/Plan: Principal Problem:   TIA (transient ischemic attack) - Currently undergoing work up by neurology will await for neurology's final recommendations  Active Problems:   OSA on CPAP - stable, pt may continue cpap here while in hospital    HYPERTENSION, BENIGN SYSTEMIC - stable currently    LEG EDEMA, BILATERAL   Hyperlipidemia - lipid panel reviewed, will place on pravastatin    Left lumbar radiculitis - stable continue supportive therapy    Peripheral neuropathy (HCC) - on gabapentin    Acute dyspnea   Asthma Stable, no wheezes, albuterol as needed    Morbid obesity with BMI of 50.0-59.9, adult ([place])  Code Status: full Family Communication: None at bedside Disposition Plan: Pending improvement in condition   Consultants:  Neurology  Procedures:  Please refer to EMR  Antibiotics:  none  HPI/Subjective: Pt has no new complaints. No acute issues reported overnight.  Objective: Filed Vitals:   01/01/16 0700 01/01/16 0929  BP: 99/52 135/81  Pulse: 73 83  Temp:  98 F (36.7 C)  Resp: 18 18   No intake or output data in the 24 hours ending 01/01/16 1237 Filed Weights   12/31/15 2100  Weight: 131.679 kg (290 lb 4.8 oz)    Exam:   General:  Pt in nad, alert and awake  Cardiovascular: rrr, no mrg  Respiratory: cta bl, no wheezes  Abdomen: soft, nt, nd  Musculoskeletal: no cyanosis or clubbing   Data Reviewed: Basic Metabolic Panel:  Recent Labs Lab 12/31/15 0849 12/31/15 0855  NA 142 142  K 3.6 3.8  CL 104 104  CO2 22  --   GLUCOSE 104* 102*  BUN 15 17  CREATININE 0.84 0.70  CALCIUM 9.6  --    Liver Function Tests: No results for input(s): AST, ALT, ALKPHOS, BILITOT, PROT, ALBUMIN in the last 168 hours. No results for input(s): LIPASE, AMYLASE in the last 168 hours. No results for  input(s): AMMONIA in the last 168 hours. CBC:  Recent Labs Lab 12/31/15 0849 12/31/15 0855 12/31/15 0912  WBC 9.9  --   --   NEUTROABS  --   --  5.6  HGB 14.3 15.3*  --   HCT 42.2 45.0  --   MCV 90.4  --   --   PLT 274  --   --    Cardiac Enzymes:  Recent Labs Lab 12/31/15 2036  TROPONINI <0.03   BNP (last 3 results)  Recent Labs  06/18/15 1929  BNP 15.0    ProBNP (last 3 results) No results for input(s): PROBNP in the last 8760 hours.  CBG:  Recent Labs Lab 12/31/15 0846  GLUCAP 105*    No results found for this or any previous visit (from the past 240 hour(s)).   Studies: Ct Angio Head W/cm &/or Wo Cm  12/31/2015  CLINICAL DATA:  Episode of a aphasia today. The patient appeared cyanotic. Symptoms have since resolved. EXAM: CT ANGIOGRAPHY HEAD AND NECK TECHNIQUE: Multidetector CT imaging of the head and neck was performed using the standard protocol during bolus administration of intravenous contrast. Multiplanar CT image reconstructions and MIPs were obtained to evaluate the vascular anatomy. Carotid stenosis measurements (when applicable) are obtained utilizing NASCET criteria, using the distal internal carotid diameter as the denominator. CONTRAST:  26mL OMNIPAQUE IOHEXOL 350 MG/ML SOLN COMPARISON:  MRI of the brain from the same day. FINDINGS: CTA NECK  Aortic arch: A 3 vessel arch configuration is present. No significant atherosclerotic calcifications are present at the aortic arch. There is no significant stenosis at the origin of the great vessels. Right carotid system: The right common carotid artery is within normal limits. The carotid bifurcation is within normal limits. There is mild tortuosity of the cervical right ICA without significant stenosis. Left carotid system: The left common carotid artery is within normal limits. The bifurcation is within normal limits. Mild tortuosity is present in the cervical left ICA without significant stenosis. Vertebral  arteries:Both vertebral arteries originate from the subclavian arteries without focal stenosis. There is no significant stenosis or focal vessel injury within the cervical portions of the vertebral arteries. Skeleton: Anterior cervical fusion is present at C4-5 and C5-6. Vertebral body heights and alignment are maintained. No other focal lesions are present. Other neck: No focal mucosal or submucosal lesions are present. Intra parotid lymph nodes are evident bilaterally. The salivary glands are otherwise within normal limits. There is no significant adenopathy. The thyroid is unremarkable. The lung apices are clear. The superior mediastinum is within normal limits. CTA HEAD Anterior circulation: The internal carotid arteries are within normal limits from the high cervical segments through the ICA termini bilaterally. The right internal carotid artery is of slightly larger caliber than the left. The right A1 segment is dominant. The anterior communicating artery is patent. Both A2 segments fill normally. There is a fenestration at the proximal right M1 segment without associated aneurysm. The MCA bifurcations are within normal limits bilaterally. The ACA and MCA branch vessels are within normal limits. Posterior circulation: The PICA origins are visualized and normal bilaterally. The vertebrobasilar junction is normal. The basilar artery is within normal limits. Both posterior cerebral arteries originate from the basilar tip. The PCA branch vessels are within normal limits. Venous sinuses: The dural sinuses are patent. The right transverse sinus is dominant. The straight sinus and deep cerebral veins are patent. The cortical veins are within normal limits. Anatomic variants: None Delayed phase: The postcontrast images demonstrate no pathologic enhancement. IMPRESSION: 1. Mild tortuosity of the cervical internal carotid arteries bilaterally without significant stenosis. 2. No significant focal stenosis or vascular  pathology in the neck. 3. Normal variant MRA circle of Willis without significant proximal stenosis, aneurysm, or branch vessel occlusion. 4. C4-5 and C5-6 anterior cervical fusion without radiographic evidence for complication. Electronically Signed   By: San Morelle M.D.   On: 12/31/2015 11:26   Ct Head Wo Contrast  12/31/2015  CLINICAL DATA:  Unsteady gait and garbled speech EXAM: CT HEAD WITHOUT CONTRAST TECHNIQUE: Contiguous axial images were obtained from the base of the skull through the vertex without intravenous contrast. COMPARISON:  03/03/2014 FINDINGS: The bony calvarium is intact. The ventricles are of normal size and configuration. No findings to suggest acute hemorrhage, acute infarction or space-occupying mass lesion are noted. IMPRESSION: No acute abnormality noted. These results were called by telephone at the time of interpretation on 12/31/2015 at 9:07 am to Dr. Silverio Decamp, who verbally acknowledged these results. Electronically Signed   By: Inez Catalina M.D.   On: 12/31/2015 09:09   Ct Angio Neck W/cm &/or Wo/cm  12/31/2015  CLINICAL DATA:  Episode of a aphasia today. The patient appeared cyanotic. Symptoms have since resolved. EXAM: CT ANGIOGRAPHY HEAD AND NECK TECHNIQUE: Multidetector CT imaging of the head and neck was performed using the standard protocol during bolus administration of intravenous contrast. Multiplanar CT image reconstructions and MIPs were obtained to evaluate  the vascular anatomy. Carotid stenosis measurements (when applicable) are obtained utilizing NASCET criteria, using the distal internal carotid diameter as the denominator. CONTRAST:  80mL OMNIPAQUE IOHEXOL 350 MG/ML SOLN COMPARISON:  MRI of the brain from the same day. FINDINGS: CTA NECK Aortic arch: A 3 vessel arch configuration is present. No significant atherosclerotic calcifications are present at the aortic arch. There is no significant stenosis at the origin of the great vessels. Right carotid system:  The right common carotid artery is within normal limits. The carotid bifurcation is within normal limits. There is mild tortuosity of the cervical right ICA without significant stenosis. Left carotid system: The left common carotid artery is within normal limits. The bifurcation is within normal limits. Mild tortuosity is present in the cervical left ICA without significant stenosis. Vertebral arteries:Both vertebral arteries originate from the subclavian arteries without focal stenosis. There is no significant stenosis or focal vessel injury within the cervical portions of the vertebral arteries. Skeleton: Anterior cervical fusion is present at C4-5 and C5-6. Vertebral body heights and alignment are maintained. No other focal lesions are present. Other neck: No focal mucosal or submucosal lesions are present. Intra parotid lymph nodes are evident bilaterally. The salivary glands are otherwise within normal limits. There is no significant adenopathy. The thyroid is unremarkable. The lung apices are clear. The superior mediastinum is within normal limits. CTA HEAD Anterior circulation: The internal carotid arteries are within normal limits from the high cervical segments through the ICA termini bilaterally. The right internal carotid artery is of slightly larger caliber than the left. The right A1 segment is dominant. The anterior communicating artery is patent. Both A2 segments fill normally. There is a fenestration at the proximal right M1 segment without associated aneurysm. The MCA bifurcations are within normal limits bilaterally. The ACA and MCA branch vessels are within normal limits. Posterior circulation: The PICA origins are visualized and normal bilaterally. The vertebrobasilar junction is normal. The basilar artery is within normal limits. Both posterior cerebral arteries originate from the basilar tip. The PCA branch vessels are within normal limits. Venous sinuses: The dural sinuses are patent. The  right transverse sinus is dominant. The straight sinus and deep cerebral veins are patent. The cortical veins are within normal limits. Anatomic variants: None Delayed phase: The postcontrast images demonstrate no pathologic enhancement. IMPRESSION: 1. Mild tortuosity of the cervical internal carotid arteries bilaterally without significant stenosis. 2. No significant focal stenosis or vascular pathology in the neck. 3. Normal variant MRA circle of Willis without significant proximal stenosis, aneurysm, or branch vessel occlusion. 4. C4-5 and C5-6 anterior cervical fusion without radiographic evidence for complication. Electronically Signed   By: San Morelle M.D.   On: 12/31/2015 11:26   Ct Angio Chest Pe W/cm &/or Wo Cm  12/31/2015  CLINICAL DATA:  Shortness of breath and hypertension EXAM: CT ANGIOGRAPHY CHEST WITH CONTRAST TECHNIQUE: Multidetector CT imaging of the chest was performed using the standard protocol during bolus administration of intravenous contrast. Multiplanar CT image reconstructions and MIPs were obtained to evaluate the vascular anatomy. CONTRAST:  66mL OMNIPAQUE IOHEXOL 350 MG/ML SOLN COMPARISON:  Chest radiograph December 31, 2015 FINDINGS: Mediastinum/Lymph Nodes: There is no demonstrable pulmonary embolus. There is no thoracic aortic aneurysm or dissection. The visualized great vessels appear unremarkable. Visualized thyroid appears unremarkable. There are small mediastinal lymph nodes which do not meet size criteria for pathologic significance. There is no adenopathy by size criteria. Pericardium is not thickened. There is mild left ventricular hypertrophy. There is  a small hiatal hernia. Lungs/Pleura: There is patchy bibasilar atelectasis. There is no edema or consolidation. There is mild lower lobe bronchiectatic change bilaterally. Upper abdomen: Visualized upper abdominal structures are normal. Musculoskeletal: There is degenerative change in the lower thoracic region. There are  no blastic or lytic bone lesions. Review of the MIP images confirms the above findings. IMPRESSION: No demonstrable pulmonary embolus. Patchy bibasilar atelectasis. Mild lower lobe bronchiectatic change. No lung edema or consolidation. No adenopathy. Mild left ventricular hypertrophy. Small hiatal hernia. Electronically Signed   By: Lowella Grip III M.D.   On: 12/31/2015 14:24   Dg Chest Portable 1 View  12/31/2015  CLINICAL DATA:  Shortness of Breath EXAM: PORTABLE CHEST 1 VIEW COMPARISON:  June 28, 2015 FINDINGS: No edema or consolidation. The heart size and pulmonary vascularity are normal. No adenopathy. No bone lesions. IMPRESSION: No edema or consolidation. Electronically Signed   By: Lowella Grip III M.D.   On: 12/31/2015 09:34   Mr Brain Ltd W/o Cm  12/31/2015  CLINICAL DATA:  Episode of expressive a aphasia upon waking today. Symptoms have since improved. Stroke like symptoms. EXAM: MRI HEAD WITHOUT CONTRAST TECHNIQUE: Multiplanar, multiecho pulse sequences of the brain and surrounding structures were obtained without intravenous contrast. COMPARISON:  CT head without contrast 12/31/2015. MRI brain 03/03/2014. FINDINGS: The diffusion-weighted images demonstrate no evidence for acute or subacute infarct. There is no hemorrhage or mass lesion. The ventricles are of normal size. No significant extraaxial fluid collection is present. The internal auditory canals are within normal limits bilaterally. The brainstem and cerebellum are within normal limits. Note is made of a relatively empty sella. Flow is present in the major intracranial arteries. The globes and orbits are intact. The paranasal sinuses and right mastoid air cells are clear. There is some fluid posteriorly in the left mastoid air cells. No obstructing nasopharyngeal lesion is evident. IMPRESSION: 1. Normal MRI appearance of the brain. No acute or focal abnormality to explain the patient's a phasic episode. 2. Relatively empty sella  there this is unlikely be of consequence to the patient. 3. Minimal left mastoid effusion. No obstructing nasopharyngeal lesion is present. Electronically Signed   By: San Morelle M.D.   On: 12/31/2015 10:38    Scheduled Meds: .  stroke: mapping our early stages of recovery book   Does not apply Once  . aliskiren  300 mg Oral Daily  . aspirin  300 mg Rectal Daily   Or  . aspirin  325 mg Oral Daily  . enoxaparin (LOVENOX) injection  40 mg Subcutaneous Q24H  . fluticasone furoate-vilanterol  1 puff Inhalation Daily  . gabapentin  300 mg Oral TID  . ipratropium  2 spray Each Nare QID  . loratadine  10 mg Oral Daily  . montelukast  10 mg Oral QHS   Continuous Infusions:   Time spent: > 35 minutes  Velvet Bathe  [facility] Pager [phone]. If 7PM-7AM, please contact night-coverage at [email or web address], password [facility] 01/01/2016, 12:37 PM

## 2016-01-01 NOTE — Progress Notes (Signed)
Interval History:                                                                                                                      Alison Richards is an 65 y.o. female patient who is admitted for TIA workup due to some transient symptoms of speech problems yesterday, as described in the neurology consultation note. Her neurologic examination is nonfocal during my evaluation yesterday. She is remaining is symptomatic after admission. No neurology symptoms. Stroke workup has revealed negative brain MRI with no evidence of acute stroke, CT angiogram of the head and neck showed no evidence of significant focal vascular stenosis. On cardiac telemetry, no A. Fib.  Echocardiogram is pending. Lipid profile showed LDL of 108, total cholesterol 178. On asa now.   Past Medical History: Past Medical History  Diagnosis Date  . Palpitations   . Orthostatic lightheadedness   . Pain in joint   . Hypertension   . Asthma     PAST HX OF ASTHMA WHILE TAKING LISINOPRIL--NO PROBLEMS SINCE DISCONTINUING LISINOPRIL  . Shortness of breath     WITH EXERTION-PT RELATES TO HER WEIGHT  . Obstructive sleep apnea     CPAP EVERY NIGHT-SETTING IS 8  . Blood transfusion     AGE 89 -HEMORRHAGED AFTER TONSILLECTOMY  . Protein in urine   . GERD (gastroesophageal reflux disease)     PAST HX  H-PYLORI   . H/O hiatal hernia   . Arthritis     PAIN AND OA RIGHT KNEE  . PONV (postoperative nausea and vomiting)   . Obesity   . H. pylori infection     Hx of   . Hyperlipemia   . Kidney stones   . Wears dentures     full top  . Wears glasses     Past Surgical History  Procedure Laterality Date  . Bladder surgery    . Tubal ligation  1979  . Cesarean section  1979  . Abdominal hysterectomy  1999  . Neck sx for ruptured disc c4-5  2001  . Tonsillectomy  age 50  . Cholecystectomy  3-08    Dr Lennie Hummer  . Left knee replacement  2008  . Total knee arthroplasty  11/14/2011    Procedure: TOTAL KNEE  ARTHROPLASTY;  Surgeon: Mauri Pole, MD;  Location: [facility];  Service: Orthopedics;  Laterality: Right;  combined with general block  . Hand surgery      Bilateral   . Esophageal manometry  09/02/2012    Procedure: ESOPHAGEAL MANOMETRY (EM);  Surgeon: Sable Feil, MD;  Location: [facility];  Service: Endoscopy;  Laterality: N/A;  . Colonoscopy    . Trigger finger release Right 12/25/2013    Procedure: RIGHT LONG TRIGGER RELEASE ;  Surgeon: Tennis Must, MD;  Location: [facility];  Service: Orthopedics;  Laterality: Right;    Family History: Family History  Problem Relation Age of Onset  . Alzheimer's disease Mother 29  .  Hypertension Mother   . Hyperlipidemia Mother   . Alcohol abuse Father   . Cancer Father   . COPD Father   . Colon cancer Neg Hx   . Lung cancer Father   . Diverticulitis Mother   . Skin cancer Father   . Prostate cancer Father     Social History:   reports that she has never smoked. She has never used smokeless tobacco. She reports that she does not drink alcohol or use illicit drugs.  Allergies:  Allergies  Allergen Reactions  . Dilaudid [Hydromorphone Hcl] Anaphylaxis and Swelling    Throat closing up  . Hydromorphone Anaphylaxis and Shortness Of Breath    Pt had a tightening in her throat  . Acetaminophen Other (See Comments)    hallucinations, cold sweats, headache, metallic taste, tongue swelling  . Angiotensin Receptor Blockers Other (See Comments)    Joint aches  . Erythromycin Ethylsuccinate Diarrhea    gi symptoms, stomach swelling  . Lisinopril Other (See Comments)    ASTHMA LIKE SYMPTOMS, coughing  . Losartan Potassium-Hctz     REACTION: JOINTS aching, hurting all over  . Morphine Sulfate Nausea And Vomiting    Projectile vomiting  . Valsartan     REACTION: Hurting all over  . Latex Itching and Rash    SENSITIVE TO SOME ADHESIVES.  . Sulfa Antibiotics Swelling and Rash    Eyes swelled shut  . Sulfonamide  Derivatives Swelling and Rash  . Tape Rash    Red and bruising, Please use "paper" tape only     Medications:                                                                                                                         Current facility-administered medications:  .   stroke: mapping our early stages of recovery book, , Does not apply, Once, Samella Parr, NP .  albuterol (PROVENTIL) (2.5 MG/3ML) 0.083% nebulizer solution 2.5 mg, 2.5 mg, Nebulization, Q6H PRN, Samella Parr, NP .  [facility]) tablet 300 mg, 300 mg, Oral, Daily, Samella Parr, NP, 300 mg at 01/01/16 1003 .  aspirin suppository 300 mg, 300 mg, Rectal, Daily **OR** aspirin tablet 325 mg, 325 mg, Oral, Daily, Samella Parr, NP, 325 mg at 01/01/16 1003 .  enoxaparin (LOVENOX) injection 40 mg, 40 mg, Subcutaneous, Q24H, Samella Parr, NP, 40 mg at 01/01/16 2106 .  fluticasone furoate-vilanterol (BREO ELLIPTA) 100-25 MCG/INH 1 puff, 1 puff, Inhalation, Daily, Samella Parr, NP, 1 puff at 01/01/16 0849 .  gabapentin (NEURONTIN) capsule 300 mg, 300 mg, Oral, TID, Samella Parr, NP, 300 mg at 01/01/16 2105 .  ibuprofen (ADVIL,MOTRIN) tablet 200-400 mg, 200-400 mg, Oral, Q6H PRN, Samella Parr, NP, 400 mg at 01/01/16 0531 .  ipratropium (ATROVENT) 0.06 % nasal spray 2 spray, 2 spray, Each Nare, QID, Samella Parr, NP, 2 spray at 01/01/16 1314 .  ketorolac (TORADOL) 15 MG/ML injection 15 mg, 15  mg, Intravenous, Q6H PRN, Samella Parr, NP .  loratadine (CLARITIN) tablet 10 mg, 10 mg, Oral, Daily, Samella Parr, NP, 10 mg at 01/01/16 1003 .  loratadine (CLARITIN) tablet 10 mg, 10 mg, Oral, Daily PRN, Samella Parr, NP .  montelukast (SINGULAIR) tablet 10 mg, 10 mg, Oral, QHS, Samella Parr, NP, 10 mg at 01/01/16 2106 .  pravastatin (PRAVACHOL) tablet 20 mg, 20 mg, Oral, q1800, Velvet Bathe, MD, 20 mg at 01/01/16 1707   Neurologic Examination:                                                                                                       Blood pressure 117/76, pulse 89, temperature 98.3 F (36.8 C), temperature source Oral, resp. rate 18, height 5\' 4"  (1.626 m), weight 131.679 kg (290 lb 4.8 oz), SpO2 98 %.  Evaluation of higher integrative functions including: Level of alertness: Alert,  Oriented to time, place and person Attention span and concentration  - intact   Speech: fluent, no evidence of dysarthria or aphasia noted.  Test the following cranial nerves: 2-12 grossly intact Motor examination: Normal tone, bulk, full 5/5 motor strength in all 4 extremities Examination of sensation : Normal and symmetric sensation to pinprick in all 4 extremities and on face Examination of deep tendon reflexes: 2+, normal and symmetric in all extremities, normal plantars bilaterally Test coordination: Normal finger nose testing, with no evidence of limb appendicular ataxia or abnormal involuntary movements or tremors noted.  Gait: Deferred   Lab Results: Basic Metabolic Panel:  Recent Labs Lab 12/31/15 0849 12/31/15 0855  NA 142 142  K 3.6 3.8  CL 104 104  CO2 22  --   GLUCOSE 104* 102*  BUN 15 17  CREATININE 0.84 0.70  CALCIUM 9.6  --     Liver Function Tests: No results for input(s): AST, ALT, ALKPHOS, BILITOT, PROT, ALBUMIN in the last 168 hours. No results for input(s): LIPASE, AMYLASE in the last 168 hours. No results for input(s): AMMONIA in the last 168 hours.  CBC:  Recent Labs Lab 12/31/15 0849 12/31/15 0855 12/31/15 0912  WBC 9.9  --   --   NEUTROABS  --   --  5.6  HGB 14.3 15.3*  --   HCT 42.2 45.0  --   MCV 90.4  --   --   PLT 274  --   --     Cardiac Enzymes:  Recent Labs Lab 12/31/15 2036  TROPONINI <0.03    Lipid Panel:  Recent Labs Lab 01/01/16 0520  CHOL 178  TRIG 142  HDL 42  CHOLHDL 4.2  VLDL 28  LDLCALC 108*    CBG:  Recent Labs Lab 12/31/15 0846  GLUCAP 105*    Microbiology: Results for orders placed or  performed in visit on [id number]  Helicobacter pylori special antigen     Status: None   Collection Time: 07/17/12 12:00 AM  Result Value Ref Range Status   H. PYLORI Antigen  NEGATIVE  Final   H. PYLORI Antigen  Antimicrobials,proton pump  inhibitors and bismuth  Final   H. PYLORI Antigen    Final    preparations are known to suppress H.pylori and ingestion of   H. PYLORI Antigen    Final    these prior to testing may cause a false negative result. If   H. PYLORI Antigen    Final    a negative result is obtained for a patient that has    H. PYLORI Antigen  ingested these compounds within two weeks prior of  Final   H. PYLORI Antigen    Final    performing the H.pylori test, results may be falsely   H. PYLORI Antigen    Final    negative and should be repeated with a new specimen two   H. PYLORI Antigen  weeks after discontinuing treatment.  Final    Imaging: Ct Angio Head W/cm &/or Wo Cm  12/31/2015  CLINICAL DATA:  Episode of a aphasia today. The patient appeared cyanotic. Symptoms have since resolved. EXAM: CT ANGIOGRAPHY HEAD AND NECK TECHNIQUE: Multidetector CT imaging of the head and neck was performed using the standard protocol during bolus administration of intravenous contrast. Multiplanar CT image reconstructions and MIPs were obtained to evaluate the vascular anatomy. Carotid stenosis measurements (when applicable) are obtained utilizing NASCET criteria, using the distal internal carotid diameter as the denominator. CONTRAST:  73mL OMNIPAQUE IOHEXOL 350 MG/ML SOLN COMPARISON:  MRI of the brain from the same day. FINDINGS: CTA NECK Aortic arch: A 3 vessel arch configuration is present. No significant atherosclerotic calcifications are present at the aortic arch. There is no significant stenosis at the origin of the great vessels. Right carotid system: The right common carotid artery is within normal limits. The carotid bifurcation is within normal limits. There is mild tortuosity of the  cervical right ICA without significant stenosis. Left carotid system: The left common carotid artery is within normal limits. The bifurcation is within normal limits. Mild tortuosity is present in the cervical left ICA without significant stenosis. Vertebral arteries:Both vertebral arteries originate from the subclavian arteries without focal stenosis. There is no significant stenosis or focal vessel injury within the cervical portions of the vertebral arteries. Skeleton: Anterior cervical fusion is present at C4-5 and C5-6. Vertebral body heights and alignment are maintained. No other focal lesions are present. Other neck: No focal mucosal or submucosal lesions are present. Intra parotid lymph nodes are evident bilaterally. The salivary glands are otherwise within normal limits. There is no significant adenopathy. The thyroid is unremarkable. The lung apices are clear. The superior mediastinum is within normal limits. CTA HEAD Anterior circulation: The internal carotid arteries are within normal limits from the high cervical segments through the ICA termini bilaterally. The right internal carotid artery is of slightly larger caliber than the left. The right A1 segment is dominant. The anterior communicating artery is patent. Both A2 segments fill normally. There is a fenestration at the proximal right M1 segment without associated aneurysm. The MCA bifurcations are within normal limits bilaterally. The ACA and MCA branch vessels are within normal limits. Posterior circulation: The PICA origins are visualized and normal bilaterally. The vertebrobasilar junction is normal. The basilar artery is within normal limits. Both posterior cerebral arteries originate from the basilar tip. The PCA branch vessels are within normal limits. Venous sinuses: The dural sinuses are patent. The right transverse sinus is dominant. The straight sinus and deep cerebral veins are patent. The cortical veins are within normal limits.  Anatomic variants: None Delayed phase:  The postcontrast images demonstrate no pathologic enhancement. IMPRESSION: 1. Mild tortuosity of the cervical internal carotid arteries bilaterally without significant stenosis. 2. No significant focal stenosis or vascular pathology in the neck. 3. Normal variant MRA circle of Willis without significant proximal stenosis, aneurysm, or branch vessel occlusion. 4. C4-5 and C5-6 anterior cervical fusion without radiographic evidence for complication. Electronically Signed   By: San Morelle M.D.   On: 12/31/2015 11:26   Ct Head Wo Contrast  12/31/2015  CLINICAL DATA:  Unsteady gait and garbled speech EXAM: CT HEAD WITHOUT CONTRAST TECHNIQUE: Contiguous axial images were obtained from the base of the skull through the vertex without intravenous contrast. COMPARISON:  03/03/2014 FINDINGS: The bony calvarium is intact. The ventricles are of normal size and configuration. No findings to suggest acute hemorrhage, acute infarction or space-occupying mass lesion are noted. IMPRESSION: No acute abnormality noted. These results were called by telephone at the time of interpretation on 12/31/2015 at 9:07 am to Dr. Silverio Decamp, who verbally acknowledged these results. Electronically Signed   By: Inez Catalina M.D.   On: 12/31/2015 09:09   Ct Angio Neck W/cm &/or Wo/cm  12/31/2015  CLINICAL DATA:  Episode of a aphasia today. The patient appeared cyanotic. Symptoms have since resolved. EXAM: CT ANGIOGRAPHY HEAD AND NECK TECHNIQUE: Multidetector CT imaging of the head and neck was performed using the standard protocol during bolus administration of intravenous contrast. Multiplanar CT image reconstructions and MIPs were obtained to evaluate the vascular anatomy. Carotid stenosis measurements (when applicable) are obtained utilizing NASCET criteria, using the distal internal carotid diameter as the denominator. CONTRAST:  25mL OMNIPAQUE IOHEXOL 350 MG/ML SOLN COMPARISON:  MRI of the brain  from the same day. FINDINGS: CTA NECK Aortic arch: A 3 vessel arch configuration is present. No significant atherosclerotic calcifications are present at the aortic arch. There is no significant stenosis at the origin of the great vessels. Right carotid system: The right common carotid artery is within normal limits. The carotid bifurcation is within normal limits. There is mild tortuosity of the cervical right ICA without significant stenosis. Left carotid system: The left common carotid artery is within normal limits. The bifurcation is within normal limits. Mild tortuosity is present in the cervical left ICA without significant stenosis. Vertebral arteries:Both vertebral arteries originate from the subclavian arteries without focal stenosis. There is no significant stenosis or focal vessel injury within the cervical portions of the vertebral arteries. Skeleton: Anterior cervical fusion is present at C4-5 and C5-6. Vertebral body heights and alignment are maintained. No other focal lesions are present. Other neck: No focal mucosal or submucosal lesions are present. Intra parotid lymph nodes are evident bilaterally. The salivary glands are otherwise within normal limits. There is no significant adenopathy. The thyroid is unremarkable. The lung apices are clear. The superior mediastinum is within normal limits. CTA HEAD Anterior circulation: The internal carotid arteries are within normal limits from the high cervical segments through the ICA termini bilaterally. The right internal carotid artery is of slightly larger caliber than the left. The right A1 segment is dominant. The anterior communicating artery is patent. Both A2 segments fill normally. There is a fenestration at the proximal right M1 segment without associated aneurysm. The MCA bifurcations are within normal limits bilaterally. The ACA and MCA branch vessels are within normal limits. Posterior circulation: The PICA origins are visualized and normal  bilaterally. The vertebrobasilar junction is normal. The basilar artery is within normal limits. Both posterior cerebral arteries originate from the basilar  tip. The PCA branch vessels are within normal limits. Venous sinuses: The dural sinuses are patent. The right transverse sinus is dominant. The straight sinus and deep cerebral veins are patent. The cortical veins are within normal limits. Anatomic variants: None Delayed phase: The postcontrast images demonstrate no pathologic enhancement. IMPRESSION: 1. Mild tortuosity of the cervical internal carotid arteries bilaterally without significant stenosis. 2. No significant focal stenosis or vascular pathology in the neck. 3. Normal variant MRA circle of Willis without significant proximal stenosis, aneurysm, or branch vessel occlusion. 4. C4-5 and C5-6 anterior cervical fusion without radiographic evidence for complication. Electronically Signed   By: San Morelle M.D.   On: 12/31/2015 11:26   Ct Angio Chest Pe W/cm &/or Wo Cm  12/31/2015  CLINICAL DATA:  Shortness of breath and hypertension EXAM: CT ANGIOGRAPHY CHEST WITH CONTRAST TECHNIQUE: Multidetector CT imaging of the chest was performed using the standard protocol during bolus administration of intravenous contrast. Multiplanar CT image reconstructions and MIPs were obtained to evaluate the vascular anatomy. CONTRAST:  32mL OMNIPAQUE IOHEXOL 350 MG/ML SOLN COMPARISON:  Chest radiograph December 31, 2015 FINDINGS: Mediastinum/Lymph Nodes: There is no demonstrable pulmonary embolus. There is no thoracic aortic aneurysm or dissection. The visualized great vessels appear unremarkable. Visualized thyroid appears unremarkable. There are small mediastinal lymph nodes which do not meet size criteria for pathologic significance. There is no adenopathy by size criteria. Pericardium is not thickened. There is mild left ventricular hypertrophy. There is a small hiatal hernia. Lungs/Pleura: There is patchy  bibasilar atelectasis. There is no edema or consolidation. There is mild lower lobe bronchiectatic change bilaterally. Upper abdomen: Visualized upper abdominal structures are normal. Musculoskeletal: There is degenerative change in the lower thoracic region. There are no blastic or lytic bone lesions. Review of the MIP images confirms the above findings. IMPRESSION: No demonstrable pulmonary embolus. Patchy bibasilar atelectasis. Mild lower lobe bronchiectatic change. No lung edema or consolidation. No adenopathy. Mild left ventricular hypertrophy. Small hiatal hernia. Electronically Signed   By: Lowella Grip III M.D.   On: 12/31/2015 14:24   Dg Chest Portable 1 View  12/31/2015  CLINICAL DATA:  Shortness of Breath EXAM: PORTABLE CHEST 1 VIEW COMPARISON:  June 28, 2015 FINDINGS: No edema or consolidation. The heart size and pulmonary vascularity are normal. No adenopathy. No bone lesions. IMPRESSION: No edema or consolidation. Electronically Signed   By: Lowella Grip III M.D.   On: 12/31/2015 09:34   Mr Brain Ltd W/o Cm  12/31/2015  CLINICAL DATA:  Episode of expressive a aphasia upon waking today. Symptoms have since improved. Stroke like symptoms. EXAM: MRI HEAD WITHOUT CONTRAST TECHNIQUE: Multiplanar, multiecho pulse sequences of the brain and surrounding structures were obtained without intravenous contrast. COMPARISON:  CT head without contrast 12/31/2015. MRI brain 03/03/2014. FINDINGS: The diffusion-weighted images demonstrate no evidence for acute or subacute infarct. There is no hemorrhage or mass lesion. The ventricles are of normal size. No significant extraaxial fluid collection is present. The internal auditory canals are within normal limits bilaterally. The brainstem and cerebellum are within normal limits. Note is made of a relatively empty sella. Flow is present in the major intracranial arteries. The globes and orbits are intact. The paranasal sinuses and right mastoid air cells  are clear. There is some fluid posteriorly in the left mastoid air cells. No obstructing nasopharyngeal lesion is evident. IMPRESSION: 1. Normal MRI appearance of the brain. No acute or focal abnormality to explain the patient's a phasic episode. 2. Relatively empty sella  there this is unlikely be of consequence to the patient. 3. Minimal left mastoid effusion. No obstructing nasopharyngeal lesion is present. Electronically Signed   By: San Morelle M.D.   On: 12/31/2015 10:38    Assessment and plan:   Alison Richards is an 65 y.o. female patient  admitted for TIA workup as described. Negative brain MRI and CT imaging of the head and neck. Echocardiogram pending. Nonfocal neurological examination no new neurologic symptoms. On aspirin 325 g daily. Can be discharged home after completing the echocardiogram. No further conditions from neurology standpoint.  We'll sign off. Please call for any further questions.

## 2016-01-02 ENCOUNTER — Observation Stay (HOSPITAL_BASED_OUTPATIENT_CLINIC_OR_DEPARTMENT_OTHER): Payer: 59

## 2016-01-02 DIAGNOSIS — R600 Localized edema: Secondary | ICD-10-CM

## 2016-01-02 DIAGNOSIS — G459 Transient cerebral ischemic attack, unspecified: Secondary | ICD-10-CM

## 2016-01-02 DIAGNOSIS — I10 Essential (primary) hypertension: Secondary | ICD-10-CM | POA: Diagnosis not present

## 2016-01-02 DIAGNOSIS — R2990 Unspecified symptoms and signs involving the nervous system: Secondary | ICD-10-CM | POA: Diagnosis not present

## 2016-01-02 DIAGNOSIS — G5602 Carpal tunnel syndrome, left upper limb: Secondary | ICD-10-CM | POA: Insufficient documentation

## 2016-01-02 MED ORDER — GABAPENTIN 100 MG PO CAPS: 100.0000 mg | ORAL_CAPSULE | Freq: Two times a day (BID) | ORAL | Status: DC

## 2016-01-02 MED ORDER — ASPIRIN 325 MG PO TABS: 325.0000 mg | ORAL_TABLET | Freq: Every day | ORAL | Status: DC

## 2016-01-02 MED ORDER — GABAPENTIN 300 MG PO CAPS: 600.0000 mg | ORAL_CAPSULE | Freq: Every evening | ORAL | Status: DC

## 2016-01-02 NOTE — Progress Notes (Signed)
  Echocardiogram 2D Echocardiogram has been performed.  Darlina Sicilian M 01/02/2016, 11:27 AM

## 2016-01-02 NOTE — Progress Notes (Signed)
Discharge orders received. Pt and spouse educated on discharge instructions and stroke education. Pt verbalized understanding. Pt given discharge packet and prescription. IV and tele removed. Pt refused wheelchair. Pt walked downstairs with staff.

## 2016-01-02 NOTE — Discharge Summary (Signed)
Physician Discharge Summary  Alison Richards [id number] DOB: 05/13/1951 DOA: 12/31/2015  PCP: Beatrice Lecher, MD  Admit date: 12/31/2015 Discharge date: 01/02/2016  Time spent: > 35 minutes  Recommendations for Outpatient Follow-up:  Monitor blood pressures and adjust antihypertensive regimen accordingly   Discharge Diagnoses:  Principal Problem:   TIA (transient ischemic attack) Active Problems:   OSA on CPAP   HYPERTENSION, BENIGN SYSTEMIC   LEG EDEMA, BILATERAL   Hyperlipidemia   Left lumbar radiculitis   Peripheral neuropathy (HCC)   Acute dyspnea   Asthma   Morbid obesity with BMI of 50.0-59.9, adult (HCC)   Stroke-like symptoms   Discharge Condition: Stable  Diet recommendation: Heart healthy/low sodium  Filed Weights   12/31/15 2100  Weight: 131.679 kg (290 lb 4.8 oz)    History of present illness:  65 year old female who presented to the hospital with strokelike symptoms which she reported as difficulty forming words. Was admitted for work up for stroke  Hospital Course:  TIA work up - Work up for stroke was negative patient was evaluated by neurology and patient had stroke workup which was negative. Neurology recommended the following: Alison Richards is an 65 y.o. female patient admitted for TIA workup as described. Negative brain MRI and CT imaging of the head and neck. Echocardiogram pending. Nonfocal neurological examination no new neurologic symptoms. On aspirin 325 g daily. Can be discharged home after completing the echocardiogram. No further conditions from neurology standpoint.  For other known medical conditions listed above will continue medication regimen listed below  Procedures:  Please refer to EMR and below  Consultations:  Neurology  Discharge Exam: Filed Vitals:   01/02/16 0521 01/02/16 0956  BP: 123/58 126/75  Pulse: 74 80  Temp: 98.2 F (36.8 C) 98.2 F (36.8 C)  Resp: 20 18    General: Pt in nad, alert and  awake Cardiovascular: rrr, no mrg Respiratory: cta bl, no wheezes  Discharge Instructions   Discharge Instructions    Call MD for:  extreme fatigue    Complete by:  As directed      Call MD for:  temperature >100.4    Complete by:  As directed      Diet - low sodium heart healthy    Complete by:  As directed      Discharge instructions    Complete by:  As directed   *Follow-up with your primary care physician within the next one or 2 weeks or sooner should any new concerns arise.     Increase activity slowly    Complete by:  As directed           Current Discharge Medication List    START taking these medications   Details  aspirin 325 MG tablet Take 1 tablet (325 mg total) by mouth daily. Qty: 30 tablet, Refills: 0      CONTINUE these medications which have NOT CHANGED   Details  albuterol (PROVENTIL HFA;VENTOLIN HFA) 108 (90 BASE) MCG/ACT inhaler Inhale 2 puffs into the lungs every 6 (six) hours as needed for wheezing. Qty: 1 Inhaler, Refills: 1    Aliskiren-Hydrochlorothiazide (TEKTURNA HCT) 300-12.5 MG TABS Take 1 tablet by mouth daily before breakfast.     !! AMBULATORY NON FORMULARY MEDICATION CPAP Machine Uses at bedtime as directed    !! AMBULATORY NON FORMULARY MEDICATION Medication Name: CPAP hose replacement Dx: OSA  Dx code: G74.33 Qty: 1 each, Refills: 0    diazepam (VALIUM) 5 MG tablet Take 1 tab PO  1 hour before procedure or imaging. Qty: 2 tablet, Refills: 0   Associated Diagnoses: Left lumbar radiculitis    fluconazole (DIFLUCAN) 150 MG tablet Take 1 tablet (150 mg total) by mouth once. Qty: 1 tablet, Refills: 0    fluticasone furoate-vilanterol (BREO ELLIPTA) 100-25 MCG/INH AEPB Inhale 1 puff into the lungs daily. Qty: 3 each, Refills: 3    gabapentin (NEURONTIN) 300 MG capsule Take 300 mg by mouth See admin instructions. 300mg  at bedtime for one week, then 300mg  twice daily for one week, then 300mg  three times daily starting 3/2     hydrochlorothiazide (HYDRODIURIL) 25 MG tablet Take 12.5 mg by mouth daily.    ipratropium (ATROVENT) 0.06 % nasal spray Place 2 sprays into both nostrils 4 (four) times daily. Qty: 15 mL, Refills: 1    loratadine (CLARITIN) 10 MG tablet Take 10 mg by mouth daily as needed for allergies.    Menthol, Topical Analgesic, (BIOFREEZE EX) Apply 1 application topically daily as needed (for pain).    montelukast (SINGULAIR) 10 MG tablet Take 1 tablet (10 mg total) by mouth at bedtime. Qty: 30 tablet, Refills: 5   Associated Diagnoses: Asthma with acute exacerbation, moderate persistent    naproxen sodium (ANAPROX) 220 MG tablet Take 220-440 mg by mouth 2 (two) times daily as needed (for pain).    polyethylene glycol powder (GLYCOLAX/MIRALAX) powder Take 17 g by mouth daily as needed for mild constipation.      !! - Potential duplicate medications found. Please discuss with provider.    STOP taking these medications     cetirizine (ZYRTEC) 10 MG tablet        Allergies  Allergen Reactions  . Dilaudid [Hydromorphone Hcl] Anaphylaxis and Swelling    Throat closing up  . Hydromorphone Anaphylaxis and Shortness Of Breath    Pt had a tightening in her throat  . Acetaminophen Other (See Comments)    hallucinations, cold sweats, headache, metallic taste, tongue swelling  . Angiotensin Receptor Blockers Other (See Comments)    Joint aches  . Erythromycin Ethylsuccinate Diarrhea    gi symptoms, stomach swelling  . Lisinopril Other (See Comments)    ASTHMA LIKE SYMPTOMS, coughing  . Losartan Potassium-Hctz     REACTION: JOINTS aching, hurting all over  . Morphine Sulfate Nausea And Vomiting    Projectile vomiting  . Valsartan     REACTION: Hurting all over  . Latex Itching and Rash    SENSITIVE TO SOME ADHESIVES.  . Sulfa Antibiotics Swelling and Rash    Eyes swelled shut  . Sulfonamide Derivatives Swelling and Rash  . Tape Rash    Red and bruising, Please use "paper" tape only       The results of significant diagnostics from this hospitalization (including imaging, microbiology, ancillary and laboratory) are listed below for reference.    Significant Diagnostic Studies: Ct Angio Head W/cm &/or Wo Cm  12/31/2015  CLINICAL DATA:  Episode of a aphasia today. The patient appeared cyanotic. Symptoms have since resolved. EXAM: CT ANGIOGRAPHY HEAD AND NECK TECHNIQUE: Multidetector CT imaging of the head and neck was performed using the standard protocol during bolus administration of intravenous contrast. Multiplanar CT image reconstructions and MIPs were obtained to evaluate the vascular anatomy. Carotid stenosis measurements (when applicable) are obtained utilizing NASCET criteria, using the distal internal carotid diameter as the denominator. CONTRAST:  16mL OMNIPAQUE IOHEXOL 350 MG/ML SOLN COMPARISON:  MRI of the brain from the same day. FINDINGS: CTA NECK Aortic arch: A  3 vessel arch configuration is present. No significant atherosclerotic calcifications are present at the aortic arch. There is no significant stenosis at the origin of the great vessels. Right carotid system: The right common carotid artery is within normal limits. The carotid bifurcation is within normal limits. There is mild tortuosity of the cervical right ICA without significant stenosis. Left carotid system: The left common carotid artery is within normal limits. The bifurcation is within normal limits. Mild tortuosity is present in the cervical left ICA without significant stenosis. Vertebral arteries:Both vertebral arteries originate from the subclavian arteries without focal stenosis. There is no significant stenosis or focal vessel injury within the cervical portions of the vertebral arteries. Skeleton: Anterior cervical fusion is present at C4-5 and C5-6. Vertebral body heights and alignment are maintained. No other focal lesions are present. Other neck: No focal mucosal or submucosal lesions are present.  Intra parotid lymph nodes are evident bilaterally. The salivary glands are otherwise within normal limits. There is no significant adenopathy. The thyroid is unremarkable. The lung apices are clear. The superior mediastinum is within normal limits. CTA HEAD Anterior circulation: The internal carotid arteries are within normal limits from the high cervical segments through the ICA termini bilaterally. The right internal carotid artery is of slightly larger caliber than the left. The right A1 segment is dominant. The anterior communicating artery is patent. Both A2 segments fill normally. There is a fenestration at the proximal right M1 segment without associated aneurysm. The MCA bifurcations are within normal limits bilaterally. The ACA and MCA branch vessels are within normal limits. Posterior circulation: The PICA origins are visualized and normal bilaterally. The vertebrobasilar junction is normal. The basilar artery is within normal limits. Both posterior cerebral arteries originate from the basilar tip. The PCA branch vessels are within normal limits. Venous sinuses: The dural sinuses are patent. The right transverse sinus is dominant. The straight sinus and deep cerebral veins are patent. The cortical veins are within normal limits. Anatomic variants: None Delayed phase: The postcontrast images demonstrate no pathologic enhancement. IMPRESSION: 1. Mild tortuosity of the cervical internal carotid arteries bilaterally without significant stenosis. 2. No significant focal stenosis or vascular pathology in the neck. 3. Normal variant MRA circle of Willis without significant proximal stenosis, aneurysm, or branch vessel occlusion. 4. C4-5 and C5-6 anterior cervical fusion without radiographic evidence for complication. Electronically Signed   By: San Morelle M.D.   On: 12/31/2015 11:26   Ct Head Wo Contrast  12/31/2015  CLINICAL DATA:  Unsteady gait and garbled speech EXAM: CT HEAD WITHOUT CONTRAST  TECHNIQUE: Contiguous axial images were obtained from the base of the skull through the vertex without intravenous contrast. COMPARISON:  03/03/2014 FINDINGS: The bony calvarium is intact. The ventricles are of normal size and configuration. No findings to suggest acute hemorrhage, acute infarction or space-occupying mass lesion are noted. IMPRESSION: No acute abnormality noted. These results were called by telephone at the time of interpretation on 12/31/2015 at 9:07 am to Dr. Silverio Decamp, who verbally acknowledged these results. Electronically Signed   By: Inez Catalina M.D.   On: 12/31/2015 09:09   Ct Angio Neck W/cm &/or Wo/cm  12/31/2015  CLINICAL DATA:  Episode of a aphasia today. The patient appeared cyanotic. Symptoms have since resolved. EXAM: CT ANGIOGRAPHY HEAD AND NECK TECHNIQUE: Multidetector CT imaging of the head and neck was performed using the standard protocol during bolus administration of intravenous contrast. Multiplanar CT image reconstructions and MIPs were obtained to evaluate the vascular anatomy.  Carotid stenosis measurements (when applicable) are obtained utilizing NASCET criteria, using the distal internal carotid diameter as the denominator. CONTRAST:  68mL OMNIPAQUE IOHEXOL 350 MG/ML SOLN COMPARISON:  MRI of the brain from the same day. FINDINGS: CTA NECK Aortic arch: A 3 vessel arch configuration is present. No significant atherosclerotic calcifications are present at the aortic arch. There is no significant stenosis at the origin of the great vessels. Right carotid system: The right common carotid artery is within normal limits. The carotid bifurcation is within normal limits. There is mild tortuosity of the cervical right ICA without significant stenosis. Left carotid system: The left common carotid artery is within normal limits. The bifurcation is within normal limits. Mild tortuosity is present in the cervical left ICA without significant stenosis. Vertebral arteries:Both vertebral  arteries originate from the subclavian arteries without focal stenosis. There is no significant stenosis or focal vessel injury within the cervical portions of the vertebral arteries. Skeleton: Anterior cervical fusion is present at C4-5 and C5-6. Vertebral body heights and alignment are maintained. No other focal lesions are present. Other neck: No focal mucosal or submucosal lesions are present. Intra parotid lymph nodes are evident bilaterally. The salivary glands are otherwise within normal limits. There is no significant adenopathy. The thyroid is unremarkable. The lung apices are clear. The superior mediastinum is within normal limits. CTA HEAD Anterior circulation: The internal carotid arteries are within normal limits from the high cervical segments through the ICA termini bilaterally. The right internal carotid artery is of slightly larger caliber than the left. The right A1 segment is dominant. The anterior communicating artery is patent. Both A2 segments fill normally. There is a fenestration at the proximal right M1 segment without associated aneurysm. The MCA bifurcations are within normal limits bilaterally. The ACA and MCA branch vessels are within normal limits. Posterior circulation: The PICA origins are visualized and normal bilaterally. The vertebrobasilar junction is normal. The basilar artery is within normal limits. Both posterior cerebral arteries originate from the basilar tip. The PCA branch vessels are within normal limits. Venous sinuses: The dural sinuses are patent. The right transverse sinus is dominant. The straight sinus and deep cerebral veins are patent. The cortical veins are within normal limits. Anatomic variants: None Delayed phase: The postcontrast images demonstrate no pathologic enhancement. IMPRESSION: 1. Mild tortuosity of the cervical internal carotid arteries bilaterally without significant stenosis. 2. No significant focal stenosis or vascular pathology in the neck. 3.  Normal variant MRA circle of Willis without significant proximal stenosis, aneurysm, or branch vessel occlusion. 4. C4-5 and C5-6 anterior cervical fusion without radiographic evidence for complication. Electronically Signed   By: San Morelle M.D.   On: 12/31/2015 11:26   Ct Angio Chest Pe W/cm &/or Wo Cm  12/31/2015  CLINICAL DATA:  Shortness of breath and hypertension EXAM: CT ANGIOGRAPHY CHEST WITH CONTRAST TECHNIQUE: Multidetector CT imaging of the chest was performed using the standard protocol during bolus administration of intravenous contrast. Multiplanar CT image reconstructions and MIPs were obtained to evaluate the vascular anatomy. CONTRAST:  70mL OMNIPAQUE IOHEXOL 350 MG/ML SOLN COMPARISON:  Chest radiograph December 31, 2015 FINDINGS: Mediastinum/Lymph Nodes: There is no demonstrable pulmonary embolus. There is no thoracic aortic aneurysm or dissection. The visualized great vessels appear unremarkable. Visualized thyroid appears unremarkable. There are small mediastinal lymph nodes which do not meet size criteria for pathologic significance. There is no adenopathy by size criteria. Pericardium is not thickened. There is mild left ventricular hypertrophy. There is a small hiatal  hernia. Lungs/Pleura: There is patchy bibasilar atelectasis. There is no edema or consolidation. There is mild lower lobe bronchiectatic change bilaterally. Upper abdomen: Visualized upper abdominal structures are normal. Musculoskeletal: There is degenerative change in the lower thoracic region. There are no blastic or lytic bone lesions. Review of the MIP images confirms the above findings. IMPRESSION: No demonstrable pulmonary embolus. Patchy bibasilar atelectasis. Mild lower lobe bronchiectatic change. No lung edema or consolidation. No adenopathy. Mild left ventricular hypertrophy. Small hiatal hernia. Electronically Signed   By: Lowella Grip III M.D.   On: 12/31/2015 14:24   Dg Chest Portable 1  View  12/31/2015  CLINICAL DATA:  Shortness of Breath EXAM: PORTABLE CHEST 1 VIEW COMPARISON:  June 28, 2015 FINDINGS: No edema or consolidation. The heart size and pulmonary vascularity are normal. No adenopathy. No bone lesions. IMPRESSION: No edema or consolidation. Electronically Signed   By: Lowella Grip III M.D.   On: 12/31/2015 09:34   Mr Brain Ltd W/o Cm  12/31/2015  CLINICAL DATA:  Episode of expressive a aphasia upon waking today. Symptoms have since improved. Stroke like symptoms. EXAM: MRI HEAD WITHOUT CONTRAST TECHNIQUE: Multiplanar, multiecho pulse sequences of the brain and surrounding structures were obtained without intravenous contrast. COMPARISON:  CT head without contrast 12/31/2015. MRI brain 03/03/2014. FINDINGS: The diffusion-weighted images demonstrate no evidence for acute or subacute infarct. There is no hemorrhage or mass lesion. The ventricles are of normal size. No significant extraaxial fluid collection is present. The internal auditory canals are within normal limits bilaterally. The brainstem and cerebellum are within normal limits. Note is made of a relatively empty sella. Flow is present in the major intracranial arteries. The globes and orbits are intact. The paranasal sinuses and right mastoid air cells are clear. There is some fluid posteriorly in the left mastoid air cells. No obstructing nasopharyngeal lesion is evident. IMPRESSION: 1. Normal MRI appearance of the brain. No acute or focal abnormality to explain the patient's a phasic episode. 2. Relatively empty sella there this is unlikely be of consequence to the patient. 3. Minimal left mastoid effusion. No obstructing nasopharyngeal lesion is present. Electronically Signed   By: San Morelle M.D.   On: 12/31/2015 10:38    Microbiology: No results found for this or any previous visit (from the past 240 hour(s)).   Labs: Basic Metabolic Panel:  Recent Labs Lab 12/31/15 0849 12/31/15 0855  NA 142  142  K 3.6 3.8  CL 104 104  CO2 22  --   GLUCOSE 104* 102*  BUN 15 17  CREATININE 0.84 0.70  CALCIUM 9.6  --    Liver Function Tests: No results for input(s): AST, ALT, ALKPHOS, BILITOT, PROT, ALBUMIN in the last 168 hours. No results for input(s): LIPASE, AMYLASE in the last 168 hours. No results for input(s): AMMONIA in the last 168 hours. CBC:  Recent Labs Lab 12/31/15 0849 12/31/15 0855 12/31/15 0912  WBC 9.9  --   --   NEUTROABS  --   --  5.6  HGB 14.3 15.3*  --   HCT 42.2 45.0  --   MCV 90.4  --   --   PLT 274  --   --    Cardiac Enzymes:  Recent Labs Lab 12/31/15 2036  TROPONINI <0.03   BNP: BNP (last 3 results)  Recent Labs  06/18/15 1929  BNP 15.0    ProBNP (last 3 results) No results for input(s): PROBNP in the last 8760 hours.  CBG:  Recent  Labs Lab 12/31/15 0846  GLUCAP 105*    Signed:  Velvet Bathe MD.  [facility] 01/02/2016, 3:54 PM

## 2016-01-02 NOTE — Progress Notes (Signed)
VASCULAR LAB PRELIMINARY  PRELIMINARY  PRELIMINARY  PRELIMINARY  Bilateral lower extremity venous duplex completed.    Preliminary report:  Bilateral:  No evidence of DVT, superficial thrombosis, or Baker's Cyst.   Shakari Qazi, RVS 01/02/2016, 2:13 PM

## 2016-01-02 NOTE — Progress Notes (Signed)
Interval History:                                                                                                                      Alison Richards is an 65 y.o. female patient  Admitted for TIA workup as described in my notes history. She had a negative brain MRI. She is awaiting echocardiogram.   This morning she noted some thinning paresthesias in her left hand predominantly involving the first 3 digits. When she reported this to the nurse, nurse called me for further evaluation due to new symptoms noted by her. Patient reported having history of bilateral carpal tunnel surgeries done and also cervical spinal fusion surgery done in the past. She denies any radicular pain symptoms at this time. Numbness and paresthesias are limited to the first 3 digits in the left hand. No weakness. The symptoms have improved now and they were noted early in the morning.   Due to some paresthesias in her feet , she was recently prescribed gabapentin 300 mg 3 times a day by her primary care physician for 3 days ago.  Due to fear of side effects, and she has just been taking bedtime dose of this medication. Denies any seizure considered to side effects.    Past Medical History: Past Medical History  Diagnosis Date  . Palpitations   . Orthostatic lightheadedness   . Pain in joint   . Hypertension   . Asthma     PAST HX OF ASTHMA WHILE TAKING LISINOPRIL--NO PROBLEMS SINCE DISCONTINUING LISINOPRIL  . Shortness of breath     WITH EXERTION-PT RELATES TO HER WEIGHT  . Obstructive sleep apnea     CPAP EVERY NIGHT-SETTING IS 8  . Blood transfusion     AGE 64 -HEMORRHAGED AFTER TONSILLECTOMY  . Protein in urine   . GERD (gastroesophageal reflux disease)     PAST HX  H-PYLORI   . H/O hiatal hernia   . Arthritis     PAIN AND OA RIGHT KNEE  . PONV (postoperative nausea and vomiting)   . Obesity   . H. pylori infection     Hx of   . Hyperlipemia   . Kidney stones   . Wears dentures     full top  .  Wears glasses     Past Surgical History  Procedure Laterality Date  . Bladder surgery    . Tubal ligation  1979  . Cesarean section  1979  . Abdominal hysterectomy  1999  . Neck sx for ruptured disc c4-5  2001  . Tonsillectomy  age 28  . Cholecystectomy  3-08    Dr Lennie Hummer  . Left knee replacement  2008  . Total knee arthroplasty  11/14/2011    Procedure: TOTAL KNEE ARTHROPLASTY;  Surgeon: Mauri Pole, MD;  Location: [facility];  Service: Orthopedics;  Laterality: Right;  combined with general block  . Hand surgery      Bilateral   . Esophageal manometry  09/02/2012  Procedure: ESOPHAGEAL MANOMETRY (EM);  Surgeon: Sable Feil, MD;  Location: [facility];  Service: Endoscopy;  Laterality: N/A;  . Colonoscopy    . Trigger finger release Right 12/25/2013    Procedure: RIGHT LONG TRIGGER RELEASE ;  Surgeon: Tennis Must, MD;  Location: [place];  Service: Orthopedics;  Laterality: Right;    Family History: Family History  Problem Relation Age of Onset  . Alzheimer's disease Mother 50  . Hypertension Mother   . Hyperlipidemia Mother   . Alcohol abuse Father   . Cancer Father   . COPD Father   . Colon cancer Neg Hx   . Lung cancer Father   . Diverticulitis Mother   . Skin cancer Father   . Prostate cancer Father     Social History:   reports that she has never smoked. She has never used smokeless tobacco. She reports that she does not drink alcohol or use illicit drugs.  Allergies:  Allergies  Allergen Reactions  . Dilaudid [Hydromorphone Hcl] Anaphylaxis and Swelling    Throat closing up  . Hydromorphone Anaphylaxis and Shortness Of Breath    Pt had a tightening in her throat  . Acetaminophen Other (See Comments)    hallucinations, cold sweats, headache, metallic taste, tongue swelling  . Angiotensin Receptor Blockers Other (See Comments)    Joint aches  . Erythromycin Ethylsuccinate Diarrhea    gi symptoms, stomach swelling  . Lisinopril  Other (See Comments)    ASTHMA LIKE SYMPTOMS, coughing  . Losartan Potassium-Hctz     REACTION: JOINTS aching, hurting all over  . Morphine Sulfate Nausea And Vomiting    Projectile vomiting  . Valsartan     REACTION: Hurting all over  . Latex Itching and Rash    SENSITIVE TO SOME ADHESIVES.  . Sulfa Antibiotics Swelling and Rash    Eyes swelled shut  . Sulfonamide Derivatives Swelling and Rash  . Tape Rash    Red and bruising, Please use "paper" tape only     Medications:                                                                                                                         Current facility-administered medications:  .  albuterol (PROVENTIL) (2.5 MG/3ML) 0.083% nebulizer solution 2.5 mg, 2.5 mg, Nebulization, Q6H PRN, Samella Parr, NP .  [facility]) tablet 300 mg, 300 mg, Oral, Daily, Samella Parr, NP, 300 mg at 01/02/16 0958 .  aspirin suppository 300 mg, 300 mg, Rectal, Daily **OR** aspirin tablet 325 mg, 325 mg, Oral, Daily, Samella Parr, NP, 325 mg at 01/02/16 0957 .  enoxaparin (LOVENOX) injection 40 mg, 40 mg, Subcutaneous, Q24H, Samella Parr, NP, 40 mg at 01/01/16 2106 .  fluticasone furoate-vilanterol (BREO ELLIPTA) 100-25 MCG/INH 1 puff, 1 puff, Inhalation, Daily, Samella Parr, NP, 1 puff at 01/02/16 0820 .  gabapentin (NEURONTIN) capsule 100 mg, 100 mg, Oral, BID, Kavonte Bearse [address] [place],  MD .  gabapentin (NEURONTIN) capsule 600 mg, 600 mg, Oral, QPM, Bassem Bernasconi Fuller Mandril, MD .  ibuprofen (ADVIL,MOTRIN) tablet 200-400 mg, 200-400 mg, Oral, Q6H PRN, Samella Parr, NP, 400 mg at 01/02/16 0744 .  ipratropium (ATROVENT) 0.06 % nasal spray 2 spray, 2 spray, Each Nare, QID, Samella Parr, NP, 2 spray at 01/01/16 1314 .  ketorolac (TORADOL) 15 MG/ML injection 15 mg, 15 mg, Intravenous, Q6H PRN, Samella Parr, NP .  loratadine (CLARITIN) tablet 10 mg, 10 mg, Oral, Daily, Samella Parr, NP, 10 mg at 01/02/16 0957 .   loratadine (CLARITIN) tablet 10 mg, 10 mg, Oral, Daily PRN, Samella Parr, NP .  montelukast (SINGULAIR) tablet 10 mg, 10 mg, Oral, QHS, Samella Parr, NP, 10 mg at 01/01/16 2106 .  pravastatin (PRAVACHOL) tablet 20 mg, 20 mg, Oral, q1800, Velvet Bathe, MD, 20 mg at 01/01/16 1707   Neurologic Examination:                                                                                                      Blood pressure 126/75, pulse 80, temperature 98.2 F (36.8 C), temperature source Oral, resp. rate 18, height 5\' 4"  (1.626 m), weight 131.679 kg (290 lb 4.8 oz), SpO2 97 %.  Evaluation of higher integrative functions including: Level of alertness: Alert,  Oriented to time, place and person Attention span and concentration  - intact   Speech: fluent, no evidence of dysarthria or aphasia noted.  Test the following cranial nerves: 2-12 grossly intact Motor examination: Normal tone, bulk, full 5/5 motor strength in all 4 extremities Examination of sensation :  Reduced sensation to pinprick in the first 3 digits in the left hand compared to the fifth digit, otherwise normal sensation Proximally.    Lab Results: Basic Metabolic Panel:  Recent Labs Lab 12/31/15 0849 12/31/15 0855  NA 142 142  K 3.6 3.8  CL 104 104  CO2 22  --   GLUCOSE 104* 102*  BUN 15 17  CREATININE 0.84 0.70  CALCIUM 9.6  --     Liver Function Tests: No results for input(s): AST, ALT, ALKPHOS, BILITOT, PROT, ALBUMIN in the last 168 hours. No results for input(s): LIPASE, AMYLASE in the last 168 hours. No results for input(s): AMMONIA in the last 168 hours.  CBC:  Recent Labs Lab 12/31/15 0849 12/31/15 0855 12/31/15 0912  WBC 9.9  --   --   NEUTROABS  --   --  5.6  HGB 14.3 15.3*  --   HCT 42.2 45.0  --   MCV 90.4  --   --   PLT 274  --   --     Cardiac Enzymes:  Recent Labs Lab 12/31/15 2036  TROPONINI <0.03    Lipid Panel:  Recent Labs Lab 01/01/16 0520  CHOL 178  TRIG 142  HDL  42  CHOLHDL 4.2  VLDL 28  LDLCALC 108*    CBG:  Recent Labs Lab 12/31/15 0846  GLUCAP 105*    Microbiology: Results for orders placed or performed  in visit on [id number]  Helicobacter pylori special antigen     Status: None   Collection Time: 07/17/12 12:00 AM  Result Value Ref Range Status   H. PYLORI Antigen  NEGATIVE  Final   H. PYLORI Antigen  Antimicrobials,proton pump inhibitors and bismuth  Final   H. PYLORI Antigen    Final    preparations are known to suppress H.pylori and ingestion of   H. PYLORI Antigen    Final    these prior to testing may cause a false negative result. If   H. PYLORI Antigen    Final    a negative result is obtained for a patient that has    H. PYLORI Antigen  ingested these compounds within two weeks prior of  Final   H. PYLORI Antigen    Final    performing the H.pylori test, results may be falsely   H. PYLORI Antigen    Final    negative and should be repeated with a new specimen two   H. PYLORI Antigen  weeks after discontinuing treatment.  Final    Imaging: Ct Angio Head W/cm &/or Wo Cm  12/31/2015  CLINICAL DATA:  Episode of a aphasia today. The patient appeared cyanotic. Symptoms have since resolved. EXAM: CT ANGIOGRAPHY HEAD AND NECK TECHNIQUE: Multidetector CT imaging of the head and neck was performed using the standard protocol during bolus administration of intravenous contrast. Multiplanar CT image reconstructions and MIPs were obtained to evaluate the vascular anatomy. Carotid stenosis measurements (when applicable) are obtained utilizing NASCET criteria, using the distal internal carotid diameter as the denominator. CONTRAST:  51mL OMNIPAQUE IOHEXOL 350 MG/ML SOLN COMPARISON:  MRI of the brain from the same day. FINDINGS: CTA NECK Aortic arch: A 3 vessel arch configuration is present. No significant atherosclerotic calcifications are present at the aortic arch. There is no significant stenosis at the origin of the great vessels. Right  carotid system: The right common carotid artery is within normal limits. The carotid bifurcation is within normal limits. There is mild tortuosity of the cervical right ICA without significant stenosis. Left carotid system: The left common carotid artery is within normal limits. The bifurcation is within normal limits. Mild tortuosity is present in the cervical left ICA without significant stenosis. Vertebral arteries:Both vertebral arteries originate from the subclavian arteries without focal stenosis. There is no significant stenosis or focal vessel injury within the cervical portions of the vertebral arteries. Skeleton: Anterior cervical fusion is present at C4-5 and C5-6. Vertebral body heights and alignment are maintained. No other focal lesions are present. Other neck: No focal mucosal or submucosal lesions are present. Intra parotid lymph nodes are evident bilaterally. The salivary glands are otherwise within normal limits. There is no significant adenopathy. The thyroid is unremarkable. The lung apices are clear. The superior mediastinum is within normal limits. CTA HEAD Anterior circulation: The internal carotid arteries are within normal limits from the high cervical segments through the ICA termini bilaterally. The right internal carotid artery is of slightly larger caliber than the left. The right A1 segment is dominant. The anterior communicating artery is patent. Both A2 segments fill normally. There is a fenestration at the proximal right M1 segment without associated aneurysm. The MCA bifurcations are within normal limits bilaterally. The ACA and MCA branch vessels are within normal limits. Posterior circulation: The PICA origins are visualized and normal bilaterally. The vertebrobasilar junction is normal. The basilar artery is within normal limits. Both posterior cerebral arteries originate from the  basilar tip. The PCA branch vessels are within normal limits. Venous sinuses: The dural sinuses are  patent. The right transverse sinus is dominant. The straight sinus and deep cerebral veins are patent. The cortical veins are within normal limits. Anatomic variants: None Delayed phase: The postcontrast images demonstrate no pathologic enhancement. IMPRESSION: 1. Mild tortuosity of the cervical internal carotid arteries bilaterally without significant stenosis. 2. No significant focal stenosis or vascular pathology in the neck. 3. Normal variant MRA circle of Willis without significant proximal stenosis, aneurysm, or branch vessel occlusion. 4. C4-5 and C5-6 anterior cervical fusion without radiographic evidence for complication. Electronically Signed   By: San Morelle M.D.   On: 12/31/2015 11:26   Ct Head Wo Contrast  12/31/2015  CLINICAL DATA:  Unsteady gait and garbled speech EXAM: CT HEAD WITHOUT CONTRAST TECHNIQUE: Contiguous axial images were obtained from the base of the skull through the vertex without intravenous contrast. COMPARISON:  03/03/2014 FINDINGS: The bony calvarium is intact. The ventricles are of normal size and configuration. No findings to suggest acute hemorrhage, acute infarction or space-occupying mass lesion are noted. IMPRESSION: No acute abnormality noted. These results were called by telephone at the time of interpretation on 12/31/2015 at 9:07 am to Dr. Silverio Decamp, who verbally acknowledged these results. Electronically Signed   By: Inez Catalina M.D.   On: 12/31/2015 09:09   Ct Angio Neck W/cm &/or Wo/cm  12/31/2015  CLINICAL DATA:  Episode of a aphasia today. The patient appeared cyanotic. Symptoms have since resolved. EXAM: CT ANGIOGRAPHY HEAD AND NECK TECHNIQUE: Multidetector CT imaging of the head and neck was performed using the standard protocol during bolus administration of intravenous contrast. Multiplanar CT image reconstructions and MIPs were obtained to evaluate the vascular anatomy. Carotid stenosis measurements (when applicable) are obtained utilizing NASCET  criteria, using the distal internal carotid diameter as the denominator. CONTRAST:  20mL OMNIPAQUE IOHEXOL 350 MG/ML SOLN COMPARISON:  MRI of the brain from the same day. FINDINGS: CTA NECK Aortic arch: A 3 vessel arch configuration is present. No significant atherosclerotic calcifications are present at the aortic arch. There is no significant stenosis at the origin of the great vessels. Right carotid system: The right common carotid artery is within normal limits. The carotid bifurcation is within normal limits. There is mild tortuosity of the cervical right ICA without significant stenosis. Left carotid system: The left common carotid artery is within normal limits. The bifurcation is within normal limits. Mild tortuosity is present in the cervical left ICA without significant stenosis. Vertebral arteries:Both vertebral arteries originate from the subclavian arteries without focal stenosis. There is no significant stenosis or focal vessel injury within the cervical portions of the vertebral arteries. Skeleton: Anterior cervical fusion is present at C4-5 and C5-6. Vertebral body heights and alignment are maintained. No other focal lesions are present. Other neck: No focal mucosal or submucosal lesions are present. Intra parotid lymph nodes are evident bilaterally. The salivary glands are otherwise within normal limits. There is no significant adenopathy. The thyroid is unremarkable. The lung apices are clear. The superior mediastinum is within normal limits. CTA HEAD Anterior circulation: The internal carotid arteries are within normal limits from the high cervical segments through the ICA termini bilaterally. The right internal carotid artery is of slightly larger caliber than the left. The right A1 segment is dominant. The anterior communicating artery is patent. Both A2 segments fill normally. There is a fenestration at the proximal right M1 segment without associated aneurysm. The MCA bifurcations are  within  normal limits bilaterally. The ACA and MCA branch vessels are within normal limits. Posterior circulation: The PICA origins are visualized and normal bilaterally. The vertebrobasilar junction is normal. The basilar artery is within normal limits. Both posterior cerebral arteries originate from the basilar tip. The PCA branch vessels are within normal limits. Venous sinuses: The dural sinuses are patent. The right transverse sinus is dominant. The straight sinus and deep cerebral veins are patent. The cortical veins are within normal limits. Anatomic variants: None Delayed phase: The postcontrast images demonstrate no pathologic enhancement. IMPRESSION: 1. Mild tortuosity of the cervical internal carotid arteries bilaterally without significant stenosis. 2. No significant focal stenosis or vascular pathology in the neck. 3. Normal variant MRA circle of Willis without significant proximal stenosis, aneurysm, or branch vessel occlusion. 4. C4-5 and C5-6 anterior cervical fusion without radiographic evidence for complication. Electronically Signed   By: San Morelle M.D.   On: 12/31/2015 11:26   Ct Angio Chest Pe W/cm &/or Wo Cm  12/31/2015  CLINICAL DATA:  Shortness of breath and hypertension EXAM: CT ANGIOGRAPHY CHEST WITH CONTRAST TECHNIQUE: Multidetector CT imaging of the chest was performed using the standard protocol during bolus administration of intravenous contrast. Multiplanar CT image reconstructions and MIPs were obtained to evaluate the vascular anatomy. CONTRAST:  16mL OMNIPAQUE IOHEXOL 350 MG/ML SOLN COMPARISON:  Chest radiograph December 31, 2015 FINDINGS: Mediastinum/Lymph Nodes: There is no demonstrable pulmonary embolus. There is no thoracic aortic aneurysm or dissection. The visualized great vessels appear unremarkable. Visualized thyroid appears unremarkable. There are small mediastinal lymph nodes which do not meet size criteria for pathologic significance. There is no adenopathy by size  criteria. Pericardium is not thickened. There is mild left ventricular hypertrophy. There is a small hiatal hernia. Lungs/Pleura: There is patchy bibasilar atelectasis. There is no edema or consolidation. There is mild lower lobe bronchiectatic change bilaterally. Upper abdomen: Visualized upper abdominal structures are normal. Musculoskeletal: There is degenerative change in the lower thoracic region. There are no blastic or lytic bone lesions. Review of the MIP images confirms the above findings. IMPRESSION: No demonstrable pulmonary embolus. Patchy bibasilar atelectasis. Mild lower lobe bronchiectatic change. No lung edema or consolidation. No adenopathy. Mild left ventricular hypertrophy. Small hiatal hernia. Electronically Signed   By: Lowella Grip III M.D.   On: 12/31/2015 14:24   Dg Chest Portable 1 View  12/31/2015  CLINICAL DATA:  Shortness of Breath EXAM: PORTABLE CHEST 1 VIEW COMPARISON:  June 28, 2015 FINDINGS: No edema or consolidation. The heart size and pulmonary vascularity are normal. No adenopathy. No bone lesions. IMPRESSION: No edema or consolidation. Electronically Signed   By: Lowella Grip III M.D.   On: 12/31/2015 09:34   Mr Brain Ltd W/o Cm  12/31/2015  CLINICAL DATA:  Episode of expressive a aphasia upon waking today. Symptoms have since improved. Stroke like symptoms. EXAM: MRI HEAD WITHOUT CONTRAST TECHNIQUE: Multiplanar, multiecho pulse sequences of the brain and surrounding structures were obtained without intravenous contrast. COMPARISON:  CT head without contrast 12/31/2015. MRI brain 03/03/2014. FINDINGS: The diffusion-weighted images demonstrate no evidence for acute or subacute infarct. There is no hemorrhage or mass lesion. The ventricles are of normal size. No significant extraaxial fluid collection is present. The internal auditory canals are within normal limits bilaterally. The brainstem and cerebellum are within normal limits. Note is made of a relatively empty  sella. Flow is present in the major intracranial arteries. The globes and orbits are intact. The paranasal sinuses and right mastoid  air cells are clear. There is some fluid posteriorly in the left mastoid air cells. No obstructing nasopharyngeal lesion is evident. IMPRESSION: 1. Normal MRI appearance of the brain. No acute or focal abnormality to explain the patient's a phasic episode. 2. Relatively empty sella there this is unlikely be of consequence to the patient. 3. Minimal left mastoid effusion. No obstructing nasopharyngeal lesion is present. Electronically Signed   By: San Morelle M.D.   On: 12/31/2015 10:38    Assessment and plan:   Alison Richards is an 65 y.o. female patient  Admitted for TIA workup, negative brain MRI an MRA studies, awaiting transthoracic echocardiogram.  She reported having some numbness and paresthesias in the left hand predominantly in the first 3 digits,   Suggestive of left hand carpal tunnel syndrome. She had bilateral carpal tunnel surgeries done in the past including cervical spinal fusion surgery. I discussed this with the patient and advised her to use a wrist brace which could further help with her symptoms. She was prescribed gabapentin by her PCP 3 days ago. Recommend taking 100 mg of gabapentin in the morning after breakfast, 100 mg after lunch and higher dose of 600 mg after dinner to help with her painful paresthesias symptoms.  She'll follow up with her PCP as well as outpatient neurology after discharge.   No other new neurological issues to address at this time.  We'll sign off.

## 2016-01-03 ENCOUNTER — Ambulatory Visit (INDEPENDENT_AMBULATORY_CARE_PROVIDER_SITE_OTHER): Payer: 59

## 2016-01-03 DIAGNOSIS — M5137 Other intervertebral disc degeneration, lumbosacral region: Secondary | ICD-10-CM | POA: Diagnosis not present

## 2016-01-03 DIAGNOSIS — M5416 Radiculopathy, lumbar region: Secondary | ICD-10-CM

## 2016-01-03 DIAGNOSIS — M5127 Other intervertebral disc displacement, lumbosacral region: Secondary | ICD-10-CM

## 2016-01-03 LAB — HEMOGLOBIN A1C
HEMOGLOBIN A1C: 5.8 % — AB (ref 4.8–5.6)
Hgb A1c MFr Bld: 5.6 % (ref 4.8–5.6)
Mean Plasma Glucose: 114 mg/dL
Mean Plasma Glucose: 120 mg/dL

## 2016-01-03 NOTE — Progress Notes (Signed)
Late entry for missed G-code.    2016/01/22 1341  PT G-Codes **NOT FOR INPATIENT CLASS**  Functional Assessment Tool Used Clinical judgement  Functional Limitation Mobility: Walking and moving around  Mobility: Walking and Moving Around Current Status [phone]) [facility]  Mobility: Walking and Moving Around Goal Status [phone]) CH  Mobility: Walking and Moving Around Discharge Status [phone]) Richards, Alison  [phone] 01/03/2016

## 2016-01-05 ENCOUNTER — Telehealth: Payer: Self-pay

## 2016-01-05 ENCOUNTER — Encounter: Payer: Self-pay | Admitting: Family Medicine

## 2016-01-05 NOTE — Telephone Encounter (Signed)
Patient would like a note stating it is ok to return to work. She was in the ED on 12/31/2015. Please advise.

## 2016-01-05 NOTE — Telephone Encounter (Signed)
Okay for work note. Please let her know that I hope she's feeling better.

## 2016-01-05 NOTE — Telephone Encounter (Signed)
Letter printed. Patient aware she can pick up.

## 2016-01-06 LAB — VITAMIN D 25 HYDROXY (VIT D DEFICIENCY, FRACTURES): Vit D, 25-Hydroxy: 18.4

## 2016-01-07 LAB — VITAMIN D 25 HYDROXY (VIT D DEFICIENCY, FRACTURES): Vit D, 25-Hydroxy: 18.4

## 2016-01-10 ENCOUNTER — Other Ambulatory Visit: Payer: Self-pay | Admitting: Surgery

## 2016-01-11 ENCOUNTER — Ambulatory Visit (INDEPENDENT_AMBULATORY_CARE_PROVIDER_SITE_OTHER): Payer: 59 | Admitting: Family Medicine

## 2016-01-11 ENCOUNTER — Other Ambulatory Visit: Payer: Self-pay | Admitting: Surgery

## 2016-01-11 ENCOUNTER — Encounter: Payer: Self-pay | Admitting: Family Medicine

## 2016-01-11 VITALS — BP 166/56 | HR 92 | Wt 298.0 lb

## 2016-01-11 DIAGNOSIS — R2232 Localized swelling, mass and lump, left upper limb: Secondary | ICD-10-CM

## 2016-01-11 DIAGNOSIS — R7301 Impaired fasting glucose: Secondary | ICD-10-CM | POA: Diagnosis not present

## 2016-01-11 DIAGNOSIS — G459 Transient cerebral ischemic attack, unspecified: Secondary | ICD-10-CM

## 2016-01-11 DIAGNOSIS — G4733 Obstructive sleep apnea (adult) (pediatric): Secondary | ICD-10-CM

## 2016-01-11 DIAGNOSIS — E559 Vitamin D deficiency, unspecified: Secondary | ICD-10-CM | POA: Diagnosis not present

## 2016-01-11 MED ORDER — AMBULATORY NON FORMULARY MEDICATION: Status: DC

## 2016-01-11 NOTE — Progress Notes (Signed)
Subjective:    Patient ID: Alison Richards, female    DOB: August 29, 1951, 65 y.o.   MRN: [id number]  HPI She comes in today for hospital follow-up. She was admitted to the hospital on March 3 and discharged home on March 5 from [facility].  She presented with strokelike symptoms as she was having difficulty forming words. He did have a negative brain MRI and CT of the head and neck. Were no new neurologic symptoms. She was started on aspirin 325 mg daily. Did recommend that she monitor her blood pressures more aggressively and adjust her blood pressure regimen at follow-up visit.  She also noted they added oxygen to her CPAP while in the hospital.    IFG - A1c was stable at 5.8.  He had some blood work done recently.  Review of Systems  BP 166/56 mmHg  Pulse 92  Wt 298 lb (135.172 kg)  SpO2 98%    Allergies  Allergen Reactions  . Dilaudid [Hydromorphone Hcl] Anaphylaxis and Swelling    Throat closing up  . Hydromorphone Anaphylaxis and Shortness Of Breath    Pt had a tightening in her throat  . Acetaminophen Other (See Comments)    hallucinations, cold sweats, headache, metallic taste, tongue swelling  . Angiotensin Receptor Blockers Other (See Comments)    Joint aches  . Erythromycin Ethylsuccinate Diarrhea    gi symptoms, stomach swelling  . Lisinopril Other (See Comments)    ASTHMA LIKE SYMPTOMS, coughing  . Losartan Potassium-Hctz     REACTION: JOINTS aching, hurting all over  . Morphine Sulfate Nausea And Vomiting    Projectile vomiting  . Valsartan     REACTION: Hurting all over  . Latex Itching and Rash    SENSITIVE TO SOME ADHESIVES.  . Sulfa Antibiotics Swelling and Rash    Eyes swelled shut  . Sulfonamide Derivatives Swelling and Rash  . Tape Rash    Red and bruising, Please use "paper" tape only    Past Medical History  Diagnosis Date  . Palpitations   . Orthostatic lightheadedness   . Pain in joint   . Hypertension   . Asthma     PAST HX  OF ASTHMA WHILE TAKING LISINOPRIL--NO PROBLEMS SINCE DISCONTINUING LISINOPRIL  . Shortness of breath     WITH EXERTION-PT RELATES TO HER WEIGHT  . Obstructive sleep apnea     CPAP EVERY NIGHT-SETTING IS 8  . Blood transfusion     AGE 26 -HEMORRHAGED AFTER TONSILLECTOMY  . Protein in urine   . GERD (gastroesophageal reflux disease)     PAST HX  H-PYLORI   . H/O hiatal hernia   . Arthritis     PAIN AND OA RIGHT KNEE  . PONV (postoperative nausea and vomiting)   . Obesity   . H. pylori infection     Hx of   . Hyperlipemia   . Kidney stones   . Wears dentures     full top  . Wears glasses     Past Surgical History  Procedure Laterality Date  . Bladder surgery    . Tubal ligation  1979  . Cesarean section  1979  . Abdominal hysterectomy  1999  . Neck sx for ruptured disc c4-5  2001  . Tonsillectomy  age 31  . Cholecystectomy  3-08    Dr Lennie Hummer  . Left knee replacement  2008  . Total knee arthroplasty  11/14/2011    Procedure: TOTAL KNEE ARTHROPLASTY;  Surgeon:  Mauri Pole, MD;  Location: [facility];  Service: Orthopedics;  Laterality: Right;  combined with general block  . Hand surgery      Bilateral   . Esophageal manometry  09/02/2012    Procedure: ESOPHAGEAL MANOMETRY (EM);  Surgeon: Sable Feil, MD;  Location: [facility];  Service: Endoscopy;  Laterality: N/A;  . Colonoscopy    . Trigger finger release Right 12/25/2013    Procedure: RIGHT LONG TRIGGER RELEASE ;  Surgeon: Tennis Must, MD;  Location: [place];  Service: Orthopedics;  Laterality: Right;    Social History   Social History  . Marital Status: Married    Spouse Name: Herbie Baltimore  . Number of Children: 3  . Years of Education: N/A   Occupational History  . LAB [employer]    Social History Main Topics  . Smoking status: Never Smoker   . Smokeless tobacco: Never Used  . Alcohol Use: No  . Drug Use: No  . Sexual Activity:    Partners: Male   Other Topics Concern  . Not on file    Social History Narrative   Daily caffeine     Family History  Problem Relation Age of Onset  . Alzheimer's disease Mother 75  . Hypertension Mother   . Hyperlipidemia Mother   . Alcohol abuse Father   . Cancer Father   . COPD Father   . Colon cancer Neg Hx   . Lung cancer Father   . Diverticulitis Mother   . Skin cancer Father   . Prostate cancer Father     Outpatient Encounter Prescriptions as of 01/11/2016  Medication Sig  . albuterol (PROVENTIL HFA;VENTOLIN HFA) 108 (90 BASE) MCG/ACT inhaler Inhale 2 puffs into the lungs every 6 (six) hours as needed for wheezing.  . Aliskiren-Hydrochlorothiazide (TEKTURNA HCT) 300-12.5 MG TABS Take 1 tablet by mouth daily before breakfast.   . AMBULATORY NON FORMULARY MEDICATION CPAP Machine Uses at bedtime as directed  . AMBULATORY NON FORMULARY MEDICATION Medication Name: CPAP hose replacement Dx: OSA  Dx code: G47.33  . aspirin 325 MG tablet Take 1 tablet (325 mg total) by mouth daily.  . fluticasone furoate-vilanterol (BREO ELLIPTA) 100-25 MCG/INH AEPB Inhale 1 puff into the lungs daily.  Marland Kitchen gabapentin (NEURONTIN) 300 MG capsule Take 300 mg by mouth See admin instructions. 300mg  at bedtime for one week, then 300mg  twice daily for one week, then 300mg  three times daily starting 3/2  . hydrochlorothiazide (HYDRODIURIL) 25 MG tablet Take 12.5 mg by mouth daily.  Marland Kitchen ipratropium (ATROVENT) 0.06 % nasal spray Place 2 sprays into both nostrils 4 (four) times daily.  Marland Kitchen loratadine (CLARITIN) 10 MG tablet Take 10 mg by mouth daily as needed for allergies.  . Menthol, Topical Analgesic, (BIOFREEZE EX) Apply 1 application topically daily as needed (for pain).  . montelukast (SINGULAIR) 10 MG tablet Take 1 tablet (10 mg total) by mouth at bedtime.  . naproxen sodium (ANAPROX) 220 MG tablet Take 220-440 mg by mouth 2 (two) times daily as needed (for pain).  . polyethylene glycol powder (GLYCOLAX/MIRALAX) powder Take 17 g by mouth daily as needed for  mild constipation.   . [DISCONTINUED] diazepam (VALIUM) 5 MG tablet Take 1 tab PO 1 hour before procedure or imaging. (Patient taking differently: Take 5 mg by mouth See admin instructions. Take 1 tablet by mouth 1 hour before procedure or imaging.)  . [DISCONTINUED] fluconazole (DIFLUCAN) 150 MG tablet Take 1 tablet (150 mg total) by mouth once.  No facility-administered encounter medications on file as of 01/11/2016.          Objective:   Physical Exam  Constitutional: She is oriented to person, place, and time. She appears well-developed and well-nourished.  HENT:  Head: Normocephalic and atraumatic.  Right Ear: External ear normal.  Left Ear: External ear normal.  Nose: Nose normal.  Mouth/Throat: Oropharynx is clear and moist.  TMs and canals are clear.   Eyes: Conjunctivae and EOM are normal. Pupils are equal, round, and reactive to light.  Neck: Neck supple. No thyromegaly present.  Cardiovascular: Normal rate, regular rhythm and normal heart sounds.   Pulmonary/Chest: Effort normal and breath sounds normal. She has no wheezes.  Lymphadenopathy:    She has no cervical adenopathy.  Neurological: She is alert and oriented to person, place, and time. She has normal reflexes. She displays normal reflexes. No cranial nerve deficit. She exhibits normal muscle tone. Coordination normal.  Skin: Skin is warm and dry.  Psychiatric: She has a normal mood and affect. Her behavior is normal.          Assessment & Plan:  TIA - she is 100% better. She is taking a baby ASA and tolreating well. She is back at work and doing well.  IFG - stable. Recheck in 6 months.    Vit D def - last level was low at 18.4. Recommend start 1000 IU daily.    [facility]A - [facility] for download. She uses it consistantly.  At hospital that had to add oxygen to her CPAP.

## 2016-01-11 NOTE — Patient Instructions (Signed)
Vitamin D3 1000 IU  - One a day.

## 2016-01-14 ENCOUNTER — Encounter: Payer: Self-pay | Admitting: Family Medicine

## 2016-01-14 ENCOUNTER — Telehealth: Payer: Self-pay

## 2016-01-14 NOTE — Telephone Encounter (Signed)
Dennis from [facility] called and said that patient did not receive CPAP and supplies from them.  I called patient to find out who supplies this.  LM for her to call back.

## 2016-01-21 ENCOUNTER — Encounter: Payer: Self-pay | Admitting: Sports Medicine

## 2016-01-21 ENCOUNTER — Ambulatory Visit (INDEPENDENT_AMBULATORY_CARE_PROVIDER_SITE_OTHER): Payer: 59 | Admitting: Sports Medicine

## 2016-01-21 ENCOUNTER — Encounter: Payer: Self-pay | Admitting: Family Medicine

## 2016-01-21 ENCOUNTER — Ambulatory Visit
Admission: RE | Admit: 2016-01-21 | Discharge: 2016-01-21 | Disposition: A | Discharge status: Home / Self Care | Payer: 59 | Source: Ambulatory Visit | Attending: Surgery | Admitting: Surgery

## 2016-01-21 DIAGNOSIS — R2232 Localized swelling, mass and lump, left upper limb: Secondary | ICD-10-CM

## 2016-01-21 DIAGNOSIS — M5416 Radiculopathy, lumbar region: Secondary | ICD-10-CM

## 2016-01-21 MED ORDER — CYCLOBENZAPRINE HCL 10 MG PO TABS: ORAL_TABLET | ORAL | Status: DC

## 2016-01-21 NOTE — Progress Notes (Signed)
  Subjective:    CC: Follow-up MRI  HPI: This is a pleasant 65 year old female with left-sided lumbar radiculopathy in an L5 distribution, going to the top and bottom of the foot, she had an old MRI that showed disc protrusions, we recently obtained a new one for interventional planning. Symptoms are moderate, persistent, no constitutional symptoms, no bowel or bladder dysfunction and much better since doing some physical therapy, oral steroids, and gabapentin.  Past medical history, Surgical history, Family history not pertinant except as noted below, Social history, Allergies, and medications have been entered into the medical record, reviewed, and no changes needed.   Review of Systems: No fevers, chills, night sweats, weight loss, chest pain, or shortness of breath.   Objective:    General: Well Developed, well nourished, and in no acute distress.  Neuro: Alert and oriented x3, extra-ocular muscles intact, sensation grossly intact.  HEENT: Normocephalic, atraumatic, pupils equal round reactive to light, neck supple, no masses, no lymphadenopathy, thyroid nonpalpable.  Skin: Warm and dry, no rashes. Cardiac: Regular rate and rhythm, no murmurs rubs or gallops, no lower extremity edema.  Respiratory: Clear to auscultation bilaterally. Not using accessory muscles, speaking in full sentences.  MRI reviewed and shows multilevel disc protrusions, it appears as though the L5-S1 disc comes into contact with the extraforaminal L5 nerve root on the left as well as the intraspinal descending S1 nerve root.  Impression and Recommendations:

## 2016-01-21 NOTE — Assessment & Plan Note (Signed)
Repeat MRI reviewed, there is multilevel degenerative disc disease, worse at the L5-S1 level with clear left-sided extraforaminal L5 nerve root contact. We are going to proceed with a left L5-S1 transforaminal epidural. Pain symptoms are predominantly L5 distribution rather than S1.  Continue gabapentin and adding a bit of Flexeril for muscle spasm.

## 2016-01-28 ENCOUNTER — Ambulatory Visit: Payer: 59 | Admitting: Sports Medicine

## 2016-02-01 ENCOUNTER — Ambulatory Visit (INDEPENDENT_AMBULATORY_CARE_PROVIDER_SITE_OTHER): Payer: 59 | Admitting: Family Medicine

## 2016-02-01 ENCOUNTER — Encounter: Payer: Self-pay | Admitting: Family Medicine

## 2016-02-01 ENCOUNTER — Telehealth: Payer: Self-pay | Admitting: Family Medicine

## 2016-02-01 VITALS — BP 126/66 | HR 105 | Temp 99.9°F | Wt 292.0 lb

## 2016-02-01 DIAGNOSIS — R509 Fever, unspecified: Secondary | ICD-10-CM | POA: Diagnosis not present

## 2016-02-01 DIAGNOSIS — J069 Acute upper respiratory infection, unspecified: Secondary | ICD-10-CM

## 2016-02-01 NOTE — Progress Notes (Signed)
   Subjective:    Patient ID: Alison Richards, female    DOB: Aug 11, 1951, 65 y.o.   MRN: [id number]  HPI Sinusitis sxs x 3 days fever chills facial pressure, bones behind ears hurt and into base of her skull. taking coricidin hbp, mucinex, tussinex.   + cough in upper chest.  + bodyaches.  + exp to flu last week.    Review of Systems     Objective:   Physical Exam  Constitutional: She is oriented to person, place, and time. She appears well-developed and well-nourished.  HENT:  Head: Normocephalic and atraumatic.  Right Ear: External ear normal.  Left Ear: External ear normal.  Nose: Nose normal.  Mouth/Throat: Oropharynx is clear and moist.  TMs and canals are clear.   Eyes: Conjunctivae and EOM are normal. Pupils are equal, round, and reactive to light.  Neck: Neck supple. No thyromegaly present.  Cardiovascular: Normal rate, regular rhythm and normal heart sounds.   Pulmonary/Chest: Effort normal and breath sounds normal. She has no wheezes.  Lymphadenopathy:    She has no cervical adenopathy.  Neurological: She is alert and oriented to person, place, and time.  Skin: Skin is warm and dry.  Psychiatric: She has a normal mood and affect.          Assessment & Plan:  URI - Likely viral-okay to use ibuprofen as needed for upper neck and ear pain. Continue symptomatic care. Exam is otherwise very reassuring today. She feels like she is getting worse or not improving encouraged her to call the office back. She does need to be fever free for at least 24 hours before returning to work. We did go ahead and flu test her today says she was within the window for treatment but she was negative.

## 2016-02-01 NOTE — Telephone Encounter (Signed)
Call pt: overnight pulse showed that she does need oxygen. We completed form for [facility] so they should be contacting her soon.

## 2016-02-02 NOTE — Telephone Encounter (Signed)
lvm w/results and recommendations.Alison Richards Lynetta  

## 2016-03-07 ENCOUNTER — Encounter (HOSPITAL_BASED_OUTPATIENT_CLINIC_OR_DEPARTMENT_OTHER): Payer: Self-pay | Admitting: Emergency Medicine

## 2016-03-07 ENCOUNTER — Emergency Department (HOSPITAL_BASED_OUTPATIENT_CLINIC_OR_DEPARTMENT_OTHER): Payer: 59

## 2016-03-07 ENCOUNTER — Emergency Department (HOSPITAL_BASED_OUTPATIENT_CLINIC_OR_DEPARTMENT_OTHER)
Admission: EM | Admit: 2016-03-07 | Discharge: 2016-03-07 | Disposition: A | Discharge status: Home / Self Care | Payer: 59 | Attending: Emergency Medicine | Admitting: Emergency Medicine

## 2016-03-07 DIAGNOSIS — I10 Essential (primary) hypertension: Secondary | ICD-10-CM | POA: Diagnosis not present

## 2016-03-07 DIAGNOSIS — Z79899 Other long term (current) drug therapy: Secondary | ICD-10-CM | POA: Diagnosis not present

## 2016-03-07 DIAGNOSIS — E669 Obesity, unspecified: Secondary | ICD-10-CM | POA: Diagnosis not present

## 2016-03-07 DIAGNOSIS — J45909 Unspecified asthma, uncomplicated: Secondary | ICD-10-CM | POA: Diagnosis not present

## 2016-03-07 DIAGNOSIS — E785 Hyperlipidemia, unspecified: Secondary | ICD-10-CM | POA: Insufficient documentation

## 2016-03-07 DIAGNOSIS — M1990 Unspecified osteoarthritis, unspecified site: Secondary | ICD-10-CM | POA: Insufficient documentation

## 2016-03-07 DIAGNOSIS — N23 Unspecified renal colic: Secondary | ICD-10-CM | POA: Insufficient documentation

## 2016-03-07 DIAGNOSIS — R109 Unspecified abdominal pain: Secondary | ICD-10-CM | POA: Diagnosis present

## 2016-03-07 LAB — URINALYSIS, ROUTINE W REFLEX MICROSCOPIC
Glucose, UA: NEGATIVE mg/dL
Ketones, ur: NEGATIVE mg/dL
NITRITE: NEGATIVE
Protein, ur: 100 mg/dL — AB
SPECIFIC GRAVITY, URINE: 1.025 (ref 1.005–1.030)
pH: 5.5 (ref 5.0–8.0)

## 2016-03-07 LAB — URINE MICROSCOPIC-ADD ON

## 2016-03-07 MED ORDER — ONDANSETRON 8 MG PO TBDP: 8.0000 mg | ORAL_TABLET | Freq: Three times a day (TID) | ORAL | Status: DC | PRN

## 2016-03-07 MED ORDER — HYDROCODONE-IBUPROFEN 7.5-200 MG PO TABS: 1.0000 | ORAL_TABLET | Freq: Four times a day (QID) | ORAL | Status: DC | PRN

## 2016-03-07 NOTE — ED Provider Notes (Signed)
CSN: [id number]     Arrival date & time 03/07/16  0027 History   First MD Initiated Contact with Patient 03/07/16 [phone]     Chief Complaint  Patient presents with  . Flank Pain     (Consider location/radiation/quality/duration/timing/severity/associated sxs/prior Treatment) HPI  This is a 65 year old female with a history of kidney stones. She is here with left flank pain that began yesterday evening. Her pain was severe at its worst and radiated to her left groin. There was associated nausea but no vomiting. She took 440 milligrams of Aleve prior to arrival and her pain is now mild. She is unable to void at this time. She describes the pain in her flank as feeling like she is lying on a brick.  Past Medical History  Diagnosis Date  . Palpitations   . Orthostatic lightheadedness   . Pain in joint   . Hypertension   . Asthma     PAST HX OF ASTHMA WHILE TAKING LISINOPRIL--NO PROBLEMS SINCE DISCONTINUING LISINOPRIL  . Shortness of breath     WITH EXERTION-PT RELATES TO HER WEIGHT  . Obstructive sleep apnea     CPAP EVERY NIGHT-SETTING IS 8  . Blood transfusion     AGE 21 -HEMORRHAGED AFTER TONSILLECTOMY  . Protein in urine   . GERD (gastroesophageal reflux disease)     PAST HX  H-PYLORI   . H/O hiatal hernia   . Arthritis     PAIN AND OA RIGHT KNEE  . PONV (postoperative nausea and vomiting)   . Obesity   . H. pylori infection     Hx of   . Hyperlipemia   . Kidney stones   . Wears dentures     full top  . Wears glasses    Past Surgical History  Procedure Laterality Date  . Bladder surgery    . Tubal ligation  1979  . Cesarean section  1979  . Abdominal hysterectomy  1999  . Neck sx for ruptured disc c4-5  2001  . Tonsillectomy  age 89  . Cholecystectomy  3-08    Dr Lennie Hummer  . Left knee replacement  2008  . Total knee arthroplasty  11/14/2011    Procedure: TOTAL KNEE ARTHROPLASTY;  Surgeon: Mauri Pole, MD;  Location: [facility];  Service: Orthopedics;  Laterality:  Right;  combined with general block  . Hand surgery      Bilateral   . Esophageal manometry  09/02/2012    Procedure: ESOPHAGEAL MANOMETRY (EM);  Surgeon: Sable Feil, MD;  Location: [facility];  Service: Endoscopy;  Laterality: N/A;  . Colonoscopy    . Trigger finger release Right 12/25/2013    Procedure: RIGHT LONG TRIGGER RELEASE ;  Surgeon: Tennis Must, MD;  Location: [place];  Service: Orthopedics;  Laterality: Right;   Family History  Problem Relation Age of Onset  . Alzheimer's disease Mother 51  . Hypertension Mother   . Hyperlipidemia Mother   . Alcohol abuse Father   . Cancer Father   . COPD Father   . Colon cancer Neg Hx   . Lung cancer Father   . Diverticulitis Mother   . Skin cancer Father   . Prostate cancer Father    Social History  Substance Use Topics  . Smoking status: Never Smoker   . Smokeless tobacco: Never Used  . Alcohol Use: No   OB History    No data available     Review of Systems  All other systems reviewed and are negative.   Allergies  Dilaudid; Hydromorphone; Acetaminophen; Angiotensin receptor blockers; Erythromycin ethylsuccinate; Lisinopril; Losartan potassium-hctz; Morphine sulfate; Valsartan; Latex; Sulfa antibiotics; Sulfonamide derivatives; and Tape  Home Medications   Prior to Admission medications   Medication Sig Start Date End Date Taking? Authorizing Provider  albuterol (PROVENTIL HFA;VENTOLIN HFA) 108 (90 BASE) MCG/ACT inhaler Inhale 2 puffs into the lungs every 6 (six) hours as needed for wheezing. 09/30/15 09/29/16  Gregor Hams, MD  Aliskiren-Hydrochlorothiazide (TEKTURNA HCT) 300-12.5 MG TABS Take 1 tablet by mouth daily before breakfast.     Historical Provider, MD  AMBULATORY NON FORMULARY MEDICATION CPAP Machine Uses at bedtime as directed    Historical Provider, MD  AMBULATORY NON FORMULARY MEDICATION Medication Name: [facility]. Needs download from her CPAP for 30 days. Also need  overnight pulse ox.  Dx: OSA  Dx code: G47.33 01/11/16   Hali Marry, MD  aspirin 325 MG tablet Take 1 tablet (325 mg total) by mouth daily. 01/02/16   Velvet Bathe, MD  cyclobenzaprine (FLEXERIL) 10 MG tablet One half tab PO qHS, then increase gradually to one tab TID. 01/21/16   Silverio Decamp, MD  fluticasone furoate-vilanterol (BREO ELLIPTA) 100-25 MCG/INH AEPB Inhale 1 puff into the lungs daily. 12/30/15   Hali Marry, MD  gabapentin (NEURONTIN) 300 MG capsule Take 300 mg by mouth See admin instructions. 300mg  at bedtime for one week, then 300mg  twice daily for one week, then 300mg  three times daily starting 3/2    Historical Provider, MD  hydrochlorothiazide (HYDRODIURIL) 25 MG tablet Take 12.5 mg by mouth daily.    Historical Provider, MD  ipratropium (ATROVENT) 0.06 % nasal spray Place 2 sprays into both nostrils 4 (four) times daily. 09/30/15   Gregor Hams, MD  loratadine (CLARITIN) 10 MG tablet Take 10 mg by mouth daily as needed for allergies.    Historical Provider, MD  Menthol, Topical Analgesic, (BIOFREEZE EX) Apply 1 application topically daily as needed (for pain).    Historical Provider, MD  montelukast (SINGULAIR) 10 MG tablet Take 1 tablet (10 mg total) by mouth at bedtime. 06/28/15   Emeterio Reeve, DO  naproxen sodium (ANAPROX) 220 MG tablet Take 220-440 mg by mouth 2 (two) times daily as needed (for pain).    Historical Provider, MD  polyethylene glycol powder (GLYCOLAX/MIRALAX) powder Take 17 g by mouth daily as needed for mild constipation.  11/04/11   Historical Provider, MD   BP 124/71 mmHg  Pulse 83  Temp(Src) 98.1 F (36.7 C) (Oral)  Resp 18  Ht 5\' 4"  (1.626 m)  Wt 292 lb (132.45 kg)  BMI 50.10 kg/m2  SpO2 98%  LMP    Physical Exam  General: Well-developed, well-nourished female in no acute distress; appearance consistent with age of record HENT: normocephalic; atraumatic Eyes: pupils equal, round and reactive to light; extraocular muscles  intact Neck: supple Heart: regular rate and rhythm Lungs: clear to auscultation bilaterally Abdomen: soft; nondistended; nontender; no masses or hepatosplenomegaly; bowel sounds present GU: Mild left CVA tenderness Extremities: No deformity; full range of motion; pulses normal Neurologic: Awake, alert and oriented; motor function intact in all extremities and symmetric; no facial droop Skin: Warm and dry Psychiatric: Normal mood and affect    ED Course  Procedures (including critical care time)   MDM  Nursing notes and vitals signs, including pulse oximetry, reviewed.  Summary of this visit's results, reviewed by myself:  Labs:  Results for orders  placed or performed during the hospital encounter of 03/07/16 (from the past 24 hour(s))  Urinalysis, Routine w reflex microscopic (not at [facility])     Status: Abnormal   Collection Time: 03/07/16  2:05 AM  Result Value Ref Range   Color, Urine AMBER (A) YELLOW   APPearance CLOUDY (A) CLEAR   Specific Gravity, Urine 1.025 1.005 - 1.030   pH 5.5 5.0 - 8.0   Glucose, UA NEGATIVE NEGATIVE mg/dL   Hgb urine dipstick LARGE (A) NEGATIVE   Bilirubin Urine SMALL (A) NEGATIVE   Ketones, ur NEGATIVE NEGATIVE mg/dL   Protein, ur 100 (A) NEGATIVE mg/dL   Nitrite NEGATIVE NEGATIVE   Leukocytes, UA TRACE (A) NEGATIVE  Urine microscopic-add on     Status: Abnormal   Collection Time: 03/07/16  2:05 AM  Result Value Ref Range   Squamous Epithelial / LPF 0-5 (A) NONE SEEN   WBC, UA 0-5 0 - 5 WBC/hpf   RBC / HPF TOO NUMEROUS TO COUNT 0 - 5 RBC/hpf   Bacteria, UA FEW (A) NONE SEEN    Imaging Studies: Ct Renal Stone Study  03/07/2016  CLINICAL DATA:  65 year old female with left flank pain EXAM: CT ABDOMEN AND PELVIS WITHOUT CONTRAST TECHNIQUE: Multidetector CT imaging of the abdomen and pelvis was performed following the standard protocol without IV contrast. COMPARISON:  CT dated 10/05/2012 FINDINGS: Evaluation of this exam is limited in the  absence of intravenous contrast. The visualized lung bases are clear. No intra-abdominal free air or free fluid. Diffuse hepatic steatosis. Cholecystectomy. The pancreas, spleen, adrenal glands appear unremarkable. There is a 7 mm nonobstructing left renal upper pole calculus. Two small adjacent nonobstructing calculi noted in the inferior pole of the left kidney. There is no hydronephrosis. A 2 mm calculus noted along the posterior wall of the urinary bladder adjacent to the left UVJ which may represent a recently passed left renal calculus versus a left UVJ stone. There is a punctate nonobstructing right renal interpolar calculus. There is no hydronephrosis. The right ureter appears unremarkable. Hysterectomy. Constipation. Multiple normal caliber fecalized loops of small bowel noted suggestive of chronic stasis. There is no evidence of bowel obstruction or active inflammation. Small scattered colonic diverticula without active inflammation. Normal appendix. The abdominal aorta and IVC appear grossly unremarkable on this noncontrast study. No portal venous gas identified. There is no adenopathy. The abdominal wall soft tissues appear unremarkable. There is degenerative changes of the spine. No acute fracture. IMPRESSION: A 2 mm recently passed left renal calculus versus a left UVJ stone. Bilateral nonobstructing intrarenal calculi as described. There is no hydronephrosis on either side. Fatty liver. Constipation.  No bowel obstruction.  Normal appendix. Electronically Signed   By: Anner Crete M.D.   On: 03/07/2016 02:45   2:51 AM Patient continues to have minimal pain. She was advised of CT and urinalysis findings. As it is not clear that the stone has passed we will treat her with Vicoprofen for pain which she is known to tolerate.    Shanon Rosser, MD 03/07/16 [phone]

## 2016-03-07 NOTE — ED Notes (Signed)
Pt still unable to go to restroom, given water per Dr. Florina Ou.  She states that all evening she's felt urinary frequency without being able to go a lot.  Tonight she felt left flank pain and nausea and took two aleve prior to arrival.  She denies flank pain or nausea currently, still c/o urinary frequency.

## 2016-03-07 NOTE — ED Notes (Signed)
Pt states she is having pain in left side since this evening got worse after going to bed.  Having nausea denies vomiting.

## 2016-03-07 NOTE — ED Notes (Signed)
Pt drinking water 

## 2016-03-07 NOTE — ED Notes (Signed)
Pt verbalizes understanding of d/c instructions and denies any further needs at this time. 

## 2016-03-07 NOTE — Discharge Instructions (Signed)
Kidney Stones °Kidney stones (urolithiasis) are deposits that form inside your kidneys. The intense pain is caused by the stone moving through the urinary tract. When the stone moves, the ureter goes into spasm around the stone. The stone is usually passed in the urine.  °CAUSES  °· A disorder that makes certain neck glands produce too much parathyroid hormone (primary hyperparathyroidism). °· A buildup of uric acid crystals, similar to gout in your joints. °· Narrowing (stricture) of the ureter. °· A kidney obstruction present at birth (congenital obstruction). °· Previous surgery on the kidney or ureters. °· Numerous kidney infections. °SYMPTOMS  °· Feeling sick to your stomach (nauseous). °· Throwing up (vomiting). °· Blood in the urine (hematuria). °· Pain that usually spreads (radiates) to the groin. °· Frequency or urgency of urination. °DIAGNOSIS  °· Taking a history and physical exam. °· Blood or urine tests. °· CT scan. °· Occasionally, an examination of the inside of the urinary bladder (cystoscopy) is performed. °TREATMENT  °· Observation. °· Increasing your fluid intake. °· Extracorporeal shock wave lithotripsy--This is a noninvasive procedure that uses shock waves to break up kidney stones. °· Surgery may be needed if you have severe pain or persistent obstruction. There are various surgical procedures. Most of the procedures are performed with the use of small instruments. Only small incisions are needed to accommodate these instruments, so recovery time is minimized. °The size, location, and chemical composition are all important variables that will determine the proper choice of action for you. Talk to your health care provider to better understand your situation so that you will minimize the risk of injury to yourself and your kidney.  °HOME CARE INSTRUCTIONS  °· Drink enough water and fluids to keep your urine clear or pale yellow. This will help you to pass the stone or stone fragments. °· Strain  all urine through the provided strainer. Keep all particulate matter and stones for your health care provider to see. The stone causing the pain may be as small as a grain of salt. It is very important to use the strainer each and every time you pass your urine. The collection of your stone will allow your health care provider to analyze it and verify that a stone has actually passed. The stone analysis will often identify what you can do to reduce the incidence of recurrences. °· Only take over-the-counter or prescription medicines for pain, discomfort, or fever as directed by your health care provider. °· Keep all follow-up visits as told by your health care provider. This is important. °· Get follow-up X-rays if required. The absence of pain does not always mean that the stone has passed. It may have only stopped moving. If the urine remains completely obstructed, it can cause loss of kidney function or even complete destruction of the kidney. It is your responsibility to make sure X-rays and follow-ups are completed. Ultrasounds of the kidney can show blockages and the status of the kidney. Ultrasounds are not associated with any radiation and can be performed easily in a matter of minutes. °· Make changes to your daily diet as told by your health care provider. You may be told to: °¨ Limit the amount of salt that you eat. °¨ Eat 5 or more servings of fruits and vegetables each day. °¨ Limit the amount of meat, poultry, fish, and eggs that you eat. °· Collect a 24-hour urine sample as told by your health care provider. You may need to collect another urine sample every 6-12   months. °SEEK MEDICAL CARE IF: °· You experience pain that is progressive and unresponsive to any pain medicine you have been prescribed. °SEEK IMMEDIATE MEDICAL CARE IF:  °· Pain cannot be controlled with the prescribed medicine. °· You have a fever or shaking chills. °· The severity or intensity of pain increases over 18 hours and is not  relieved by pain medicine. °· You develop a new onset of abdominal pain. °· You feel faint or pass out. °· You are unable to urinate. °  °This information is not intended to replace advice given to you by your health care provider. Make sure you discuss any questions you have with your health care provider. °  °Document Released: 10/16/2005 Document Revised: 07/07/2015 Document Reviewed: 03/19/2013 °Elsevier Interactive Patient Education ©2016 [employer] ° °

## 2016-04-12 ENCOUNTER — Ambulatory Visit: Payer: 59 | Admitting: Family Medicine

## 2016-04-25 ENCOUNTER — Ambulatory Visit
Admission: RE | Admit: 2016-04-25 | Discharge: 2016-04-25 | Disposition: A | Discharge status: Home / Self Care | Payer: 59 | Source: Ambulatory Visit | Attending: Sports Medicine | Admitting: Sports Medicine

## 2016-04-25 VITALS — BP 163/85 | HR 75

## 2016-04-25 DIAGNOSIS — M5416 Radiculopathy, lumbar region: Secondary | ICD-10-CM

## 2016-04-25 MED ORDER — IOPAMIDOL (ISOVUE-M 200) INJECTION 41%
1.0000 mL | Freq: Once | INTRAMUSCULAR | Status: AC
  Administered 2016-04-25: 1 mL via EPIDURAL

## 2016-04-25 MED ORDER — METHYLPREDNISOLONE ACETATE 40 MG/ML INJ SUSP (RADIOLOG
120.0000 mg | Freq: Once | INTRAMUSCULAR | Status: AC
  Administered 2016-04-25: 120 mg via EPIDURAL

## 2016-04-25 NOTE — Discharge Instructions (Signed)

## 2016-05-18 ENCOUNTER — Encounter: Payer: Self-pay | Admitting: Family Medicine

## 2016-05-18 ENCOUNTER — Ambulatory Visit (INDEPENDENT_AMBULATORY_CARE_PROVIDER_SITE_OTHER): Payer: 59 | Admitting: Family Medicine

## 2016-05-18 ENCOUNTER — Other Ambulatory Visit: Payer: Self-pay | Admitting: *Deleted

## 2016-05-18 ENCOUNTER — Ambulatory Visit (INDEPENDENT_AMBULATORY_CARE_PROVIDER_SITE_OTHER): Payer: 59

## 2016-05-18 VITALS — BP 112/54 | HR 79

## 2016-05-18 DIAGNOSIS — R319 Hematuria, unspecified: Secondary | ICD-10-CM | POA: Diagnosis not present

## 2016-05-18 DIAGNOSIS — R05 Cough: Secondary | ICD-10-CM

## 2016-05-18 DIAGNOSIS — R7301 Impaired fasting glucose: Secondary | ICD-10-CM | POA: Diagnosis not present

## 2016-05-18 DIAGNOSIS — R059 Cough, unspecified: Secondary | ICD-10-CM

## 2016-05-18 DIAGNOSIS — J45909 Unspecified asthma, uncomplicated: Secondary | ICD-10-CM

## 2016-05-18 DIAGNOSIS — R0789 Other chest pain: Secondary | ICD-10-CM

## 2016-05-18 LAB — CBC WITH DIFFERENTIAL/PLATELET
BASO(ABSOLUTE): 0.1
Bands: 2
Basophils Absolute: 1 /uL
EOS (ABSOLUTE): 0.4
EOS: 3 %
HCT: 40 %
Hemoglobin: 14
LYMPH#: 30
Lymphs(Absolute): 4
MCH: 30.4
MCHC: 34.7
MCV: 88
Monocytes(Absolute): 0.9
Monocytes: 7
Neutrophils Absolute: 8 /uL
Neutrophils: 57
Platelets: 305
RBC: 4.6
RDW: 13.7
WBC: 13.4

## 2016-05-18 LAB — COMPLETE METABOLIC PANEL WITH GFR
A/G RATIO: 1.4
ALBUMIN SERUM: 4.3
ALK PHOS: 88
ALT(SGPT): 25
AST: 28 U/L
BILIRUBIN TOTAL: 0.4 mg/dL
BUN / CREAT RATIO: 22
BUN: 20
CHLORIDE, SERUM: 99
CREATININE: 0.91
Calcium, Ser: 9.3
Carbon Dioxide, Total: 31
EGFR (African American): 77
EGFR (Non-African Amer.): 67
GLOBULIN, TOTAL: 3.1
Glucose: 94
Potassium, serum: 4.1
SODIUM, SERUM: 135
Total Protein: 7.4 g/dL

## 2016-05-18 LAB — TSH: TSH: 3.02 m[IU]/L

## 2016-05-18 LAB — CK: Total CK: 196 U/L — ABNORMAL HIGH (ref 7–177)

## 2016-05-18 MED ORDER — ALBUTEROL SULFATE HFA 108 (90 BASE) MCG/ACT IN AERS: 2.0000 | INHALATION_SPRAY | Freq: Four times a day (QID) | RESPIRATORY_TRACT | Status: DC | PRN

## 2016-05-18 NOTE — Progress Notes (Signed)
Subjective:    CC: CP  HPI:  She comes in today complaining of chest pain that occurs for a few minutes at a time with rest or activity that been going on for about a week. She says today she feels extremely bloated. She has not eaten anything today. She is actually retiring in about a week to be able to spend more time with her husband he has pulmonary fibrosis. She initially plan on working for about 2 more years that she has been under a lot of stress. The pain is midsternal chest. It does not radiate. No diaphoresis or shortness of breath with it. She has also noticed a cough and facial flushing. No SOB or wheezing.    She also comes in today complaining of blood in her urine. 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 She came in today and provided reports from 4 different days with 4 different samples in each sample showed a positive microscopic review of significant levels of red blood cells. The initial one on June 29 was too numerous to count. The second report on July 12 showed 6-12 red blood cells. The report on July 18 showed 20-40 red blood cells and the report on July 19 showed greater than 50 red blood cells. She does have a prior history kidney stones but has not had any pain such as that. She has had incontinence for quite some time and it is not new. She says it seems to leak more with position. Not so much with coughing or sneezing. She typically wears a pad daily for this. She did bring another fresh urine sample this morning.  BP 112/54 mmHg  Pulse 79  SpO2 98%    Allergies  Allergen Reactions  . Dilaudid [Hydromorphone Hcl] Anaphylaxis and Swelling    Throat closing up  . Hydromorphone Anaphylaxis and Shortness Of Breath    Pt had a tightening in her throat  . Acetaminophen Other (See Comments)    hallucinations, cold sweats, headache, metallic taste, tongue swelling  . Angiotensin Receptor Blockers Other (See Comments)    Joint aches  . Erythromycin Ethylsuccinate  Diarrhea    gi symptoms, stomach swelling  . Lisinopril Other (See Comments)    ASTHMA LIKE SYMPTOMS, coughing  . Losartan Potassium-Hctz     REACTION: JOINTS aching, hurting all over  . Morphine Sulfate Nausea And Vomiting    Projectile vomiting  . Valsartan     REACTION: Hurting all over  . Latex Itching and Rash    SENSITIVE TO SOME ADHESIVES.  . Sulfa Antibiotics Swelling and Rash    Eyes swelled shut  . Sulfonamide Derivatives Swelling and Rash  . Tape Rash    Red and bruising, Please use "paper" tape only    Past Medical History  Diagnosis Date  . Palpitations   . Orthostatic lightheadedness   . Pain in joint   . Hypertension   . Asthma     PAST HX OF ASTHMA WHILE TAKING LISINOPRIL--NO PROBLEMS SINCE DISCONTINUING LISINOPRIL  . Shortness of breath     WITH EXERTION-PT RELATES TO HER WEIGHT  . Obstructive sleep apnea     CPAP EVERY NIGHT-SETTING IS 8  . Blood transfusion     AGE 49 -HEMORRHAGED AFTER TONSILLECTOMY  . Protein in urine   . GERD (gastroesophageal reflux disease)     PAST HX  H-PYLORI   . H/O hiatal hernia   . Arthritis     PAIN AND OA RIGHT KNEE  .  PONV (postoperative nausea and vomiting)   . Obesity   . H. pylori infection     Hx of   . Hyperlipemia   . Kidney stones   . Wears dentures     full top  . Wears glasses     Past Surgical History  Procedure Laterality Date  . Bladder surgery    . Tubal ligation  1979  . Cesarean section  1979  . Abdominal hysterectomy  1999  . Neck sx for ruptured disc c4-5  2001  . Tonsillectomy  age 45  . Cholecystectomy  3-08    Dr Lennie Hummer  . Left knee replacement  2008  . Total knee arthroplasty  11/14/2011    Procedure: TOTAL KNEE ARTHROPLASTY;  Surgeon: Mauri Pole, MD;  Location: [facility];  Service: Orthopedics;  Laterality: Right;  combined with general block  . Hand surgery      Bilateral   . Esophageal manometry  09/02/2012    Procedure: ESOPHAGEAL MANOMETRY (EM);  Surgeon: Sable Feil,  MD;  Location: [facility];  Service: Endoscopy;  Laterality: N/A;  . Colonoscopy    . Trigger finger release Right 12/25/2013    Procedure: RIGHT LONG TRIGGER RELEASE ;  Surgeon: Tennis Must, MD;  Location: [place];  Service: Orthopedics;  Laterality: Right;    Social History   Social History  . Marital Status: Married    Spouse Name: Herbie Baltimore  . Number of Children: 3  . Years of Education: N/A   Occupational History  . LAB [employer]    Social History Main Topics  . Smoking status: Never Smoker   . Smokeless tobacco: Never Used  . Alcohol Use: No  . Drug Use: No  . Sexual Activity:    Partners: Male   Other Topics Concern  . Not on file   Social History Narrative   Daily caffeine     Family History  Problem Relation Age of Onset  . Alzheimer's disease Mother 64  . Hypertension Mother   . Hyperlipidemia Mother   . Alcohol abuse Father   . Cancer Father   . COPD Father   . Colon cancer Neg Hx   . Lung cancer Father   . Diverticulitis Mother   . Skin cancer Father   . Prostate cancer Father     Outpatient Encounter Prescriptions as of 05/18/2016  Medication Sig  . albuterol (PROVENTIL HFA;VENTOLIN HFA) 108 (90 BASE) MCG/ACT inhaler Inhale 2 puffs into the lungs every 6 (six) hours as needed for wheezing.  . Aliskiren-Hydrochlorothiazide (TEKTURNA HCT) 300-12.5 MG TABS Take 1 tablet by mouth daily before breakfast.   . AMBULATORY NON FORMULARY MEDICATION CPAP Machine Uses at bedtime as directed  . AMBULATORY NON FORMULARY MEDICATION Medication Name: [facility]. Needs download from her CPAP for 30 days. Also need overnight pulse ox.  Dx: OSA  Dx code: G47.33  . aspirin 325 MG tablet Take 1 tablet (325 mg total) by mouth daily.  . cyclobenzaprine (FLEXERIL) 10 MG tablet One half tab PO qHS, then increase gradually to one tab TID.  . fluticasone furoate-vilanterol (BREO ELLIPTA) 100-25 MCG/INH AEPB Inhale 1 puff into the lungs daily.  Marland Kitchen  gabapentin (NEURONTIN) 300 MG capsule Take 300 mg by mouth See admin instructions. 300mg  at bedtime for one week, then 300mg  twice daily for one week, then 300mg  three times daily starting 3/2  . hydrochlorothiazide (HYDRODIURIL) 25 MG tablet Take 12.5 mg by mouth daily.  Marland Kitchen  HYDROcodone-ibuprofen (VICOPROFEN) 7.5-200 MG tablet Take 1-2 tablets by mouth every 6 (six) hours as needed for severe pain.  Marland Kitchen ipratropium (ATROVENT) 0.06 % nasal spray Place 2 sprays into both nostrils 4 (four) times daily.  Marland Kitchen loratadine (CLARITIN) 10 MG tablet Take 10 mg by mouth daily as needed for allergies.  . Menthol, Topical Analgesic, (BIOFREEZE EX) Apply 1 application topically daily as needed (for pain).  . montelukast (SINGULAIR) 10 MG tablet Take 1 tablet (10 mg total) by mouth at bedtime.  . naproxen sodium (ANAPROX) 220 MG tablet Take 220-440 mg by mouth 2 (two) times daily as needed (for pain).  . ondansetron (ZOFRAN ODT) 8 MG disintegrating tablet Take 1 tablet (8 mg total) by mouth every 8 (eight) hours as needed for nausea or vomiting.  . polyethylene glycol powder (GLYCOLAX/MIRALAX) powder Take 17 g by mouth daily as needed for mild constipation.    No facility-administered encounter medications on file as of 05/18/2016.        Review of Systems: No fevers, chills, night sweats, weight loss, chest pain, or shortness of breath.   Objective:    General: Well Developed, well nourished, and in no acute distress.  Neuro: Alert and oriented x3, extra-ocular muscles intact, sensation grossly intact.  HEENT: Normocephalic, atraumatic  Skin: Warm and dry, no rashes. Cardiac: Regular rate and rhythm, no murmurs rubs or gallops, no lower extremity edema.  Respiratory: Clear to auscultation bilaterally. Not using accessory muscles, speaking in full sentences.   Impression and Recommendations:   Atypical chest pain-we'll do EKG today for further evaluation. EKG shows rate of 77 bpm, normal sinus rhythm,  normal axis, no acute ST-T wave changes. The she does have T-wave inversion in lead V3. And poor R-wave progression the lateral leads. Will get CXR.  We'll go ahead and get additional lab work today though I don't think that this is cardiac. With the cough etc. maximally more worried about a potential pulmonary infection.  Hematuria-she's had multiple positive microscopic reviews on multiple different days. She recently had a creatinine which was normal at 0.9. We'll schedule her also for a renal ultrasound. Or CT urography.   Asthma-has had a cough but no wheezing or shortness of breath. She's currently using her. Daily.  IFG - due for A1C.

## 2016-05-19 LAB — URINALYSIS, MICROSCOPIC ONLY
BACTERIA UA: NONE SEEN [HPF]
Casts: NONE SEEN [LPF]
Crystals: NONE SEEN [HPF]
Yeast: NONE SEEN [HPF]

## 2016-05-19 LAB — HEMOGLOBIN A1C
Hgb A1c MFr Bld: 5.8 % — ABNORMAL HIGH (ref <5.7)
Mean Plasma Glucose: 120 mg/dL

## 2016-05-19 LAB — TROPONIN I

## 2016-05-19 NOTE — Addendum Note (Signed)
Addended by: Teddy Spike on: 05/19/2016 08:46 AM   Modules accepted: Orders

## 2016-05-19 NOTE — Addendum Note (Signed)
Addended by: Beatrice Lecher D on: 05/19/2016 09:04 AM   Modules accepted: Orders

## 2016-05-20 LAB — URINE CULTURE

## 2016-05-23 ENCOUNTER — Ambulatory Visit (INDEPENDENT_AMBULATORY_CARE_PROVIDER_SITE_OTHER): Payer: 59

## 2016-05-23 DIAGNOSIS — R319 Hematuria, unspecified: Secondary | ICD-10-CM

## 2016-05-23 DIAGNOSIS — N200 Calculus of kidney: Secondary | ICD-10-CM | POA: Diagnosis not present

## 2016-05-23 MED ORDER — IOPAMIDOL (ISOVUE-300) INJECTION 61%
100.0000 mL | Freq: Once | INTRAVENOUS | Status: AC | PRN
  Administered 2016-05-23: 125 mL via INTRAVENOUS

## 2016-05-25 MED ORDER — PREDNISONE 20 MG PO TABS: 40.0000 mg | ORAL_TABLET | Freq: Every day | ORAL | 0 refills | Status: DC

## 2016-06-13 DIAGNOSIS — R0789 Other chest pain: Secondary | ICD-10-CM | POA: Diagnosis not present

## 2016-06-14 LAB — CK: Total CK: 154 U/L (ref 7–177)

## 2016-07-24 ENCOUNTER — Encounter: Payer: Self-pay | Admitting: Family Medicine

## 2016-07-24 ENCOUNTER — Ambulatory Visit (INDEPENDENT_AMBULATORY_CARE_PROVIDER_SITE_OTHER): Payer: Medicare Other | Admitting: Family Medicine

## 2016-07-24 VITALS — BP 135/65 | HR 83 | Wt 291.0 lb

## 2016-07-24 DIAGNOSIS — Z23 Encounter for immunization: Secondary | ICD-10-CM | POA: Diagnosis not present

## 2016-07-24 DIAGNOSIS — G4734 Idiopathic sleep related nonobstructive alveolar hypoventilation: Secondary | ICD-10-CM

## 2016-07-24 MED ORDER — AMBULATORY NON FORMULARY MEDICATION: 0 refills | Status: DC

## 2016-07-24 NOTE — Progress Notes (Signed)
   Subjective:    Patient ID: Alison Richards, female    DOB: [birth year], 65 y.o.   MRN: [id number]  HPI 65 year old female comes in today to discuss nocturnal hypoxemia. Patient reports that at night when she tries to sleep she experiences excessive snoring and very poor sleep quality. She tosses and turns. She often wakes up with headaches first thing in the mornings and she seems to drain excessively. Upon recent hospitalization she was noted to have dropped her oxygen level while sleeping. Thus she was encouraged to follow up on this to be further evaluated for nocturnal hypoxemia.  She's also gained a little bit of weight back. She says she is planning on getting back on track and cutting out sugar again which is what helped her lose weight before. She says she notices a big difference in how she feels they today when she weighs less.  Review of Systems     Objective:   Physical Exam  Constitutional: She is oriented to person, place, and time. She appears well-developed and well-nourished.  HENT:  Head: Normocephalic and atraumatic.  Cardiovascular: Normal rate, regular rhythm and normal heart sounds.   Pulmonary/Chest: Effort normal and breath sounds normal.  Neurological: She is alert and oriented to person, place, and time.  Skin: Skin is warm and dry.  Psychiatric: She has a normal mood and affect. Her behavior is normal.       Assessment & Plan:  Nocturnal hypoxemia - Will schedule overnight oxygen test through [facility]. Will fax order over to [facility] at [phone].  Morbid obesity/BMI 49 - encourage her to get back on track with diet and exercise.

## 2016-08-07 DIAGNOSIS — G4734 Idiopathic sleep related nonobstructive alveolar hypoventilation: Secondary | ICD-10-CM | POA: Diagnosis not present

## 2016-08-10 ENCOUNTER — Telehealth: Payer: Self-pay | Admitting: Family Medicine

## 2016-08-10 MED ORDER — AMBULATORY NON FORMULARY MEDICATION: 0 refills | Status: DC

## 2016-08-10 NOTE — Telephone Encounter (Signed)
Pt informed. She would like order sent to [facility] for O2.Audelia Hives [place]

## 2016-08-10 NOTE — Telephone Encounter (Signed)
Call pt: I did receive her overnight pulse ox results. She had 154 desaturation events. She does qualify for nocturnal oxygen per Medicare guidelines. We will place an order to get her started on 2 L of oxygen. Do we now need to order CPAP study or supplies.

## 2016-08-23 ENCOUNTER — Other Ambulatory Visit: Payer: Self-pay | Admitting: Urology

## 2016-08-23 ENCOUNTER — Encounter: Payer: Self-pay | Admitting: Family Medicine

## 2016-08-23 DIAGNOSIS — R32 Unspecified urinary incontinence: Secondary | ICD-10-CM | POA: Diagnosis not present

## 2016-08-23 DIAGNOSIS — R311 Benign essential microscopic hematuria: Secondary | ICD-10-CM | POA: Diagnosis not present

## 2016-08-23 DIAGNOSIS — N200 Calculus of kidney: Secondary | ICD-10-CM | POA: Diagnosis not present

## 2016-08-24 NOTE — Telephone Encounter (Signed)
Kelsi, can you call and find out what is going on. The only notes that should have been faxed are the note from 9/25 to get her oxygen covered.  That note dosn't  Mention CPAP.

## 2016-08-29 ENCOUNTER — Encounter: Payer: Self-pay | Admitting: Family Medicine

## 2016-08-29 ENCOUNTER — Ambulatory Visit (INDEPENDENT_AMBULATORY_CARE_PROVIDER_SITE_OTHER): Payer: Medicare Other

## 2016-08-29 ENCOUNTER — Encounter (HOSPITAL_BASED_OUTPATIENT_CLINIC_OR_DEPARTMENT_OTHER): Payer: Self-pay | Admitting: *Deleted

## 2016-08-29 ENCOUNTER — Ambulatory Visit (INDEPENDENT_AMBULATORY_CARE_PROVIDER_SITE_OTHER): Payer: Medicare Other | Admitting: Family Medicine

## 2016-08-29 VITALS — BP 123/70 | HR 81 | Wt 291.0 lb

## 2016-08-29 DIAGNOSIS — R0602 Shortness of breath: Secondary | ICD-10-CM | POA: Diagnosis not present

## 2016-08-29 LAB — D-DIMER, QUANTITATIVE (NOT AT ARMC): D DIMER QUANT: 0.3 ug{FEU}/mL (ref <0.50)

## 2016-08-29 MED ORDER — PREDNISONE 20 MG PO TABS: 40.0000 mg | ORAL_TABLET | Freq: Every day | ORAL | 0 refills | Status: DC

## 2016-08-29 NOTE — Progress Notes (Signed)
Subjective:    CC: SOB  HPI:  65 year old female with a history of asthma comes in today complaining of shortness of breath.  She said she woke up suddenly in the middle of night with a headache and feeling short of breath. She checked her oxygen level and it was 80%. She was wearing her CPAP and her oxygen. She checked everything to make sure there were no loose cords etc. and everything seemed to be in place. She readjusted her mask. She was eventually able to fall back asleep and after about an hour woke up feeling about the same way. Finally this morning she woke up and just felt very short of breath. She no is noticing a little squeak in the lungs but not a full on wheeze. She does have a history of asthma. She says normally this time a year she actually does well once the weather gets cold. She is not new to use her albuterol in almost a month.  Went to come in today and get it checked out because she section scheduled for a procedure to remove a kidney stone on Friday and will be sedated for most an hour.  Past medical history, Surgical history, Family history not pertinant except as noted below, Social history, Allergies, and medications have been entered into the medical record, reviewed, and corrections made.   Review of Systems: No fevers, chills, night sweats, weight loss, chest pain, or shortness of breath.   Objective:    General: Well Developed, well nourished, and in no acute distress.  Neuro: Alert and oriented x3, extra-ocular muscles intact, sensation grossly intact.  HEENT: Normocephalic, atraumatic, Oropharynx is clear, TMs and canals are clear bilaterally. No significant cervical lymphadenopathy.  Skin: Warm and dry, no rashes. Cardiac: Regular rate and rhythm, no murmurs rubs or gallops, no lower extremity edema.  Respiratory: Slightly coarse breath sounds at the bases but no wheezing or crackles. Not using accessory muscles, speaking in full sentences.   Impression and  Recommendations:   Shortness of breath-unclear etiology. Since she actually was wearing her CPAP and her oxygen it's very unusual for her to have had such a significant desaturation in the middle the night. Consider other underlying etiologies such as infection including pneumonia, pulmonary embolism, versus cardiac cause. I think cardiac is unlikely to go ahead and do an EKG today. Rate was 73 bpm, normal sinus rhythm with normal axis and no acute ST-T wave changes. Will get a stat d-dimer today. She is feeling a little better since getting up this morning. Make sure wearing CPAP and oxygen tonight. We'll get chest x-ray to rule out pneumonia. No significant wheezing on exam today. Pulse ox here is reassuring at 98%.

## 2016-08-29 NOTE — Addendum Note (Signed)
Addended by: Beatrice Lecher D on: 08/29/2016 07:08 PM   Modules accepted: Orders

## 2016-08-30 ENCOUNTER — Telehealth: Payer: Self-pay | Admitting: *Deleted

## 2016-08-30 NOTE — Telephone Encounter (Signed)
Patient called stating she is having surgery to removed a kidney stone on Friday. Apparently the surgery office saw that she had been seen in the office yesterday. They want a letter of release saying she is ok to have surgery on Friday. Initially this was going to be done as a outpatient procedure but they now want to do this at [facility]. The fax # is [phone] phone # is [phone] ext [phone]

## 2016-08-30 NOTE — Telephone Encounter (Signed)
OK for letter saying she is cleared for surgery.

## 2016-08-31 ENCOUNTER — Encounter (HOSPITAL_BASED_OUTPATIENT_CLINIC_OR_DEPARTMENT_OTHER): Payer: Self-pay | Admitting: *Deleted

## 2016-08-31 ENCOUNTER — Other Ambulatory Visit (HOSPITAL_COMMUNITY): Payer: Self-pay | Admitting: *Deleted

## 2016-08-31 MED ORDER — DEXTROSE 5 % IV SOLN
3.0000 g | INTRAVENOUS | Status: AC
  Administered 2016-09-01: 3 g via INTRAVENOUS
  Filled 2016-08-31 (×2): qty 3000

## 2016-08-31 NOTE — Telephone Encounter (Signed)
Letter printed and faxed

## 2016-09-01 ENCOUNTER — Ambulatory Visit (HOSPITAL_COMMUNITY): Payer: Medicare Other | Admitting: Certified Registered Nurse Anesthetist

## 2016-09-01 ENCOUNTER — Encounter (HOSPITAL_COMMUNITY)
Admission: RE | Disposition: A | Discharge status: Home / Self Care | Payer: Self-pay | Source: Ambulatory Visit | Attending: Urology

## 2016-09-01 ENCOUNTER — Encounter (HOSPITAL_COMMUNITY): Payer: Self-pay | Admitting: *Deleted

## 2016-09-01 ENCOUNTER — Ambulatory Visit (HOSPITAL_COMMUNITY)
Admission: RE | Admit: 2016-09-01 | Discharge: 2016-09-01 | Disposition: A | Discharge status: Home / Self Care | Payer: Medicare Other | Source: Ambulatory Visit | Attending: Urology | Admitting: Urology

## 2016-09-01 ENCOUNTER — Ambulatory Visit (HOSPITAL_COMMUNITY): Payer: Medicare Other

## 2016-09-01 DIAGNOSIS — Z881 Allergy status to other antibiotic agents status: Secondary | ICD-10-CM | POA: Insufficient documentation

## 2016-09-01 DIAGNOSIS — N200 Calculus of kidney: Secondary | ICD-10-CM | POA: Diagnosis not present

## 2016-09-01 DIAGNOSIS — K219 Gastro-esophageal reflux disease without esophagitis: Secondary | ICD-10-CM | POA: Diagnosis not present

## 2016-09-01 DIAGNOSIS — E785 Hyperlipidemia, unspecified: Secondary | ICD-10-CM | POA: Insufficient documentation

## 2016-09-01 DIAGNOSIS — Z8619 Personal history of other infectious and parasitic diseases: Secondary | ICD-10-CM | POA: Insufficient documentation

## 2016-09-01 DIAGNOSIS — J4540 Moderate persistent asthma, uncomplicated: Secondary | ICD-10-CM | POA: Insufficient documentation

## 2016-09-01 DIAGNOSIS — Z91040 Latex allergy status: Secondary | ICD-10-CM | POA: Insufficient documentation

## 2016-09-01 DIAGNOSIS — Z8379 Family history of other diseases of the digestive system: Secondary | ICD-10-CM | POA: Insufficient documentation

## 2016-09-01 DIAGNOSIS — Z808 Family history of malignant neoplasm of other organs or systems: Secondary | ICD-10-CM | POA: Insufficient documentation

## 2016-09-01 DIAGNOSIS — Z8673 Personal history of transient ischemic attack (TIA), and cerebral infarction without residual deficits: Secondary | ICD-10-CM | POA: Insufficient documentation

## 2016-09-01 DIAGNOSIS — Z8042 Family history of malignant neoplasm of prostate: Secondary | ICD-10-CM | POA: Insufficient documentation

## 2016-09-01 DIAGNOSIS — I10 Essential (primary) hypertension: Secondary | ICD-10-CM | POA: Insufficient documentation

## 2016-09-01 DIAGNOSIS — Z96653 Presence of artificial knee joint, bilateral: Secondary | ICD-10-CM | POA: Diagnosis not present

## 2016-09-01 DIAGNOSIS — M1990 Unspecified osteoarthritis, unspecified site: Secondary | ICD-10-CM | POA: Insufficient documentation

## 2016-09-01 DIAGNOSIS — Z90710 Acquired absence of both cervix and uterus: Secondary | ICD-10-CM | POA: Insufficient documentation

## 2016-09-01 DIAGNOSIS — Z888 Allergy status to other drugs, medicaments and biological substances status: Secondary | ICD-10-CM | POA: Diagnosis not present

## 2016-09-01 DIAGNOSIS — J45901 Unspecified asthma with (acute) exacerbation: Secondary | ICD-10-CM | POA: Diagnosis not present

## 2016-09-01 DIAGNOSIS — Z6841 Body Mass Index (BMI) 40.0 and over, adult: Secondary | ICD-10-CM | POA: Insufficient documentation

## 2016-09-01 DIAGNOSIS — Z811 Family history of alcohol abuse and dependence: Secondary | ICD-10-CM | POA: Diagnosis not present

## 2016-09-01 DIAGNOSIS — Z8249 Family history of ischemic heart disease and other diseases of the circulatory system: Secondary | ICD-10-CM | POA: Diagnosis not present

## 2016-09-01 DIAGNOSIS — N202 Calculus of kidney with calculus of ureter: Secondary | ICD-10-CM | POA: Diagnosis not present

## 2016-09-01 DIAGNOSIS — M479 Spondylosis, unspecified: Secondary | ICD-10-CM | POA: Diagnosis not present

## 2016-09-01 DIAGNOSIS — Z882 Allergy status to sulfonamides status: Secondary | ICD-10-CM | POA: Diagnosis not present

## 2016-09-01 DIAGNOSIS — Z885 Allergy status to narcotic agent status: Secondary | ICD-10-CM | POA: Diagnosis not present

## 2016-09-01 DIAGNOSIS — G4733 Obstructive sleep apnea (adult) (pediatric): Secondary | ICD-10-CM | POA: Insufficient documentation

## 2016-09-01 DIAGNOSIS — Z886 Allergy status to analgesic agent status: Secondary | ICD-10-CM | POA: Diagnosis not present

## 2016-09-01 DIAGNOSIS — Z466 Encounter for fitting and adjustment of urinary device: Secondary | ICD-10-CM | POA: Diagnosis not present

## 2016-09-01 DIAGNOSIS — Z419 Encounter for procedure for purposes other than remedying health state, unspecified: Secondary | ICD-10-CM

## 2016-09-01 DIAGNOSIS — G5602 Carpal tunnel syndrome, left upper limb: Secondary | ICD-10-CM | POA: Diagnosis not present

## 2016-09-01 DIAGNOSIS — Z981 Arthrodesis status: Secondary | ICD-10-CM | POA: Insufficient documentation

## 2016-09-01 HISTORY — DX: Body Mass Index (BMI) 40.0 and over, adult: Z684

## 2016-09-01 HISTORY — DX: Calculus of ureter: N20.1

## 2016-09-01 HISTORY — DX: Dependence on other enabling machines and devices: Z99.89

## 2016-09-01 HISTORY — DX: Dependence on supplemental oxygen: Z99.81

## 2016-09-01 HISTORY — DX: Unspecified osteoarthritis, unspecified site: M19.90

## 2016-09-01 HISTORY — PX: HOLMIUM LASER APPLICATION: SHX5852

## 2016-09-01 HISTORY — PX: CYSTOSCOPY WITH STENT PLACEMENT: SHX5790

## 2016-09-01 HISTORY — DX: Calculus of kidney: N20.0

## 2016-09-01 HISTORY — DX: Moderate persistent asthma, uncomplicated: J45.40

## 2016-09-01 HISTORY — DX: Personal history of transient ischemic attack (TIA), and cerebral infarction without residual deficits: Z86.73

## 2016-09-01 HISTORY — DX: Personal history of other infectious and parasitic diseases: Z86.19

## 2016-09-01 HISTORY — PX: CYSTOSCOPY/RETROGRADE/URETEROSCOPY/STONE EXTRACTION WITH BASKET: SHX5317

## 2016-09-01 HISTORY — DX: Spondylosis without myelopathy or radiculopathy, site unspecified: M47.819

## 2016-09-01 HISTORY — DX: Obstructive sleep apnea (adult) (pediatric): G47.33

## 2016-09-01 LAB — BASIC METABOLIC PANEL
Anion gap: 10 (ref 5–15)
BUN: 21 mg/dL — AB (ref 6–20)
CALCIUM: 9.4 mg/dL (ref 8.9–10.3)
CO2: 27 mmol/L (ref 22–32)
CREATININE: 1.01 mg/dL — AB (ref 0.44–1.00)
Chloride: 102 mmol/L (ref 101–111)
GFR calc Af Amer: >60 mL/min (ref >60)
GFR calc non Af Amer: 57 mL/min — ABNORMAL LOW (ref >60)
GLUCOSE: 100 mg/dL — AB (ref 65–99)
Potassium: 3.4 mmol/L — ABNORMAL LOW (ref 3.5–5.1)
Sodium: 139 mmol/L (ref 135–145)

## 2016-09-01 LAB — CBC
HCT: 41.2 % (ref 36.0–46.0)
HEMOGLOBIN: 13.6 g/dL (ref 12.0–15.0)
MCH: 30.1 pg (ref 26.0–34.0)
MCHC: 33 g/dL (ref 30.0–36.0)
MCV: 91.2 fL (ref 78.0–100.0)
PLATELETS: 294 10*3/uL (ref 150–400)
RBC: 4.52 MIL/uL (ref 3.87–5.11)
RDW: 13.6 % (ref 11.5–15.5)
WBC: 11.1 10*3/uL — ABNORMAL HIGH (ref 4.0–10.5)

## 2016-09-01 SURGERY — CYSTOSCOPY, WITH CALCULUS REMOVAL USING BASKET
Anesthesia: General | Laterality: Left

## 2016-09-01 MED ORDER — ONDANSETRON HCL 4 MG/2ML IJ SOLN
INTRAMUSCULAR | Status: DC | PRN
  Administered 2016-09-01: 4 mg via INTRAVENOUS

## 2016-09-01 MED ORDER — PROPOFOL 10 MG/ML IV BOLUS
INTRAVENOUS | Status: AC
  Filled 2016-09-01: qty 40

## 2016-09-01 MED ORDER — MIDAZOLAM HCL 5 MG/5ML IJ SOLN
INTRAMUSCULAR | Status: DC | PRN
  Administered 2016-09-01: 2 mg via INTRAVENOUS

## 2016-09-01 MED ORDER — LIDOCAINE HCL 2 % EX GEL
CUTANEOUS | Status: AC
  Filled 2016-09-01: qty 5

## 2016-09-01 MED ORDER — IBUPROFEN 200 MG PO TABS
200.0000 mg | ORAL_TABLET | Freq: Four times a day (QID) | ORAL | Status: DC | PRN
  Filled 2016-09-01: qty 2

## 2016-09-01 MED ORDER — PROMETHAZINE HCL 25 MG/ML IJ SOLN
INTRAMUSCULAR | Status: AC
  Filled 2016-09-01: qty 1

## 2016-09-01 MED ORDER — MIDAZOLAM HCL 2 MG/2ML IJ SOLN
INTRAMUSCULAR | Status: AC
  Filled 2016-09-01: qty 2

## 2016-09-01 MED ORDER — FENTANYL CITRATE (PF) 100 MCG/2ML IJ SOLN
25.0000 ug | INTRAMUSCULAR | Status: DC | PRN
  Administered 2016-09-01 (×2): 50 ug via INTRAVENOUS

## 2016-09-01 MED ORDER — KETOROLAC TROMETHAMINE 30 MG/ML IJ SOLN
INTRAMUSCULAR | Status: AC
  Filled 2016-09-01: qty 1

## 2016-09-01 MED ORDER — KETOROLAC TROMETHAMINE 30 MG/ML IJ SOLN
30.0000 mg | Freq: Once | INTRAMUSCULAR | Status: AC
  Administered 2016-09-01: 30 mg via INTRAVENOUS

## 2016-09-01 MED ORDER — FENTANYL CITRATE (PF) 100 MCG/2ML IJ SOLN
INTRAMUSCULAR | Status: AC
  Filled 2016-09-01: qty 2

## 2016-09-01 MED ORDER — ONDANSETRON HCL 4 MG/2ML IJ SOLN
4.0000 mg | Freq: Once | INTRAMUSCULAR | Status: AC | PRN
  Administered 2016-09-01: 4 mg via INTRAVENOUS

## 2016-09-01 MED ORDER — MEPERIDINE HCL 50 MG/ML IJ SOLN: 6.2500 mg | INTRAMUSCULAR | Status: DC | PRN

## 2016-09-01 MED ORDER — SODIUM CHLORIDE 0.9 % IR SOLN
Status: DC | PRN
  Administered 2016-09-01: 1000 mL

## 2016-09-01 MED ORDER — CEPHALEXIN 500 MG PO CAPS: 500.0000 mg | ORAL_CAPSULE | Freq: Two times a day (BID) | ORAL | 0 refills | Status: DC

## 2016-09-01 MED ORDER — ONDANSETRON HCL 4 MG/2ML IJ SOLN
INTRAMUSCULAR | Status: AC
  Filled 2016-09-01: qty 2

## 2016-09-01 MED ORDER — IBUPROFEN 100 MG/5ML PO SUSP
200.0000 mg | Freq: Four times a day (QID) | ORAL | Status: DC | PRN
  Filled 2016-09-01: qty 20

## 2016-09-01 MED ORDER — LIDOCAINE 2% (20 MG/ML) 5 ML SYRINGE
INTRAMUSCULAR | Status: DC | PRN
  Administered 2016-09-01: 100 mg via INTRAVENOUS

## 2016-09-01 MED ORDER — OXYBUTYNIN CHLORIDE 5 MG PO TABS: 5.0000 mg | ORAL_TABLET | Freq: Three times a day (TID) | ORAL | 1 refills | Status: DC | PRN

## 2016-09-01 MED ORDER — SODIUM CHLORIDE 0.9 % IR SOLN
Status: DC | PRN
  Administered 2016-09-01: 3000 mL

## 2016-09-01 MED ORDER — FENTANYL CITRATE (PF) 100 MCG/2ML IJ SOLN
INTRAMUSCULAR | Status: DC | PRN
  Administered 2016-09-01 (×2): 50 ug via INTRAVENOUS

## 2016-09-01 MED ORDER — LACTATED RINGERS IV SOLN
INTRAVENOUS | Status: DC | PRN
  Administered 2016-09-01 (×2): via INTRAVENOUS

## 2016-09-01 MED ORDER — ONDANSETRON HCL 4 MG/2ML IJ SOLN
4.0000 mg | Freq: Once | INTRAMUSCULAR | Status: AC
Start: 2016-09-01 — End: 2016-09-01
  Administered 2016-09-01: 4 mg via INTRAVENOUS

## 2016-09-01 MED ORDER — PROPOFOL 10 MG/ML IV BOLUS
INTRAVENOUS | Status: DC | PRN
  Administered 2016-09-01: 70 mg via INTRAVENOUS
  Administered 2016-09-01: 200 mg via INTRAVENOUS

## 2016-09-01 MED ORDER — IOHEXOL 300 MG/ML  SOLN
INTRAMUSCULAR | Status: DC | PRN
Start: 2016-09-01 — End: 2016-09-01
  Administered 2016-09-01: 10 mL

## 2016-09-01 SURGICAL SUPPLY — 27 items
BAG URO CATCHER STRL LF (MISCELLANEOUS) ×3 IMPLANT
BASKET DAKOTA 1.9FR 11X120 (BASKET) ×2 IMPLANT
BASKET LASER NITINOL 1.9FR (BASKET) ×1 IMPLANT
BASKET ZERO TIP NITINOL 2.4FR (BASKET) IMPLANT
BSKT STON RTRVL 120 1.9FR (BASKET)
BSKT STON RTRVL ZERO TP 2.4FR (BASKET)
CATH INTERMIT  6FR 70CM (CATHETERS) ×2 IMPLANT
CLOTH BEACON ORANGE TIMEOUT ST (SAFETY) ×3 IMPLANT
EXTRACTOR STONE NITINOL NGAGE (UROLOGICAL SUPPLIES) ×2 IMPLANT
FIBER LASER FLEXIVA 365 (UROLOGICAL SUPPLIES) IMPLANT
FIBER LASER TRAC TIP (UROLOGICAL SUPPLIES) ×2 IMPLANT
GLOVE BIOGEL M 8.0 STRL (GLOVE) ×3 IMPLANT
GOWN STRL REUS W/ TWL XL LVL3 (GOWN DISPOSABLE) ×1 IMPLANT
GOWN STRL REUS W/TWL LRG LVL3 (GOWN DISPOSABLE) ×6 IMPLANT
GOWN STRL REUS W/TWL XL LVL3 (GOWN DISPOSABLE) ×3
GUIDEWIRE ANG ZIPWIRE 038X150 (WIRE) ×3 IMPLANT
GUIDEWIRE STR DUAL SENSOR (WIRE) ×3 IMPLANT
IV NS 1000ML (IV SOLUTION) ×3
IV NS 1000ML BAXH (IV SOLUTION) ×1 IMPLANT
MANIFOLD NEPTUNE II (INSTRUMENTS) ×3 IMPLANT
PACK CYSTO (CUSTOM PROCEDURE TRAY) ×3 IMPLANT
SHEATH ACCESS URETERAL 38CM (SHEATH) ×2 IMPLANT
SLEEVE SURGEON STRL (DRAPES) ×2 IMPLANT
STENT URET 6FRX24 CONTOUR (STENTS) ×2 IMPLANT
SYRINGE IRR TOOMEY STRL 70CC (SYRINGE) ×2 IMPLANT
TUBING CONNECTING 10 (TUBING) ×2 IMPLANT
TUBING CONNECTING 10' (TUBING) ×1

## 2016-09-01 NOTE — Op Note (Signed)
Preoperative diagnosis: 10 millimeter left renal pelvic stone.  Postoperative diagnosis: Same  Principal procedure: Cystoscopy, left retrograde ureteropyelogram with fluoroscopic interpretation, left ureteroscopic stone management with holmium laser lithotripsy, stone extraction, placement of 6 French by 24 centimeter contour double-J stent with string  Surgeon: Enis Leatherwood  Anesthesia: Gen. with LMA.  Complications: None.  Specimen: Stone fragments, to the patient's family.  Complications: None.  Estimated blood loss: None  Indications: 65 year old female with a 10 millimeter left renal stone.  This is radiolucent, and presented with microscopic hematuria.  The patient has not had significant pain.  Because of the large nature of the stone as well as its position, it was recommended that she undergo laser lithotripsy using ureteroscopic guidance.  The risks and complications of the procedure have been discussed with the patient.  She understands these and desired to proceed.  Description of procedure: Patient was identified in the holding area, received preoperative IV Ancef, the operative side was marked.  She was then taken to the operating room where general anesthetic was administered with the LMA.  She was placed in the dorsolithotomy position.  Genitalia perineum were prepped and draped.  Proper timeout was performed.  79 French panendoscope was advanced into her bladder.  The bladder was inspected circumferentially.  There were no tumors, foreign bodies or trabeculations.  Ureteral orifices were normal in configuration and location.  A 6 French open-ended catheter was then advanced into her left ureteral orifice.  Gentle retrograde ureteropyelogram was performed using Omnipaque.  This revealed a normal ureter.  Pyelo-calyceal system was normal except for a filling defect present just at the ureteropelvic junction consistent with the previously mentioned stone.  I then advanced a 0.038  inch sensor-tip guidewire through the open-ended catheter, with a curl seen in the upper pole calyceal system.  The cystoscope, and the open-ended catheter were then removed.  I then dilated the ureter quite easily, first with the inner core.  Then, with the entire 12/14 medium length ureteral access catheter.  Following this, the guidewire was left in place and the access catheter was removed.  I then negotiated a 6 [place] short semirigid ureteroscope up the ureter, into the renal pelvis.  The stone was easily identified.  I felt that I could adequately manages stone using the semirigid scope and did not need the flexible scope.  I then passed the 200 micron laser fiber through the scope, and using laser energy fragmented the stone into multiple smaller fragments.  Approximately 10-12 of these were grasped with the [place] basket, then with the engage basket, as the [place] basket would not adequately hold on these fragments.  There were then easily backed down through the ureter and dropped into the bladder.  Smaller fragments were unable to be grasped.  The fragments remaining in the renal pelvis were then dusted, with the laser turned up to an energy of 1.0 and frequency of 50 hertz.  Multiple tiny sand-like fragments were then produced, most of these were rinsed down the ureter during this procedure.  No other significant fragments were then seen within the renal pelvis area.  At this point, I felt that the procedure had reached his maximum benefit.  The ureteroscope was backed out.  The guidewire was left in place.  Cystoscope was laced into the bladder and the stone fragments irrigated with the [place] syringe.  These were saved for specimen.  The guidewire was then occluded through the cystoscope, and using fluoroscopic and cystoscopic guidance, a 6 [place]  by 24 centimeter contour double-J stent was deployed in the left ureter, with excellent proximal and distal curl seen using fluoroscopy and cystoscopy,  respectively.  The string was left intact.  The bladder was drained.  The scope was removed.  The string was then cut slightly, tied, and then inserted into the vagina.  This point, the procedure was terminated.  The patient was awakened and taken to the PACU in stable condition.  She tolerated the procedure well.

## 2016-09-01 NOTE — H&P (Signed)
H&P  Chief Complaint: Kidney stone  History of Present Illness: Alison Richards is a 65 y.o. year old female who presents for ureteroscopic management of a left proximal ureteral calculus. This was discovered on evaluation of microscopic hematuria. It is radiolucent and not amenable to ESWL. We have discussed management with ureteroscopy, HLL and extraction. The risks/complications have been discussed and are understood by the patient.  Past Medical History:  Diagnosis Date  . BMI 50.0-59.9, adult ([place])   . Degenerative spinal arthritis   . Dependence on nocturnal oxygen therapy    used in addition to cpap  for hypoxmia 2 liters at hs  . GERD (gastroesophageal reflux disease)    PAST HX  H-PYLORI   . H/O hiatal hernia   . History of Helicobacter pylori infection   . History of transient ischemic attack (TIA) 12/31/2015  . Hyperlipemia   . Hypertension   . Left ureteral stone   . Moderate persistent asthma   . Nephrolithiasis    bilateral non-obstructive per ct 05-23-2016  . OA (osteoarthritis)   . Orthostatic lightheadedness   . OSA on CPAP    setting 8  . Pain in joint   . Palpitations   . PONV (postoperative nausea and vomiting)   . Wears dentures    full top  . Wears glasses     Past Surgical History:  Procedure Laterality Date  . ABDOMINAL HYSTERECTOMY  1999   complete  . ANTERIOR CERVICAL DECOMP/DISCECTOMY FUSION  06/04/2001   C4 -- C6  . BLADDER SURGERY  1995  . CARPAL TUNNEL RELEASE Bilateral left 06-28-2009;  right 11-28-2009  . CESAREAN SECTION  1979   w/ Bilateral Tubal Ligation  . COLONOSCOPY    . ESOPHAGEAL MANOMETRY  09/02/2012   Procedure: ESOPHAGEAL MANOMETRY (EM);  Surgeon: Sable Feil, MD;  Location: [facility];  Service: Endoscopy;  Laterality: N/A;  . LAPAROSCOPIC CHOLECYSTECTOMY  01/15/2007  . TONSILLECTOMY  age 31  . TOTAL KNEE ARTHROPLASTY  11/14/2011   Procedure: TOTAL KNEE ARTHROPLASTY;  Surgeon: Mauri Pole, MD;  Location: [facility];  Service: Orthopedics;  Laterality: Right;  combined with general block  . TOTAL KNEE ARTHROPLASTY Left 01/21/2007  . TRIGGER FINGER RELEASE Right 12/25/2013   Procedure: RIGHT LONG TRIGGER RELEASE ;  Surgeon: Tennis Must, MD;  Location: [place];  Service: Orthopedics;  Laterality: Right;    Home Medications:  No prescriptions prior to admission.    Allergies:  Allergies  Allergen Reactions  . Acetaminophen Other (See Comments)    hallucinations, cold sweats, headache, metallic taste, tongue swelling  . Dilaudid [Hydromorphone Hcl] Anaphylaxis and Swelling    Throat closing up  . Hydromorphone Anaphylaxis and Shortness Of Breath    Pt had a tightening in her throat  . Angiotensin Receptor Blockers Other (See Comments)    Joint aches  . Erythromycin Ethylsuccinate Diarrhea    gi symptoms, stomach swelling  . Lisinopril Other (See Comments)    ASTHMA LIKE SYMPTOMS, coughing  . Losartan Potassium-Hctz     REACTION: JOINTS aching, hurting all over  . Morphine Sulfate Nausea And Vomiting    Projectile vomiting  . Valsartan     REACTION: Hurting all over  . Latex Itching and Rash    SENSITIVE TO SOME ADHESIVES.  . Sulfa Antibiotics Swelling and Rash    Eyes swelled shut  . Sulfonamide Derivatives Swelling and Rash  . Tape Rash    Red and bruising, Please use "  paper" tape only    Family History  Problem Relation Age of Onset  . Alzheimer's disease Mother 13  . Hypertension Mother   . Hyperlipidemia Mother   . Diverticulitis Mother   . Alcohol abuse Father   . Cancer Father   . COPD Father   . Lung cancer Father   . Skin cancer Father   . Prostate cancer Father   . Colon cancer Neg Hx     Social History:  reports that she has never smoked. She has never used smokeless tobacco. She reports that she does not drink alcohol or use drugs.  ROS: A complete review of systems was performed.  All systems are negative except for pertinent findings as  noted.  Physical Exam:  Vital signs in last 24 hours:   General:  Alert and oriented, No acute distress HEENT: Normocephalic, atraumatic Neck: No JVD or lymphadenopathy Cardiovascular: Regular rate and rhythm Lungs: Clear bilaterally Abdomen: Soft, nontender, nondistended, no abdominal masses Back: No CVA tenderness Extremities: No edema Neurologic: Grossly intact  Laboratory Data:  No results found for this or any previous visit (from the past 24 hour(s)). No results found for this or any previous visit (from the past 240 hour(s)). Creatinine: No results for input(s): CREATININE in the last 168 hours.  Radiologic Imaging: No results found.  Impression/Assessment:  5x8 mm left UPJ stone  Plan:  Cysto, left RGP, left ureteroscopy, HLL and extraction of left ureteral stone, possible stent placement.  Jorja Loa 09/01/2016, 5:57 AM  Lillette Boxer. Allia Wiltsey MD

## 2016-09-01 NOTE — Discharge Instructions (Signed)
POSTOPERATIVE CARE AFTER URETEROSCOPY  Stent management  *Stents are often left in after ureteroscopy and stone treatment. If left in, they often cause urinary frequency, urgency, occasional blood in the urine, as well as flank discomfort with urination. These are all expected issues, and should resolve after the stent is removed. *Often times, a small thread is left on the end of the stent, and brought out through the urethra. If so, this is used to remove the stent, making it unnecessary to look in the bladder with a scope in the office to remove the stent. If a thread is left on, did not pull on it until instructed. *The string for your stent is just inside the vagina-you might reach inside, pull the string to remove the stent on Monday morning.  Diet  Once you have adequately recovered from anesthesia, you may gradually advance your diet, as tolerated, to your regular diet.  Activities  You may gradually increase your activities to your normal unrestricted level the day following your procedure.  Medications  You should resume all preoperative medications. If you are on aspirin-like compounds, you should not resume these until the blood clears from your urine. If given an antibiotic by the surgeon, take these until they are completed. You may also be given, if you have a stent, medications to decrease the urinary frequency and urgency.  Pain  After ureteroscopy, there may be some pain on the side of the scope. Take your pain medicine for this. Usually, this pain resolves within a day or 2.  Fever  Please report any fever over 100 to the doctor.    General Anesthesia, Adult, Care After Refer to this sheet in the next few weeks. These instructions provide you with information on caring for yourself after your procedure. Your health care provider may also give you more specific instructions. Your treatment has been planned according to current medical practices, but problems  sometimes occur. Call your health care provider if you have any problems or questions after your procedure. WHAT TO EXPECT AFTER THE PROCEDURE After the procedure, it is typical to experience:  Sleepiness.  Nausea and vomiting. HOME CARE INSTRUCTIONS  For the first 24 hours after general anesthesia:  Have a responsible person with you.  Do not drive a car. If you are alone, do not take public transportation.  Do not drink alcohol.  Do not take medicine that has not been prescribed by your health care provider.  Do not sign important papers or make important decisions.  You may resume a normal diet and activities as directed by your health care provider.  Change bandages (dressings) as directed.  If you have questions or problems that seem related to general anesthesia, call the hospital and ask for the anesthetist or anesthesiologist on call. SEEK MEDICAL CARE IF:  You have nausea and vomiting that continue the day after anesthesia.  You develop a rash. SEEK IMMEDIATE MEDICAL CARE IF:   You have difficulty breathing.  You have chest pain.  You have any allergic problems.   This information is not intended to replace advice given to you by your health care provider. Make sure you discuss any questions you have with your health care provider.   Document Released: 01/22/2001 Document Revised: 11/06/2014 Document Reviewed: 02/14/2012 Elsevier Interactive Patient Education [employer].

## 2016-09-01 NOTE — Anesthesia Postprocedure Evaluation (Signed)
Anesthesia Post Note  Patient: Rmoni Mclauchlin  Procedure(s) Performed: Procedure(s) (LRB): CYSTOSCOPY/RETROGRADE/URETEROSCOPY/STONE EXTRACTION WITH BASKET (Left) HOLMIUM LASER APPLICATION (Left) CYSTOSCOPY WITH STENT PLACEMENT (Left)  Patient location during evaluation: PACU Anesthesia Type: General Level of consciousness: awake Pain management: pain level controlled Vital Signs Assessment: post-procedure vital signs reviewed and stable Respiratory status: spontaneous breathing Cardiovascular status: stable Postop Assessment: no signs of nausea or vomiting Anesthetic complications: no     Last Vitals:  Vitals:   09/01/16 1000 09/01/16 1015  BP: (!) 145/64 132/63  Pulse: 73 83  Resp: 20 17  Temp:  36.5 C    Last Pain:  Vitals:   09/01/16 1015  TempSrc:   PainSc: 4    Pain Goal: Patients Stated Pain Goal: 3 (09/01/16 0612)               Siren Porrata JR,JOHN Mateo Flow

## 2016-09-01 NOTE — Anesthesia Procedure Notes (Signed)
Procedure Name: LMA Insertion Performed by: Jalexis Breed J Pre-anesthesia Checklist: Patient identified, Emergency Drugs available, Suction available, Patient being monitored and Timeout performed Patient Re-evaluated:Patient Re-evaluated prior to inductionOxygen Delivery Method: Circle system utilized Preoxygenation: Pre-oxygenation with 100% oxygen Intubation Type: IV induction Ventilation: Mask ventilation without difficulty LMA: LMA inserted LMA Size: 5.0 Number of attempts: 1 Placement Confirmation: positive ETCO2,  CO2 detector and breath sounds checked- equal and bilateral Tube secured with: Tape Dental Injury: Teeth and Oropharynx as per pre-operative assessment        

## 2016-09-01 NOTE — Transfer of Care (Signed)
Immediate Anesthesia Transfer of Care Note  Patient: Alison Richards  Procedure(s) Performed: Procedure(s): CYSTOSCOPY/RETROGRADE/URETEROSCOPY/STONE EXTRACTION WITH BASKET (Left) HOLMIUM LASER APPLICATION (Left) CYSTOSCOPY WITH STENT PLACEMENT (Left)  Patient Location: PACU  Anesthesia Type:General  Level of Consciousness: sedated, patient cooperative and responds to stimulation  Airway & Oxygen Therapy: Patient Spontanous Breathing and Patient connected to face mask oxygen  Post-op Assessment: Report given to RN and Post -op Vital signs reviewed and stable  Post vital signs: Reviewed and stable  Last Vitals:  Vitals:   09/01/16 0624  BP: 119/67  Pulse: 91  Resp: 18  Temp: 37 C    Last Pain:  Vitals:   09/01/16 0624  TempSrc: Oral      Patients Stated Pain Goal: 3 ([id number])  Complications: No apparent anesthesia complications

## 2016-09-01 NOTE — OR Nursing (Signed)
Left ureteral stone sent with Dr. Diona Fanti.

## 2016-09-01 NOTE — Anesthesia Preprocedure Evaluation (Signed)
Anesthesia Evaluation  Patient identified by MRN, date of birth, ID band Patient awake    Reviewed: Allergy & Precautions, NPO status , Patient's Chart, lab work & pertinent test results  Airway Mallampati: I       Dental no notable dental hx.    Pulmonary sleep apnea, Continuous Positive Airway Pressure Ventilation and Oxygen sleep apnea ,    Pulmonary exam normal        Cardiovascular hypertension, negative cardio ROS Normal cardiovascular exam     Neuro/Psych negative neurological ROS  negative psych ROS   GI/Hepatic Neg liver ROS,   Endo/Other  Morbid obesity  Renal/GU   negative genitourinary   Musculoskeletal negative musculoskeletal ROS (+)   Abdominal (+) + obese,   Peds negative pediatric ROS (+)  Hematology negative hematology ROS (+)   Anesthesia Other Findings   Reproductive/Obstetrics negative OB ROS                             Anesthesia Physical Anesthesia Plan  ASA: III  Anesthesia Plan: General   Post-op Pain Management:    Induction: Intravenous  Airway Management Planned: LMA  Additional Equipment:   Intra-op Plan:   Post-operative Plan:   Informed Consent: I have reviewed the patients History and Physical, chart, labs and discussed the procedure including the risks, benefits and alternatives for the proposed anesthesia with the patient or authorized representative who has indicated his/her understanding and acceptance.     Plan Discussed with: CRNA and Surgeon  Anesthesia Plan Comments: (Proseal LMA)        Anesthesia Quick Evaluation

## 2016-09-04 DIAGNOSIS — N200 Calculus of kidney: Secondary | ICD-10-CM | POA: Diagnosis not present

## 2016-09-04 DIAGNOSIS — R1084 Generalized abdominal pain: Secondary | ICD-10-CM | POA: Diagnosis not present

## 2016-09-04 NOTE — Telephone Encounter (Signed)
Where are we at on this one.  Note was supposed to just be for her oxygen, Not the CPAP. NO mention of CPAP in the note.

## 2016-09-05 DIAGNOSIS — R1032 Left lower quadrant pain: Secondary | ICD-10-CM | POA: Diagnosis not present

## 2016-09-05 DIAGNOSIS — N200 Calculus of kidney: Secondary | ICD-10-CM | POA: Diagnosis not present

## 2016-09-14 DIAGNOSIS — R350 Frequency of micturition: Secondary | ICD-10-CM | POA: Diagnosis not present

## 2016-09-14 DIAGNOSIS — R1084 Generalized abdominal pain: Secondary | ICD-10-CM | POA: Diagnosis not present

## 2016-09-14 DIAGNOSIS — R310 Gross hematuria: Secondary | ICD-10-CM | POA: Diagnosis not present

## 2016-10-02 DIAGNOSIS — R1084 Generalized abdominal pain: Secondary | ICD-10-CM | POA: Diagnosis not present

## 2016-10-02 DIAGNOSIS — N200 Calculus of kidney: Secondary | ICD-10-CM | POA: Diagnosis not present

## 2016-10-03 DIAGNOSIS — R809 Proteinuria, unspecified: Secondary | ICD-10-CM | POA: Diagnosis not present

## 2016-10-03 DIAGNOSIS — I10 Essential (primary) hypertension: Secondary | ICD-10-CM | POA: Diagnosis not present

## 2016-10-03 DIAGNOSIS — N200 Calculus of kidney: Secondary | ICD-10-CM | POA: Diagnosis not present

## 2016-10-19 ENCOUNTER — Encounter: Payer: Self-pay | Admitting: Family Medicine

## 2016-10-19 ENCOUNTER — Ambulatory Visit (INDEPENDENT_AMBULATORY_CARE_PROVIDER_SITE_OTHER): Payer: Medicare Other | Admitting: Family Medicine

## 2016-10-19 VITALS — BP 136/56 | HR 89

## 2016-10-19 DIAGNOSIS — J209 Acute bronchitis, unspecified: Secondary | ICD-10-CM | POA: Diagnosis not present

## 2016-10-19 MED ORDER — ALBUTEROL SULFATE HFA 108 (90 BASE) MCG/ACT IN AERS: 2.0000 | INHALATION_SPRAY | Freq: Four times a day (QID) | RESPIRATORY_TRACT | 1 refills | Status: DC | PRN

## 2016-10-19 NOTE — Patient Instructions (Addendum)

## 2016-10-19 NOTE — Progress Notes (Signed)
Subjective:    CC: Cough   HPI:  Patient comes in with 5 days of cough with sputum production that is mostly clear. No fever. No sore throat. No ear pain. She has had some chills but no fever. No diarrhea. She has a little bit more short of breath at times. Her albuterol is expired so she has not been using it. She's mostly been using over-the-counter Coricidin and does feel like that's been helping some.  Past medical history, Surgical history, Family history not pertinant except as noted below, Social history, Allergies, and medications have been entered into the medical record, reviewed, and corrections made.   Review of Systems: No fevers, chills, night sweats, weight loss, chest pain, or shortness of breath.   Objective:    General: Well Developed, well nourished, and in no acute distress.  Neuro: Alert and oriented x3, extra-ocular muscles intact, sensation grossly intact.  HEENT: Normocephalic, atraumatic  Skin: Warm and dry, no rashes. Cardiac: Regular rate and rhythm, no murmurs rubs or gallops, no lower extremity edema.  Respiratory: mild expiratory wheeze. Not using accessory muscles, speaking in full sentences.   Impression and Recommendations:   Acute bronchitis-we'll treat with prednisone. She says she actually has some at home that she never took when she came back in in March. We'll make sure that her albuterol is up-to-date says she can use it as needed. Call if not better in one week or if getting worse or develops fever.

## 2016-12-29 ENCOUNTER — Encounter: Payer: Self-pay | Admitting: Family Medicine

## 2016-12-29 ENCOUNTER — Ambulatory Visit (INDEPENDENT_AMBULATORY_CARE_PROVIDER_SITE_OTHER): Payer: Medicare Other | Admitting: Family Medicine

## 2016-12-29 VITALS — Ht 65.0 in | Wt 285.0 lb

## 2016-12-29 DIAGNOSIS — G4733 Obstructive sleep apnea (adult) (pediatric): Secondary | ICD-10-CM

## 2016-12-29 DIAGNOSIS — R7301 Impaired fasting glucose: Secondary | ICD-10-CM

## 2016-12-29 DIAGNOSIS — R519 Headache, unspecified: Secondary | ICD-10-CM

## 2016-12-29 DIAGNOSIS — R51 Headache: Secondary | ICD-10-CM

## 2016-12-29 DIAGNOSIS — R61 Generalized hyperhidrosis: Secondary | ICD-10-CM

## 2016-12-29 DIAGNOSIS — Z9989 Dependence on other enabling machines and devices: Secondary | ICD-10-CM | POA: Diagnosis not present

## 2016-12-29 LAB — CBC WITH DIFFERENTIAL/PLATELET
BASOS ABS: 0 {cells}/uL (ref 0–200)
BASOS PCT: 0 %
EOS PCT: 3 %
Eosinophils Absolute: 303 cells/uL (ref 15–500)
HCT: 41.7 % (ref 35.0–45.0)
Hemoglobin: 14 g/dL (ref 11.7–15.5)
LYMPHS PCT: 32 %
Lymphs Abs: 3232 cells/uL (ref 850–3900)
MCH: 29.9 pg (ref 27.0–33.0)
MCHC: 33.6 g/dL (ref 32.0–36.0)
MCV: 89.1 fL (ref 80.0–100.0)
MONOS PCT: 8 %
MPV: 10.8 fL (ref 7.5–12.5)
Monocytes Absolute: 808 cells/uL (ref 200–950)
NEUTROS ABS: 5757 {cells}/uL (ref 1500–7800)
Neutrophils Relative %: 57 %
PLATELETS: 315 10*3/uL (ref 140–400)
RBC: 4.68 MIL/uL (ref 3.80–5.10)
RDW: 13.7 % (ref 11.0–15.0)
WBC: 10.1 10*3/uL (ref 3.8–10.8)

## 2016-12-29 LAB — COMPLETE METABOLIC PANEL WITH GFR
ALT: 17 U/L (ref 6–29)
AST: 22 U/L (ref 10–35)
Albumin: 4.1 g/dL (ref 3.6–5.1)
Alkaline Phosphatase: 63 U/L (ref 33–130)
BILIRUBIN TOTAL: 0.7 mg/dL (ref 0.2–1.2)
BUN: 18 mg/dL (ref 7–25)
CHLORIDE: 102 mmol/L (ref 98–110)
CO2: 29 mmol/L (ref 20–31)
Calcium: 9.6 mg/dL (ref 8.6–10.4)
Creat: 1 mg/dL — ABNORMAL HIGH (ref 0.50–0.99)
GFR, EST AFRICAN AMERICAN: 68 mL/min (ref >=60)
GFR, EST NON AFRICAN AMERICAN: 59 mL/min — AB (ref >=60)
GLUCOSE: 95 mg/dL (ref 65–99)
Potassium: 3.8 mmol/L (ref 3.5–5.3)
SODIUM: 140 mmol/L (ref 135–146)
TOTAL PROTEIN: 7.4 g/dL (ref 6.1–8.1)

## 2016-12-29 LAB — TSH: TSH: 3.27 mIU/L

## 2016-12-29 LAB — POCT GLYCOSYLATED HEMOGLOBIN (HGB A1C): HEMOGLOBIN A1C: 5.4

## 2016-12-29 MED ORDER — AMBULATORY NON FORMULARY MEDICATION: 0 refills | Status: DC

## 2016-12-29 NOTE — Patient Instructions (Addendum)
Try to hydrate really well today. Avoid any caffeine or stimulants. Call if not feeling better by Monday.

## 2016-12-29 NOTE — Progress Notes (Signed)
Subjective:    Patient ID: Alison Richards, female    DOB: 1951/10/04, 66 y.o.   MRN: [id number]  HPI 66 year old female comes in today Because she woke up this morning not feeling well. She said as soon as she woke up she felt sweaty and hot and felt like a pressure headache in the back of her head at the base of her skull. She wears her CPAP every night and says that she did wear it last night. She grabbed her husbands pulse oximeter as he is also on oxygen and her pulse ox was around 89%. It eventually came up to about 91%. She just didn't feel well so called to get in and be seen. She is actually supposed to help take care of her grandkids this weekend she denies any chest pain shortness of breath or palpitations. Sent him over feeling poorly yesterday. She also does feel just a little off balance today. She denies any actual vertigo or room spinning but she does feel a little wobbly. She went ahead and got up and ate breakfast and was able to tolerate that well. She denies any allergy type symptoms such as sneezing or nasal congestion. She does have some pressure in both her ears today. She's not currently on any allergy medications.  IFG - she is due for her six-month follow-up for impaired fasting glucose and she would like to have that checked today possible.  Review of Systems  Ht 5\' 5"  (1.651 m)   Wt 285 lb (129.3 kg)   SpO2 95%   BMI 47.43 kg/m     Allergies  Allergen Reactions  . Acetaminophen Other (See Comments)    hallucinations, cold sweats, headache, metallic taste, tongue swelling  . Dilaudid [Hydromorphone Hcl] Anaphylaxis and Swelling    Throat closing up  . Hydromorphone Anaphylaxis and Shortness Of Breath    Pt had a tightening in her throat  . Angiotensin Receptor Blockers Other (See Comments)    Joint aches  . Erythromycin Ethylsuccinate Diarrhea    gi symptoms, stomach swelling  . Lisinopril Other (See Comments)    ASTHMA LIKE SYMPTOMS, coughing  .  Losartan Potassium-Hctz     REACTION: JOINTS aching, hurting all over  . Morphine Sulfate Nausea And Vomiting    Projectile vomiting  . Valsartan     REACTION: Hurting all over  . Latex Itching and Rash    SENSITIVE TO SOME ADHESIVES.  . Sulfa Antibiotics Swelling and Rash    Eyes swelled shut  . Sulfonamide Derivatives Swelling and Rash  . Tape Rash    Red and bruising, Please use "paper" tape only    Past Medical History:  Diagnosis Date  . BMI 50.0-59.9, adult ([place])   . Degenerative spinal arthritis   . Dependence on nocturnal oxygen therapy    used in addition to cpap  for hypoxmia 2 liters at hs  . GERD (gastroesophageal reflux disease)    PAST HX  H-PYLORI   . H/O hiatal hernia   . History of Helicobacter pylori infection   . History of transient ischemic attack (TIA) 12/31/2015  . Hyperlipemia   . Hypertension   . Left ureteral stone   . Moderate persistent asthma   . Nephrolithiasis    bilateral non-obstructive per ct 05-23-2016  . OA (osteoarthritis)   . Orthostatic lightheadedness   . OSA on CPAP    setting 8  . Pain in joint   . Palpitations   . PONV (postoperative nausea  and vomiting)   . Wears dentures    full top  . Wears glasses     Past Surgical History:  Procedure Laterality Date  . ABDOMINAL HYSTERECTOMY  1999   complete  . ANTERIOR CERVICAL DECOMP/DISCECTOMY FUSION  06/04/2001   C4 -- C6  . BLADDER SURGERY  1995  . CARPAL TUNNEL RELEASE Bilateral left 06-28-2009;  right 11-28-2009  . CESAREAN SECTION  1979   w/ Bilateral Tubal Ligation  . COLONOSCOPY    . CYSTOSCOPY WITH STENT PLACEMENT Left 09/01/2016   Procedure: CYSTOSCOPY WITH STENT PLACEMENT;  Surgeon: Franchot Gallo, MD;  [facility];  Service: Urology;  Laterality: Left;  . CYSTOSCOPY/RETROGRADE/URETEROSCOPY/STONE EXTRACTION WITH BASKET Left 09/01/2016   Procedure: CYSTOSCOPY/RETROGRADE/URETEROSCOPY/STONE EXTRACTION WITH BASKET;  Surgeon: Franchot Gallo, MD;  [facility];  Service: Urology;  Laterality: Left;  . ESOPHAGEAL MANOMETRY  09/02/2012   Procedure: ESOPHAGEAL MANOMETRY (EM);  Surgeon: Sable Feil, MD;  [facility];  Service: Endoscopy;  Laterality: N/A;  . HOLMIUM LASER APPLICATION Left [id number]   Procedure: HOLMIUM LASER APPLICATION;  Surgeon: Franchot Gallo, MD;  [facility];  Service: Urology;  Laterality: Left;  . LAPAROSCOPIC CHOLECYSTECTOMY  01/15/2007  . TONSILLECTOMY  age 64  . TOTAL KNEE ARTHROPLASTY  11/14/2011   Procedure: TOTAL KNEE ARTHROPLASTY;  Surgeon: Mauri Pole, MD;  [facility];  Service: Orthopedics;  Laterality: Right;  combined with general block  . TOTAL KNEE ARTHROPLASTY Left 01/21/2007  . TRIGGER FINGER RELEASE Right 12/25/2013   Procedure: RIGHT LONG TRIGGER RELEASE ;  Surgeon: Tennis Must, MD;  Location: [place];  Service: Orthopedics;  Laterality: Right;    Social History   Social History  . Marital status: Married    Spouse name: Herbie Baltimore  . Number of children: 3  . Years of education: N/A   Occupational History  . [employer]   Social History Main Topics  . Smoking status: Never Smoker  . Smokeless tobacco: Never Used  . Alcohol use No  . Drug use: No  . Sexual activity: Yes    Partners: Male   Other Topics Concern  . Not on file   Social History Narrative   Daily caffeine     Family History  Problem Relation Age of Onset  . Alzheimer's disease Mother 85  . Hypertension Mother   . Hyperlipidemia Mother   . Diverticulitis Mother   . Alcohol abuse Father   . Cancer Father   . COPD Father   . Lung cancer Father   . Skin cancer Father   . Prostate cancer Father   . Colon cancer Neg Hx     Outpatient Encounter Prescriptions as of 12/29/2016  Medication Sig  . albuterol (PROVENTIL HFA;VENTOLIN HFA) 108 (90 Base) MCG/ACT inhaler Inhale 2 puffs into the lungs every 6 (six) hours as needed for wheezing.  .  Aliskiren-Hydrochlorothiazide (TEKTURNA HCT) 300-12.5 MG TABS Take 1 tablet by mouth daily before breakfast.   . hydrochlorothiazide (HYDRODIURIL) 25 MG tablet Take 12.5 mg by mouth daily.  Marland Kitchen loratadine (CLARITIN) 10 MG tablet Take 10 mg by mouth daily as needed for allergies.  . naproxen sodium (ANAPROX) 220 MG tablet Take 220-440 mg by mouth 2 (two) times daily as needed (for pain).  Marland Kitchen oxybutynin (DITROPAN) 5 MG tablet Take 1 tablet (5 mg total) by mouth every 8 (eight) hours as needed for bladder spasms.   No facility-administered encounter medications on file as of  12/29/2016.          Objective:   Physical Exam  Constitutional: She is oriented to person, place, and time. She appears well-developed and well-nourished.  HENT:  Head: Normocephalic and atraumatic.  Right Ear: External ear normal.  Left Ear: External ear normal.  Nose: Nose normal.  Mouth/Throat: Oropharynx is clear and moist.  TMs and canals are clear.   Eyes: Conjunctivae and EOM are normal. Pupils are equal, round, and reactive to light.  Neck: Neck supple. No thyromegaly present.  Cardiovascular: Normal rate, regular rhythm and normal heart sounds.   Pulmonary/Chest: Effort normal and breath sounds normal. She has no wheezes.  Lymphadenopathy:    She has no cervical adenopathy.  Neurological: She is alert and oriented to person, place, and time.  Skin: Skin is warm and dry.  Psychiatric: She has a normal mood and affect.       Assessment & Plan:  Headache - Unclear etiology. Find it interesting that her oxygen was low at 89% when she first checked this morning and that was where after wearing CPAP and oxygen all night long. But she's not had any chest symptoms such as cough shortness of breath wheezing or chest discomfort. I would like to order an overnight pulse ox just to make sure that she is getting adequate oxygen overnight. Pronounced 100 go home and rest and hydrate really well today. Will get a lab just  to rule out electrolyte abnormalities and anemia. She's not had any chest symptoms whatsoever. If symptoms persist or get worse or she has new symptoms and please call us back.  IFG - fantastic. Hemoglobin A1c down to 5.4 which is a great improvement from previous. Lab Results  Component Value Date   HGBA1C 5.4 12/29/2016    Checked of sleep apnea-we'll call the insurance and see if they can do an overnight pulse oximetry. [facility] with [facility].

## 2017-01-04 ENCOUNTER — Other Ambulatory Visit: Payer: Self-pay

## 2017-01-05 ENCOUNTER — Other Ambulatory Visit: Payer: Self-pay | Admitting: *Deleted

## 2017-01-05 MED ORDER — AMBULATORY NON FORMULARY MEDICATION: 0 refills | Status: AC

## 2017-01-05 NOTE — Progress Notes (Signed)
Order for overnight O2 2L placed and faxed to [facility].Alison Richards, Alison Richards

## 2017-01-16 ENCOUNTER — Telehealth: Payer: Self-pay

## 2017-01-16 NOTE — Telephone Encounter (Signed)
She is already on CPAP and she is already on Oxygen. I just want an overnight oxymetry to make sure that is working properly.

## 2017-01-16 NOTE — Telephone Encounter (Signed)
Alison Richards from [facility] called, You ordered an overnight oxcimetry test.  This will not qualify her for the O2, she will need a titrated sleep study with a CPAP to qualify her.  Do you want her to have both test?  Please advise.

## 2017-01-17 NOTE — Telephone Encounter (Signed)
Left VM for [facility], requested callback.

## 2017-01-18 NOTE — Telephone Encounter (Signed)
Notified Stephanie.

## 2017-03-30 ENCOUNTER — Encounter: Payer: Self-pay | Admitting: Family Medicine

## 2017-03-30 ENCOUNTER — Ambulatory Visit (INDEPENDENT_AMBULATORY_CARE_PROVIDER_SITE_OTHER): Payer: Medicare Other | Admitting: Family Medicine

## 2017-03-30 VITALS — BP 137/67 | HR 95 | Ht 64.27 in | Wt 278.0 lb

## 2017-03-30 DIAGNOSIS — Z01118 Encounter for examination of ears and hearing with other abnormal findings: Secondary | ICD-10-CM

## 2017-03-30 DIAGNOSIS — H938X1 Other specified disorders of right ear: Secondary | ICD-10-CM

## 2017-03-30 MED ORDER — AMOXICILLIN-POT CLAVULANATE 875-125 MG PO TABS: 1.0000 | ORAL_TABLET | Freq: Two times a day (BID) | ORAL | 0 refills | Status: DC

## 2017-03-30 NOTE — Progress Notes (Signed)
Subjective:    Patient ID: Alison Richards, female    DOB: Jun 27, 1951, 66 y.o.   MRN: [id number]  HPI 66 year old female with a history of TIA comes in today complaining that she cannot hear out of her right ear.She says it started about 2 months ago. She says initially it was more of a pain and discomfort. She even noticed a little bit of swelling right front of the ear. She had her son who was in the medical field in the [place] take a look in her ear. At that time he reported to her that it was red and angry looking. Also around that time she started to notice some increasing shortness of breath and wheezing. She found an old prescription for prednisone from last year. She took that which did improve her breathing but also improved her ear. She still continued to have some fullness and some intermittent pressure in that right ear. More recently she is noticing some pressure going into her right facial cheek bone and around her right eye. She still feels like some days it's a little more swollen in front of the ears. No fevers chills or sweats. She's been getting a clear drainage from the ear that has a bad odor. She had her son look at it again and he said he saw something yellow and encouraged her to come in.   Review of Systems  BP 137/67   Pulse 95   Ht 5' 4.27" (1.633 m)   Wt 278 lb (126.1 kg)   SpO2 97%   BMI 47.32 kg/m     Allergies  Allergen Reactions  . Acetaminophen Other (See Comments)    hallucinations, cold sweats, headache, metallic taste, tongue swelling  . Dilaudid [Hydromorphone Hcl] Anaphylaxis and Swelling    Throat closing up  . Hydromorphone Anaphylaxis and Shortness Of Breath    Pt had a tightening in her throat  . Angiotensin Receptor Blockers Other (See Comments)    Joint aches  . Erythromycin Ethylsuccinate Diarrhea    gi symptoms, stomach swelling  . Lisinopril Other (See Comments)    ASTHMA LIKE SYMPTOMS, coughing  . Losartan Potassium-Hctz    REACTION: JOINTS aching, hurting all over  . Morphine Sulfate Nausea And Vomiting    Projectile vomiting  . Valsartan     REACTION: Hurting all over  . Latex Itching and Rash    SENSITIVE TO SOME ADHESIVES.  . Sulfa Antibiotics Swelling and Rash    Eyes swelled shut  . Sulfonamide Derivatives Swelling and Rash  . Tape Rash    Red and bruising, Please use "paper" tape only    Past Medical History:  Diagnosis Date  . BMI 50.0-59.9, adult ([place])   . Degenerative spinal arthritis   . Dependence on nocturnal oxygen therapy    used in addition to cpap  for hypoxmia 2 liters at hs  . GERD (gastroesophageal reflux disease)    PAST HX  H-PYLORI   . H/O hiatal hernia   . History of Helicobacter pylori infection   . History of transient ischemic attack (TIA) 12/31/2015  . Hyperlipemia   . Hypertension   . Left ureteral stone   . Moderate persistent asthma   . Nephrolithiasis    bilateral non-obstructive per ct 05-23-2016  . OA (osteoarthritis)   . Orthostatic lightheadedness   . OSA on CPAP    setting 8  . Pain in joint   . Palpitations   . PONV (postoperative nausea and vomiting)   .  Wears dentures    full top  . Wears glasses     Past Surgical History:  Procedure Laterality Date  . ABDOMINAL HYSTERECTOMY  1999   complete  . ANTERIOR CERVICAL DECOMP/DISCECTOMY FUSION  06/04/2001   C4 -- C6  . BLADDER SURGERY  1995  . CARPAL TUNNEL RELEASE Bilateral left 06-28-2009;  right 11-28-2009  . CESAREAN SECTION  1979   w/ Bilateral Tubal Ligation  . COLONOSCOPY    . CYSTOSCOPY WITH STENT PLACEMENT Left 09/01/2016   Procedure: CYSTOSCOPY WITH STENT PLACEMENT;  Surgeon: Franchot Gallo, MD;  Location: [facility];  Service: Urology;  Laterality: Left;  . CYSTOSCOPY/RETROGRADE/URETEROSCOPY/STONE EXTRACTION WITH BASKET Left 09/01/2016   Procedure: CYSTOSCOPY/RETROGRADE/URETEROSCOPY/STONE EXTRACTION WITH BASKET;  Surgeon: Franchot Gallo, MD;  Location: [facility];  Service: Urology;   Laterality: Left;  . ESOPHAGEAL MANOMETRY  09/02/2012   Procedure: ESOPHAGEAL MANOMETRY (EM);  Surgeon: Sable Feil, MD;  Location: [facility];  Service: Endoscopy;  Laterality: N/A;  . HOLMIUM LASER APPLICATION Left 40/12/4740   Procedure: HOLMIUM LASER APPLICATION;  Surgeon: Franchot Gallo, MD;  Location: [facility];  Service: Urology;  Laterality: Left;  . LAPAROSCOPIC CHOLECYSTECTOMY  01/15/2007  . TONSILLECTOMY  age 14  . TOTAL KNEE ARTHROPLASTY  11/14/2011   Procedure: TOTAL KNEE ARTHROPLASTY;  Surgeon: Mauri Pole, MD;  Location: [facility];  Service: Orthopedics;  Laterality: Right;  combined with general block  . TOTAL KNEE ARTHROPLASTY Left 01/21/2007  . TRIGGER FINGER RELEASE Right 12/25/2013   Procedure: RIGHT LONG TRIGGER RELEASE ;  Surgeon: Tennis Must, MD;  Location: [facility];  Service: Orthopedics;  Laterality: Right;    Social History   Social History  . Marital status: Married    Spouse name: Herbie Baltimore  . Number of children: 3  . Years of education: N/A   Occupational History  . LAB [employer]   Social History Main Topics  . Smoking status: Never Smoker  . Smokeless tobacco: Never Used  . Alcohol use No  . Drug use: No  . Sexual activity: Yes    Partners: Male   Other Topics Concern  . Not on file   Social History Narrative   Daily caffeine     Family History  Problem Relation Age of Onset  . Alzheimer's disease Mother 59  . Hypertension Mother   . Hyperlipidemia Mother   . Diverticulitis Mother   . Alcohol abuse Father   . Cancer Father   . COPD Father   . Lung cancer Father   . Skin cancer Father   . Prostate cancer Father   . Colon cancer Neg Hx     Outpatient Encounter Prescriptions as of 03/30/2017  Medication Sig  . albuterol (PROVENTIL HFA;VENTOLIN HFA) 108 (90 Base) MCG/ACT inhaler Inhale 2 puffs into the lungs every 6 (six) hours as needed for wheezing.  . Aliskiren-Hydrochlorothiazide (TEKTURNA HCT)  300-12.5 MG TABS Take 1 tablet by mouth daily before breakfast.   . AMBULATORY NON FORMULARY MEDICATION Medication Name: Overnight pulse oximetry. Patient is [name] on oxygen and CPAP but she's been waking up with low pulse ox is in the morning and headaches. Just want to confirm that she is actually maintaining her oxygen level overnight.  . AMBULATORY NON FORMULARY MEDICATION Medication Name: Nocturnal oxygen 2 liters.  . hydrochlorothiazide (HYDRODIURIL) 25 MG tablet Take 12.5 mg by mouth daily.  Marland Kitchen loratadine (CLARITIN) 10 MG tablet Take 10 mg by mouth daily as needed for allergies.  Marland Kitchen  naproxen sodium (ANAPROX) 220 MG tablet Take 220-440 mg by mouth 2 (two) times daily as needed (for pain).  Marland Kitchen amoxicillin-clavulanate (AUGMENTIN) 875-125 MG tablet Take 1 tablet by mouth 2 (two) times daily.  . [DISCONTINUED] oxybutynin (DITROPAN) 5 MG tablet Take 1 tablet (5 mg total) by mouth every 8 (eight) hours as needed for bladder spasms.   No facility-administered encounter medications on file as of 03/30/2017.           Objective:   Physical Exam  Constitutional: She appears well-developed and well-nourished.  HENT:  Head: Normocephalic and atraumatic.  Right Ear: External ear normal.  Left Ear: External ear normal.  Ears:  Nose: Nose normal.  Mouth/Throat: Oropharynx is clear and moist.  Left TM and canal is clear. The right tympanic membrane is thickened I'm unable to visualize the ossicle and there significant yellow appearance on the right half of the eardrum. The eardrum looks distended as the light reflexes shifted upward.  Eyes: Conjunctivae and EOM are normal.  Neck: Neck supple.          Assessment & Plan:  Abnormal right ear exam - Worrisome for possible cholesteatoma. Recommend referral to ENT for further evaluation. Because she has had some pressure and some clear discharge with an odor undergo ahead and place her on Augmentin over the weekend do think this might improve her  symptoms slightly back spleen her I do not think at this point to completely resolve the problem. I do think she needs any further workup and maybe even possible imaging. We'll go ahead and place referral. Call if any problems or suddenly getting worse or develops a fever. Tympanometry revealed a low peak height in the left ear as well as the right ear. That she is completely asymptomatic in her left ear.

## 2017-04-25 DIAGNOSIS — H9210 Otorrhea, unspecified ear: Secondary | ICD-10-CM | POA: Diagnosis not present

## 2017-04-25 DIAGNOSIS — H838X1 Other specified diseases of right inner ear: Secondary | ICD-10-CM | POA: Diagnosis not present

## 2017-04-25 DIAGNOSIS — H60391 Other infective otitis externa, right ear: Secondary | ICD-10-CM | POA: Diagnosis not present

## 2017-04-25 DIAGNOSIS — H903 Sensorineural hearing loss, bilateral: Secondary | ICD-10-CM | POA: Diagnosis not present

## 2017-05-07 ENCOUNTER — Encounter: Payer: Self-pay | Admitting: Emergency Medicine

## 2017-05-07 ENCOUNTER — Emergency Department (INDEPENDENT_AMBULATORY_CARE_PROVIDER_SITE_OTHER)
Admission: EM | Admit: 2017-05-07 | Discharge: 2017-05-07 | Disposition: A | Discharge status: Home / Self Care | Payer: Medicare Other | Source: Home / Self Care | Attending: Family Medicine | Admitting: Family Medicine

## 2017-05-07 DIAGNOSIS — J4521 Mild intermittent asthma with (acute) exacerbation: Secondary | ICD-10-CM | POA: Diagnosis not present

## 2017-05-07 MED ORDER — PREDNISONE 20 MG PO TABS: ORAL_TABLET | ORAL | 0 refills | Status: DC

## 2017-05-07 MED ORDER — IPRATROPIUM-ALBUTEROL 0.5-2.5 (3) MG/3ML IN SOLN
3.0000 mL | Freq: Once | RESPIRATORY_TRACT | Status: AC
  Administered 2017-05-07: 3 mL via RESPIRATORY_TRACT

## 2017-05-07 MED ORDER — METHYLPREDNISOLONE SODIUM SUCC 40 MG IJ SOLR
80.0000 mg | Freq: Once | INTRAMUSCULAR | Status: AC
  Administered 2017-05-07: 80 mg via INTRAMUSCULAR

## 2017-05-07 NOTE — ED Provider Notes (Signed)
CSN: [id number]     Arrival date & time 05/07/17  1907 History   First MD Initiated Contact with Patient 05/07/17 1924     Chief Complaint  Patient presents with  . Cough   (Consider location/radiation/quality/duration/timing/severity/associated sxs/prior Treatment) HPI Briley Bumgarner is a 66 y.o. female presenting to UC with c/o gradually worsening dry cough for about 1 week. Hx of asthma and chronic cough but yesterday and today she experienced episodes of SOB where she felt she could not get a good breath in.  She used her albuterol inhaler yesterday, which helped, but she notes it makes her anxious so she did not try it today.  Breathing has gradually improved on its own throughout the day but pt is concerned it may continue to worsen.  Denies fever, chills, n/v/d. She has done well with prednisone in the past.    Past Medical History:  Diagnosis Date  . BMI 50.0-59.9, adult ([place])   . Degenerative spinal arthritis   . Dependence on nocturnal oxygen therapy    used in addition to cpap  for hypoxmia 2 liters at hs  . GERD (gastroesophageal reflux disease)    PAST HX  H-PYLORI   . H/O hiatal hernia   . History of Helicobacter pylori infection   . History of transient ischemic attack (TIA) 12/31/2015  . Hyperlipemia   . Hypertension   . Left ureteral stone   . Moderate persistent asthma   . Nephrolithiasis    bilateral non-obstructive per ct 05-23-2016  . OA (osteoarthritis)   . Orthostatic lightheadedness   . OSA on CPAP    setting 8  . Pain in joint   . Palpitations   . PONV (postoperative nausea and vomiting)   . Wears dentures    full top  . Wears glasses    Past Surgical History:  Procedure Laterality Date  . ABDOMINAL HYSTERECTOMY  1999   complete  . ANTERIOR CERVICAL DECOMP/DISCECTOMY FUSION  06/04/2001   C4 -- C6  . BLADDER SURGERY  1995  . CARPAL TUNNEL RELEASE Bilateral left 06-28-2009;  right 11-28-2009  . CESAREAN SECTION  1979   w/ Bilateral Tubal  Ligation  . COLONOSCOPY    . CYSTOSCOPY WITH STENT PLACEMENT Left 09/01/2016   Procedure: CYSTOSCOPY WITH STENT PLACEMENT;  Surgeon: Franchot Gallo, MD;  [facility];  Service: Urology;  Laterality: Left;  . CYSTOSCOPY/RETROGRADE/URETEROSCOPY/STONE EXTRACTION WITH BASKET Left 09/01/2016   Procedure: CYSTOSCOPY/RETROGRADE/URETEROSCOPY/STONE EXTRACTION WITH BASKET;  Surgeon: Franchot Gallo, MD;  Location[facility];  Service: Urology;  Laterality: Left;  . ESOPHAGEAL MANOMETRY  09/02/2012   Procedure: ESOPHAGEAL MANOMETRY (EM);  Surgeon: Sable Feil, MD;  [facility];  Service: Endoscopy;  Laterality: N/A;  . HOLMIUM LASER APPLICATION Left 10/02/5808   Procedure: HOLMIUM LASER APPLICATION;  Surgeon: Franchot Gallo, MD;  [facility];  Service: Urology;  Laterality: Left;  . LAPAROSCOPIC CHOLECYSTECTOMY  01/15/2007  . TONSILLECTOMY  age 64  . TOTAL KNEE ARTHROPLASTY  11/14/2011   Procedure: TOTAL KNEE ARTHROPLASTY;  Surgeon: Mauri Pole, MD;  Location: [facility];  Service: Orthopedics;  Laterality: Right;  combined with general block  . TOTAL KNEE ARTHROPLASTY Left 01/21/2007  . TRIGGER FINGER RELEASE Right 12/25/2013   Procedure: RIGHT LONG TRIGGER RELEASE ;  Surgeon: Tennis Must, MD;  Location: [facility];  Service: Orthopedics;  Laterality: Right;   Family History  Problem Relation Age of Onset  . Alzheimer's disease Mother 46  . Hypertension Mother   .  Hyperlipidemia Mother   . Diverticulitis Mother   . Alcohol abuse Father   . Cancer Father   . COPD Father   . Lung cancer Father   . Skin cancer Father   . Prostate cancer Father   . Colon cancer Neg Hx    Social History  Substance Use Topics  . Smoking status: Never Smoker  . Smokeless tobacco: Never Used  . Alcohol use No   OB History    No data available     Review of Systems  Constitutional: Negative for chills and fever.  HENT: Positive for congestion. Negative for ear pain,  sore throat, trouble swallowing and voice change.   Respiratory: Positive for cough, chest tightness, shortness of breath and wheezing.   Cardiovascular: Negative for chest pain and palpitations.  Gastrointestinal: Negative for abdominal pain, diarrhea, nausea and vomiting.  Musculoskeletal: Negative for arthralgias, back pain and myalgias.  Skin: Negative for rash.    Allergies  Acetaminophen; Dilaudid [hydromorphone hcl]; Hydromorphone; Angiotensin receptor blockers; Erythromycin ethylsuccinate; Lisinopril; Losartan potassium-hctz; Morphine sulfate; Valsartan; Latex; Sulfa antibiotics; Sulfonamide derivatives; and Tape  Home Medications   Prior to Admission medications   Medication Sig Start Date End Date Taking? Authorizing Provider  albuterol (PROVENTIL HFA;VENTOLIN HFA) 108 (90 Base) MCG/ACT inhaler Inhale 2 puffs into the lungs every 6 (six) hours as needed for wheezing. 10/19/16 10/19/17  Hali Marry, MD  Aliskiren-Hydrochlorothiazide (TEKTURNA HCT) 300-12.5 MG TABS Take 1 tablet by mouth daily before breakfast.     [provider]  AMBULATORY NON FORMULARY MEDICATION Medication Name: Overnight pulse oximetry. Patient is [name] on oxygen and CPAP but she's been waking up with low pulse ox is in the morning and headaches. Just want to confirm that she is actually maintaining her oxygen level overnight. 12/29/16   Hali Marry, MD  AMBULATORY NON FORMULARY MEDICATION Medication Name: Nocturnal oxygen 2 liters. 01/05/17   Hali Marry, MD  amoxicillin-clavulanate (AUGMENTIN) 875-125 MG tablet Take 1 tablet by mouth 2 (two) times daily. 03/30/17   Hali Marry, MD  hydrochlorothiazide (HYDRODIURIL) 25 MG tablet Take 12.5 mg by mouth daily.    [provider]  loratadine (CLARITIN) 10 MG tablet Take 10 mg by mouth daily as needed for allergies.    [provider]  naproxen sodium (ANAPROX) 220 MG tablet Take 220-440 mg by mouth 2 (two)  times daily as needed (for pain).    [provider]  predniSONE (DELTASONE) 20 MG tablet 3 tabs po day one, then 2 po daily x 4 days 05/07/17   Noe Gens, PA-C   Meds Ordered and Administered this Visit   Medications  methylPREDNISolone sodium succinate (SOLU-MEDROL) 40 mg/mL injection 80 mg (80 mg Intramuscular Given 05/07/17 2002)  ipratropium-albuterol (DUONEB) 0.5-2.5 (3) MG/3ML nebulizer solution 3 mL (3 mLs Nebulization Given 05/07/17 2002)    BP (!) 149/76 (BP Location: Left Arm)   Pulse 99   Temp 98.9 F (37.2 C) (Oral)   Ht 5\' 4"  (1.626 m)   Wt 287 lb (130.2 kg)   SpO2 96%   BMI 49.26 kg/m  No data found.   Physical Exam  Constitutional: She is oriented to person, place, and time. She appears well-developed and well-nourished. No distress.  HENT:  Head: Normocephalic and atraumatic.  Mouth/Throat: Oropharynx is clear and moist.  Eyes: EOM are normal.  Neck: Normal range of motion. Neck supple.  Cardiovascular: Normal rate and regular rhythm.   Pulmonary/Chest: Effort normal. No respiratory  distress. She has wheezes ( faint diffuse expiratory). She has no rales.  Musculoskeletal: Normal range of motion.  Neurological: She is alert and oriented to person, place, and time.  Skin: Skin is warm and dry. She is not diaphoretic.  Psychiatric: She has a normal mood and affect. Her behavior is normal.  Nursing note and vitals reviewed.   Urgent Care Course     Procedures (including critical care time)  Labs Review Labs Reviewed - No data to display  Imaging Review No results found.    MDM   1. Mild intermittent asthma with exacerbation    Tx in UC: Solumedrol 80mg  IM and Duoneb Pt notes she feels better after treatment Lungs: CTAB  No evidence of bacterial infection at this time.  Rx: Prednisone F/u with PCP as needed.    Noe Gens, [place] 05/08/17 [phone]

## 2017-05-07 NOTE — ED Triage Notes (Signed)
Dry cough worse x 1 week, wheezing, can't get a deep breath.

## 2017-05-07 NOTE — Discharge Instructions (Signed)
° °  You were given a shot of solumedrol (a steroid) today to help with inflammation in your lungs to help you breath better and to help with your cough.  You have been prescribed 5 days of prednisone, an oral steroid.  You may start this medication tomorrow with breakfast.   ° °

## 2017-05-08 ENCOUNTER — Telehealth: Payer: Self-pay

## 2017-05-08 NOTE — Telephone Encounter (Signed)
Pt reports that she was seen last night in urgent care for SOB.  She stated that she was given a prednisone shot, breathing treatment and Rx for prednisone. She has not yet picked up the Rx. She wants to know do you agree with treatment plan from urgent care before picking up Rx. Please advise. -EH/RMA

## 2017-05-09 NOTE — Telephone Encounter (Signed)
Patient notified

## 2017-05-09 NOTE — Telephone Encounter (Signed)
Yes, based on the notes, I think it is the right treatment.

## 2017-05-22 ENCOUNTER — Encounter: Payer: Self-pay | Admitting: Family Medicine

## 2017-05-22 ENCOUNTER — Ambulatory Visit (INDEPENDENT_AMBULATORY_CARE_PROVIDER_SITE_OTHER): Payer: Medicare Other | Admitting: Family Medicine

## 2017-05-22 VITALS — BP 196/83 | HR 95 | Wt 287.0 lb

## 2017-05-22 DIAGNOSIS — G4453 Primary thunderclap headache: Secondary | ICD-10-CM

## 2017-05-22 DIAGNOSIS — Z881 Allergy status to other antibiotic agents status: Secondary | ICD-10-CM | POA: Diagnosis not present

## 2017-05-22 DIAGNOSIS — Z882 Allergy status to sulfonamides status: Secondary | ICD-10-CM | POA: Diagnosis not present

## 2017-05-22 DIAGNOSIS — R509 Fever, unspecified: Secondary | ICD-10-CM | POA: Diagnosis not present

## 2017-05-22 DIAGNOSIS — R51 Headache: Secondary | ICD-10-CM | POA: Diagnosis not present

## 2017-05-22 DIAGNOSIS — G4730 Sleep apnea, unspecified: Secondary | ICD-10-CM | POA: Diagnosis not present

## 2017-05-22 DIAGNOSIS — I10 Essential (primary) hypertension: Secondary | ICD-10-CM | POA: Diagnosis not present

## 2017-05-22 DIAGNOSIS — E785 Hyperlipidemia, unspecified: Secondary | ICD-10-CM | POA: Diagnosis not present

## 2017-05-22 DIAGNOSIS — Z886 Allergy status to analgesic agent status: Secondary | ICD-10-CM | POA: Diagnosis not present

## 2017-05-22 DIAGNOSIS — Z888 Allergy status to other drugs, medicaments and biological substances status: Secondary | ICD-10-CM | POA: Diagnosis not present

## 2017-05-22 DIAGNOSIS — R110 Nausea: Secondary | ICD-10-CM | POA: Diagnosis not present

## 2017-05-22 DIAGNOSIS — Z885 Allergy status to narcotic agent status: Secondary | ICD-10-CM | POA: Diagnosis not present

## 2017-05-22 DIAGNOSIS — K219 Gastro-esophageal reflux disease without esophagitis: Secondary | ICD-10-CM | POA: Diagnosis not present

## 2017-05-22 DIAGNOSIS — Z79899 Other long term (current) drug therapy: Secondary | ICD-10-CM | POA: Diagnosis not present

## 2017-05-22 NOTE — Progress Notes (Signed)
Alison Richards is a 66 y.o. female who presents to [place]: [place] today for severe headache.   Alison Richards developed a severe headache today "like a thunderclap". She notes neck stiffness and severe pain. She has photophobia. She denies any radiating pain, weakness or numbness. No vomiting to diarrhea. She has a history of migraine headaches. She notes that her last migraine was years ago and her current symptoms are not consistent with previous episodes of migraines. She additionally notes a history of TIA and notes that her current symptoms are not consistent with her TIA. She denies any trouble speech or swallow or muscle dysfunction. She notes her blood pressure is typically well controlled. She has not tried anything for current symptoms.   Past Medical History:  Diagnosis Date  . BMI 50.0-59.9, adult ([place])   . Degenerative spinal arthritis   . Dependence on nocturnal oxygen therapy    used in addition to cpap  for hypoxmia 2 liters at hs  . GERD (gastroesophageal reflux disease)    PAST HX  H-PYLORI   . H/O hiatal hernia   . History of Helicobacter pylori infection   . History of transient ischemic attack (TIA) 12/31/2015  . Hyperlipemia   . Hypertension   . Left ureteral stone   . Moderate persistent asthma   . Nephrolithiasis    bilateral non-obstructive per ct 05-23-2016  . OA (osteoarthritis)   . Orthostatic lightheadedness   . OSA on CPAP    setting 8  . Pain in joint   . Palpitations   . PONV (postoperative nausea and vomiting)   . Wears dentures    full top  . Wears glasses    Past Surgical History:  Procedure Laterality Date  . ABDOMINAL HYSTERECTOMY  1999   complete  . ANTERIOR CERVICAL DECOMP/DISCECTOMY FUSION  06/04/2001   C4 -- C6  . BLADDER SURGERY  1995  . CARPAL TUNNEL RELEASE Bilateral left 06-28-2009;  right 11-28-2009  . CESAREAN  SECTION  1979   w/ Bilateral Tubal Ligation  . COLONOSCOPY    . CYSTOSCOPY WITH STENT PLACEMENT Left 09/01/2016   Procedure: CYSTOSCOPY WITH STENT PLACEMENT;  Surgeon: Franchot Gallo, MD;  [facility];  Service: Urology;  Laterality: Left;  . CYSTOSCOPY/RETROGRADE/URETEROSCOPY/STONE EXTRACTION WITH BASKET Left 09/01/2016   Procedure: CYSTOSCOPY/RETROGRADE/URETEROSCOPY/STONE EXTRACTION WITH BASKET;  Surgeon: Franchot Gallo, MD;  [facility];  Service: Urology;  Laterality: Left;  . ESOPHAGEAL MANOMETRY  09/02/2012   Procedure: ESOPHAGEAL MANOMETRY (EM);  Surgeon: Sable Feil, MD;  [facility];  Service: Endoscopy;  Laterality: N/A;  . HOLMIUM LASER APPLICATION Left 16/06/6788   Procedure: HOLMIUM LASER APPLICATION;  Surgeon: Franchot Gallo, MD;  [facility];  Service: Urology;  Laterality: Left;  . LAPAROSCOPIC CHOLECYSTECTOMY  01/15/2007  . TONSILLECTOMY  age 39  . TOTAL KNEE ARTHROPLASTY  11/14/2011   Procedure: TOTAL KNEE ARTHROPLASTY;  Surgeon: Mauri Pole, MD;  Location: [facility];  Service: Orthopedics;  Laterality: Right;  combined with general block  . TOTAL KNEE ARTHROPLASTY Left 01/21/2007  . TRIGGER FINGER RELEASE Right 12/25/2013   Procedure: RIGHT LONG TRIGGER RELEASE ;  Surgeon: Tennis Must, MD;  Location: [place];  Service: Orthopedics;  Laterality: Right;   Social History  Substance Use Topics  . Smoking status: Never Smoker  . Smokeless tobacco: Never Used  . Alcohol use No   family history includes Alcohol abuse in her  father; Alzheimer's disease (age of onset: 45) in her mother; COPD in her father; Cancer in her father; Diverticulitis in her mother; Hyperlipidemia in her mother; Hypertension in her mother; Lung cancer in her father; Prostate cancer in her father; Skin cancer in her father.  ROS as above:  Medications: Current Outpatient Prescriptions  Medication Sig Dispense Refill  . albuterol (PROVENTIL  HFA;VENTOLIN HFA) 108 (90 Base) MCG/ACT inhaler Inhale 2 puffs into the lungs every 6 (six) hours as needed for wheezing. 1 Inhaler 1  . Aliskiren-Hydrochlorothiazide (TEKTURNA HCT) 300-12.5 MG TABS Take 1 tablet by mouth daily before breakfast.     . AMBULATORY NON FORMULARY MEDICATION Medication Name: Overnight pulse oximetry. Patient is [name] on oxygen and CPAP but she's been waking up with low pulse ox is in the morning and headaches. Just want to confirm that she is actually maintaining her oxygen level overnight. 1 Units 0  . AMBULATORY NON FORMULARY MEDICATION Medication Name: Nocturnal oxygen 2 liters. 1 vial 0  . amoxicillin-clavulanate (AUGMENTIN) 875-125 MG tablet Take 1 tablet by mouth 2 (two) times daily. 20 tablet 0  . hydrochlorothiazide (HYDRODIURIL) 25 MG tablet Take 12.5 mg by mouth daily.    Marland Kitchen loratadine (CLARITIN) 10 MG tablet Take 10 mg by mouth daily as needed for allergies.    . naproxen sodium (ANAPROX) 220 MG tablet Take 220-440 mg by mouth 2 (two) times daily as needed (for pain).    . predniSONE (DELTASONE) 20 MG tablet 3 tabs po day one, then 2 po daily x 4 days 11 tablet 0   No current facility-administered medications for this visit.    Allergies  Allergen Reactions  . Acetaminophen Other (See Comments)    hallucinations, cold sweats, headache, metallic taste, tongue swelling  . Dilaudid [Hydromorphone Hcl] Anaphylaxis and Swelling    Throat closing up  . Hydromorphone Anaphylaxis and Shortness Of Breath    Pt had a tightening in her throat  . Angiotensin Receptor Blockers Other (See Comments)    Joint aches  . Erythromycin Ethylsuccinate Diarrhea    gi symptoms, stomach swelling  . Lisinopril Other (See Comments)    ASTHMA LIKE SYMPTOMS, coughing  . Losartan Potassium-Hctz     REACTION: JOINTS aching, hurting all over  . Morphine Sulfate Nausea And Vomiting    Projectile vomiting  . Valsartan     REACTION: Hurting all over  . Latex Itching and Rash     SENSITIVE TO SOME ADHESIVES.  . Sulfa Antibiotics Swelling and Rash    Eyes swelled shut  . Sulfonamide Derivatives Swelling and Rash  . Tape Rash    Red and bruising, Please use "paper" tape only    Health Maintenance Health Maintenance  Topic Date Due  . Hepatitis C Screening  1951/01/09  . HIV Screening  06/[birth year]/1966  . COLONOSCOPY  10/30/2013  . DEXA SCAN  06/13/2016  . PNA vac Low Risk Adult (1 of 2 - PCV13) 06/18/2016  . MAMMOGRAM  04/06/2017  . INFLUENZA VACCINE  05/30/2017  . TETANUS/TDAP  07/18/2024     Exam:  BP (!) 196/83   Pulse 95   Wt 287 lb (130.2 kg)   SpO2 96%   BMI 49.26 kg/m   Orthostatic VS for the past 24 hrs:  BP- Lying Pulse- Lying BP- Sitting Pulse- Sitting BP- Standing at 0 minutes Pulse- Standing at 0 minutes  05/22/17 1517 172/89 95 (!) 172/91 95 (!) 182/92 95      Gen: In pain appearing but  nontoxic appearing HEENT: EOMI,  MMM Lungs: Normal work of breathing. CTABL Heart: RRR no MRG Abd: NABS, Soft. Nondistended, Nontender Exts: Brisk capillary refill, warm and well perfused.  C-spine: Significantly decreased motion due to pain Neuro: Alert and oriented normal speech and thought process. Pupillary reflexes equal bilaterally. Normal eye motion.    No results found for this or any previous visit (from the past 72 hour(s)). No results found.    Assessment and Plan: 66 y.o. female with severe headache concerning for subarachnoid hemorrhage. Patient describes thunderclap headache. Additionally she has significantly elevated blood pressure. Patient will be transported to the emergency department via private vehicle (friend will drive) for evaluation for concern for subarachnoid hemorrhage versus hypertensive urgency.   Follow-up with PCP Madilyn Fireman, Rene Kocher, MD) following hospitalization or emergency room visit.   No orders of the defined types were placed in this encounter.  No orders of the defined types were placed in this  encounter.    Discussed warning signs or symptoms. Please see discharge instructions. Patient expresses understanding.

## 2017-05-22 NOTE — Patient Instructions (Signed)
Go directly to the ED.  Do not drive yourself.    Subarachnoid Hemorrhage Subarachnoid hemorrhage is bleeding in the area between the brain and the membrane that covers the brain. The bleeding puts more pressure on the brain and stops blood from reaching some areas of the brain. It is very serious. It may cause brain damage, stroke, or death if not treated. You must be treated in the hospital right away. What increases the risk? You may be more likely to have this condition if you:  Smoke.  Have high blood pressure (hypertension).  Drink too much alcohol.  Are a female, especially after menopause.  Have a family history of disease in the blood vessels of the brain.  Have a certain inherited kidney disease or connective tissue disease.  What are the signs or symptoms?  Having a sudden, severe headache.  Feeling sick to your stomach (nauseous) or throwing up (vomiting) combined with other problems.  Suddenly feeling weak.  Losing feeling on your face, arm, or leg, especially on one side of the body.  Suddenly having trouble walking or moving your arms or legs.  Suddenly feeling confused.  Suddenly having a change in mood or personality.  Having trouble talking or understanding.  Having trouble swallowing.  Suddenly having trouble seeing.  Seeing double.  Feeling dizzy.  Losing your balance or coordination.  Having light bother or hurt your eyes.  Having a stiff neck. Follow these instructions at home:  Take all medicines exactly as told by your doctor.  Eat healthy foods if you can swallow. ? Eat foods that are low in salt and cholesterol. ? Eat foods that are low in saturated and trans fat. ? If told, eat soft or pureed foods so that you do not choke. ? If told, take small bites of food so that you do not choke.  Rest as told by your doctor.  Limit your activity as told by your doctor.  Do not smoke.  Limit how much alcohol you drink. ? Men-drink no  more than 2 drinks a day. ? Women who are not pregnant-drink no more than 1 drink a day.  Make changes to your lifestyle as told by your doctor.  Keep track of your blood pressure as told by your doctor.  Keep your home safe so you do not fall. ? Put grab bars in the bedroom and bathroom. ? Raise toilet seats. ? Put a seat in the shower.  Go to therapy sessions as told by your doctor. This may include physical, occupational, and speech therapy.  Use a walker or cane at all times, if told to do so.  Keep all follow-up visits with your doctor and other specialists. Get help right away if:  You have a sudden, severe headache with no known cause.  You are sick to your stomach or throw up, and have another problem.  You have a sudden weakness.  You lose feeling on one side of your body.  You suddenly have trouble walking or moving arms or legs.  You suddenly feel confused.  You have trouble talking or understanding.  You suddenly have trouble seeing.  You lose your balance or your movements are not coordinated.  You have a stiff neck.  You have trouble breathing.  You are partly or totally unaware of what is going on around you. The symptoms above may be a sign of a serious problem that is an emergency. Do not wait to see if the symptoms will go  away. Get medical help right away. Call your local emergency services (911 in [place] Do not drive yourself to the hospital. This information is not intended to replace advice given to you by your health care provider. Make sure you discuss any questions you have with your health care provider. Document Released: 02/10/2013 Document Revised: 03/23/2016 Document Reviewed: 11/29/2012 Elsevier Interactive Patient Education  [employer].

## 2017-05-23 DIAGNOSIS — Z6841 Body Mass Index (BMI) 40.0 and over, adult: Secondary | ICD-10-CM | POA: Diagnosis not present

## 2017-05-23 DIAGNOSIS — I10 Essential (primary) hypertension: Secondary | ICD-10-CM | POA: Diagnosis not present

## 2017-05-23 DIAGNOSIS — R809 Proteinuria, unspecified: Secondary | ICD-10-CM | POA: Diagnosis not present

## 2017-05-23 DIAGNOSIS — N200 Calculus of kidney: Secondary | ICD-10-CM | POA: Diagnosis not present

## 2017-05-25 DIAGNOSIS — N200 Calculus of kidney: Secondary | ICD-10-CM | POA: Diagnosis not present

## 2017-05-28 ENCOUNTER — Emergency Department (HOSPITAL_COMMUNITY): Payer: Medicare Other

## 2017-05-28 ENCOUNTER — Inpatient Hospital Stay (HOSPITAL_COMMUNITY)
Admission: EM | Admit: 2017-05-28 | Discharge: 2017-05-30 | DRG: 309 | Disposition: A | Discharge status: Home / Self Care | Payer: Medicare Other | Attending: Internal Medicine | Admitting: Internal Medicine

## 2017-05-28 ENCOUNTER — Telehealth: Payer: Self-pay | Admitting: *Deleted

## 2017-05-28 ENCOUNTER — Other Ambulatory Visit: Payer: Self-pay

## 2017-05-28 ENCOUNTER — Encounter: Payer: Self-pay | Admitting: *Deleted

## 2017-05-28 ENCOUNTER — Emergency Department (INDEPENDENT_AMBULATORY_CARE_PROVIDER_SITE_OTHER)
Admission: EM | Admit: 2017-05-28 | Discharge: 2017-05-28 | Disposition: A | Discharge status: Other Acute Inpatient Hospital | Payer: Medicare Other | Source: Home / Self Care | Attending: Family Medicine | Admitting: Family Medicine

## 2017-05-28 ENCOUNTER — Encounter (HOSPITAL_COMMUNITY): Payer: Self-pay | Admitting: Emergency Medicine

## 2017-05-28 DIAGNOSIS — R946 Abnormal results of thyroid function studies: Secondary | ICD-10-CM | POA: Diagnosis present

## 2017-05-28 DIAGNOSIS — Z6841 Body Mass Index (BMI) 40.0 and over, adult: Secondary | ICD-10-CM | POA: Diagnosis not present

## 2017-05-28 DIAGNOSIS — E785 Hyperlipidemia, unspecified: Secondary | ICD-10-CM | POA: Diagnosis not present

## 2017-05-28 DIAGNOSIS — Z8349 Family history of other endocrine, nutritional and metabolic diseases: Secondary | ICD-10-CM

## 2017-05-28 DIAGNOSIS — Z8249 Family history of ischemic heart disease and other diseases of the circulatory system: Secondary | ICD-10-CM

## 2017-05-28 DIAGNOSIS — K219 Gastro-esophageal reflux disease without esophagitis: Secondary | ICD-10-CM | POA: Diagnosis present

## 2017-05-28 DIAGNOSIS — Z820 Family history of epilepsy and other diseases of the nervous system: Secondary | ICD-10-CM

## 2017-05-28 DIAGNOSIS — I480 Paroxysmal atrial fibrillation: Secondary | ICD-10-CM | POA: Diagnosis not present

## 2017-05-28 DIAGNOSIS — Z8042 Family history of malignant neoplasm of prostate: Secondary | ICD-10-CM

## 2017-05-28 DIAGNOSIS — R072 Precordial pain: Secondary | ICD-10-CM

## 2017-05-28 DIAGNOSIS — I248 Other forms of acute ischemic heart disease: Secondary | ICD-10-CM | POA: Diagnosis present

## 2017-05-28 DIAGNOSIS — Z825 Family history of asthma and other chronic lower respiratory diseases: Secondary | ICD-10-CM

## 2017-05-28 DIAGNOSIS — R000 Tachycardia, unspecified: Secondary | ICD-10-CM | POA: Diagnosis not present

## 2017-05-28 DIAGNOSIS — R079 Chest pain, unspecified: Secondary | ICD-10-CM | POA: Diagnosis not present

## 2017-05-28 DIAGNOSIS — Z808 Family history of malignant neoplasm of other organs or systems: Secondary | ICD-10-CM

## 2017-05-28 DIAGNOSIS — Z96651 Presence of right artificial knee joint: Secondary | ICD-10-CM | POA: Diagnosis present

## 2017-05-28 DIAGNOSIS — I4892 Unspecified atrial flutter: Secondary | ICD-10-CM | POA: Diagnosis not present

## 2017-05-28 DIAGNOSIS — Z8673 Personal history of transient ischemic attack (TIA), and cerebral infarction without residual deficits: Secondary | ICD-10-CM

## 2017-05-28 DIAGNOSIS — R002 Palpitations: Secondary | ICD-10-CM | POA: Diagnosis not present

## 2017-05-28 DIAGNOSIS — R51 Headache: Secondary | ICD-10-CM | POA: Diagnosis present

## 2017-05-28 DIAGNOSIS — Z801 Family history of malignant neoplasm of trachea, bronchus and lung: Secondary | ICD-10-CM

## 2017-05-28 DIAGNOSIS — R911 Solitary pulmonary nodule: Secondary | ICD-10-CM | POA: Diagnosis present

## 2017-05-28 DIAGNOSIS — I10 Essential (primary) hypertension: Secondary | ICD-10-CM | POA: Diagnosis present

## 2017-05-28 DIAGNOSIS — G4733 Obstructive sleep apnea (adult) (pediatric): Secondary | ICD-10-CM | POA: Diagnosis not present

## 2017-05-28 DIAGNOSIS — Z9981 Dependence on supplemental oxygen: Secondary | ICD-10-CM

## 2017-05-28 DIAGNOSIS — J45909 Unspecified asthma, uncomplicated: Secondary | ICD-10-CM | POA: Diagnosis present

## 2017-05-28 DIAGNOSIS — Z9989 Dependence on other enabling machines and devices: Secondary | ICD-10-CM

## 2017-05-28 LAB — CBC WITH DIFFERENTIAL/PLATELET
BASOS PCT: 0 %
Basophils Absolute: 0 10*3/uL (ref 0.0–0.1)
EOS ABS: 0.4 10*3/uL (ref 0.0–0.7)
Eosinophils Relative: 3 %
HEMATOCRIT: 43.2 % (ref 36.0–46.0)
HEMOGLOBIN: 14.5 g/dL (ref 12.0–15.0)
LYMPHS ABS: 4.5 10*3/uL — AB (ref 0.7–4.0)
Lymphocytes Relative: 31 %
MCH: 30.1 pg (ref 26.0–34.0)
MCHC: 33.6 g/dL (ref 30.0–36.0)
MCV: 89.8 fL (ref 78.0–100.0)
MONOS PCT: 7 %
Monocytes Absolute: 1 10*3/uL (ref 0.1–1.0)
NEUTROS ABS: 8.7 10*3/uL — AB (ref 1.7–7.7)
NEUTROS PCT: 59 %
Platelets: 284 10*3/uL (ref 150–400)
RBC: 4.81 MIL/uL (ref 3.87–5.11)
RDW: 13.3 % (ref 11.5–15.5)
WBC: 14.7 10*3/uL — AB (ref 4.0–10.5)

## 2017-05-28 LAB — BASIC METABOLIC PANEL
Anion gap: 10 (ref 5–15)
BUN: 18 mg/dL (ref 6–20)
CHLORIDE: 104 mmol/L (ref 101–111)
CO2: 28 mmol/L (ref 22–32)
CREATININE: 0.97 mg/dL (ref 0.44–1.00)
Calcium: 9.4 mg/dL (ref 8.9–10.3)
GFR calc non Af Amer: 60 mL/min — ABNORMAL LOW (ref >60)
Glucose, Bld: 94 mg/dL (ref 65–99)
POTASSIUM: 3.7 mmol/L (ref 3.5–5.1)
SODIUM: 142 mmol/L (ref 135–145)

## 2017-05-28 LAB — BRAIN NATRIURETIC PEPTIDE: B NATRIURETIC PEPTIDE 5: 28.5 pg/mL (ref 0.0–100.0)

## 2017-05-28 LAB — TROPONIN I

## 2017-05-28 MED ORDER — ASPIRIN 325 MG PO TABS
325.0000 mg | ORAL_TABLET | Freq: Every day | ORAL | Status: DC
  Administered 2017-05-28 (×2): 325 mg via ORAL

## 2017-05-28 MED ORDER — APIXABAN 5 MG PO TABS
5.0000 mg | ORAL_TABLET | Freq: Two times a day (BID) | ORAL | Status: DC
  Administered 2017-05-28 – 2017-05-29 (×2): 5 mg via ORAL
  Filled 2017-05-28 (×2): qty 1

## 2017-05-28 MED ORDER — DILTIAZEM HCL ER COATED BEADS 120 MG PO CP24
120.0000 mg | ORAL_CAPSULE | Freq: Once | ORAL | Status: AC
  Administered 2017-05-28: 120 mg via ORAL
  Filled 2017-05-28: qty 1

## 2017-05-28 MED ORDER — HYDROCHLOROTHIAZIDE 25 MG PO TABS: 12.5000 mg | ORAL_TABLET | Freq: Every day | ORAL | Status: DC

## 2017-05-28 MED ORDER — VERAPAMIL HCL 2.5 MG/ML IV SOLN
5.0000 mg | Freq: Once | INTRAVENOUS | Status: AC
  Administered 2017-05-28: 5 mg via INTRAVENOUS
  Filled 2017-05-28: qty 2

## 2017-05-28 MED ORDER — ALISKIREN-HYDROCHLOROTHIAZIDE 300-12.5 MG PO TABS: 1.0000 | ORAL_TABLET | Freq: Every day | ORAL | Status: DC

## 2017-05-28 MED ORDER — NITROGLYCERIN 0.4 MG SL SUBL
0.4000 mg | SUBLINGUAL_TABLET | SUBLINGUAL | Status: DC | PRN
  Administered 2017-05-28: 0.4 mg via SUBLINGUAL

## 2017-05-28 MED ORDER — VERAPAMIL HCL 2.5 MG/ML IV SOLN
10.0000 mg | Freq: Once | INTRAVENOUS | Status: AC
  Administered 2017-05-28: 10 mg via INTRAVENOUS

## 2017-05-28 MED ORDER — ALBUTEROL SULFATE (2.5 MG/3ML) 0.083% IN NEBU: 3.0000 mL | INHALATION_SOLUTION | Freq: Four times a day (QID) | RESPIRATORY_TRACT | Status: DC | PRN

## 2017-05-28 MED ORDER — SODIUM CHLORIDE 0.9 % IV BOLUS (SEPSIS)
500.0000 mL | Freq: Once | INTRAVENOUS | Status: AC
  Administered 2017-05-28: 500 mL via INTRAVENOUS

## 2017-05-28 MED ORDER — DILTIAZEM HCL ER COATED BEADS 120 MG PO CP24: 120.0000 mg | ORAL_CAPSULE | Freq: Every day | ORAL | Status: DC

## 2017-05-28 MED ORDER — SODIUM CHLORIDE 0.9 % IV BOLUS (SEPSIS)
1000.0000 mL | Freq: Once | INTRAVENOUS | Status: AC
  Administered 2017-05-28: 1000 mL via INTRAVENOUS

## 2017-05-28 NOTE — ED Notes (Signed)
EMS arrived and pt transported to [facility]. Charna Archer, LPN

## 2017-05-28 NOTE — ED Notes (Signed)
Patient transported to X-ray 

## 2017-05-28 NOTE — ED Notes (Signed)
Attempted to call report

## 2017-05-28 NOTE — ED Triage Notes (Signed)
Per EMS chest pressure today, heart palpitations for last week was at [facility] [place] pt was 148 atrial flutter, pt denies pain currently, 20G R A/C. Oxygen 2L at night home. 324 aspirin,  1 nitro, 132 blood sugar

## 2017-05-28 NOTE — ED Provider Notes (Signed)
[facility]    CSN: [id number] Arrival date & time: 05/28/17  1605     History   Chief Complaint Chief Complaint  Patient presents with  . Chest Pain    HPI Alison Richards is a 66 y.o. female.   Patient complains of onset of pressure in her anterior chest about two hours ago.  She checked her pulse and discovered it to be 154.  She felt shaky and experienced tingling in her shoulders and throat.  She has experienced intermittent "fluttering" in her chest for about a week.  No shortness of breath.   The history is provided by the patient and a relative.  Chest Pain  Pain location:  Substernal area Pain quality: pressure   Pain radiates to:  L shoulder Pain severity:  Mild Onset quality:  Sudden Duration:  2 hours Timing:  Constant Progression:  Unchanged Chronicity:  Recurrent Relieved by:  Nothing Worsened by:  Nothing Ineffective treatments:  None tried Associated symptoms: fatigue, palpitations and weakness   Associated symptoms: no abdominal pain, no altered mental status, no anorexia, no anxiety, no back pain, no claudication, no cough, no diaphoresis, no dizziness, no dysphagia, no fever, no headache, no heartburn, no lower extremity edema, no nausea, no near-syncope, no shortness of breath, no syncope and no vomiting   Risk factors: obesity     Past Medical History:  Diagnosis Date  . BMI 50.0-59.9, adult ([place])   . Degenerative spinal arthritis   . Dependence on nocturnal oxygen therapy    used in addition to cpap  for hypoxmia 2 liters at hs  . GERD (gastroesophageal reflux disease)    PAST HX  H-PYLORI   . H/O hiatal hernia   . History of Helicobacter pylori infection   . History of transient ischemic attack (TIA) 12/31/2015  . Hyperlipemia   . Hypertension   . Left ureteral stone   . Moderate persistent asthma   . Nephrolithiasis    bilateral non-obstructive per ct 05-23-2016  . OA (osteoarthritis)   . Orthostatic lightheadedness    . OSA on CPAP    setting 8  . Pain in joint   . Palpitations   . PONV (postoperative nausea and vomiting)   . Wears dentures    full top  . Wears glasses     Patient Active Problem List   Diagnosis Date Noted  . Carpal tunnel syndrome of left wrist   . Stroke-like symptoms   . TIA (transient ischemic attack) 12/31/2015  . Peripheral neuropathy 12/31/2015  . Acute dyspnea 12/31/2015  . Asthma 12/31/2015  . Morbid obesity with BMI of 50.0-59.9, adult ([place]) 12/31/2015  . Bilateral lower extremity edema   . Left lumbar radiculitis   . Hyperlipidemia   . IFG (impaired fasting glucose) 02/13/2014  . S/P right knee replacement 11/14/2011  . ECZEMA 01/02/2011  . HIP PAIN, LEFT 12/05/2010  . THORACIC STRAIN 04/04/2010  . FOOT PAIN, BILATERAL 04/04/2010  . LEG EDEMA, BILATERAL 10/07/2009  . BACK PAIN, LUMBAR 06/11/2009  . Palpitations 03/16/2009  . PAIN IN JOINT, MULTIPLE SITES 03/08/2009  . OSA on CPAP 01/15/2009  . H/O: depression 08/07/2006  . HYPERTENSION, BENIGN SYSTEMIC 08/07/2006  . Asthma with exacerbation 08/07/2006    Past Surgical History:  Procedure Laterality Date  . ABDOMINAL HYSTERECTOMY  1999   complete  . ANTERIOR CERVICAL DECOMP/DISCECTOMY FUSION  06/04/2001   C4 -- C6  . BLADDER SURGERY  1995  . CARPAL TUNNEL RELEASE Bilateral left 06-28-2009;  right 11-28-2009  . CESAREAN SECTION  1979   w/ Bilateral Tubal Ligation  . COLONOSCOPY    . CYSTOSCOPY WITH STENT PLACEMENT Left 09/01/2016   Procedure: CYSTOSCOPY WITH STENT PLACEMENT;  Surgeon: Franchot Gallo, MD;  [facility];  Service: Urology;  Laterality: Left;  . CYSTOSCOPY/RETROGRADE/URETEROSCOPY/STONE EXTRACTION WITH BASKET Left 09/01/2016   Procedure: CYSTOSCOPY/RETROGRADE/URETEROSCOPY/STONE EXTRACTION WITH BASKET;  Surgeon: Franchot Gallo, MD;  [facility];  Service: Urology;  Laterality: Left;  . ESOPHAGEAL MANOMETRY  09/02/2012   Procedure: ESOPHAGEAL MANOMETRY (EM);  Surgeon: Sable Feil, MD;  [facility];  Service: Endoscopy;  Laterality: N/A;  . HOLMIUM LASER APPLICATION Left 85/01/6269   Procedure: HOLMIUM LASER APPLICATION;  Surgeon: Franchot Gallo, MD;  [facility];  Service: Urology;  Laterality: Left;  . LAPAROSCOPIC CHOLECYSTECTOMY  01/15/2007  . TONSILLECTOMY  age 70  . TOTAL KNEE ARTHROPLASTY  11/14/2011   Procedure: TOTAL KNEE ARTHROPLASTY;  Surgeon: Mauri Pole, MD;  [facility];  Service: Orthopedics;  Laterality: Right;  combined with general block  . TOTAL KNEE ARTHROPLASTY Left 01/21/2007  . TRIGGER FINGER RELEASE Right 12/25/2013   Procedure: RIGHT LONG TRIGGER RELEASE ;  Surgeon: Tennis Must, MD;  Location: [place];  Service: Orthopedics;  Laterality: Right;    OB History    No data available       Home Medications    Prior to Admission medications   Medication Sig Start Date End Date Taking? Authorizing Provider  albuterol (PROVENTIL HFA;VENTOLIN HFA) 108 (90 Base) MCG/ACT inhaler Inhale 2 puffs into the lungs every 6 (six) hours as needed for wheezing. 10/19/16 10/19/17  Hali Marry, MD  Aliskiren-Hydrochlorothiazide (TEKTURNA HCT) 300-12.5 MG TABS Take 1 tablet by mouth daily before breakfast.     [provider]  AMBULATORY NON FORMULARY MEDICATION Medication Name: Overnight pulse oximetry. Patient is Alison Richards on oxygen and CPAP but she's been waking up with low pulse ox is in the morning and headaches. Just want to confirm that she is actually maintaining her oxygen level overnight. 12/29/16   Hali Marry, MD  AMBULATORY NON FORMULARY MEDICATION Medication Name: Nocturnal oxygen 2 liters. 01/05/17   Hali Marry, MD  hydrochlorothiazide (HYDRODIURIL) 25 MG tablet Take 12.5 mg by mouth daily.    [provider]  loratadine (CLARITIN) 10 MG tablet Take 10 mg by mouth daily as needed for allergies.    [provider]  naproxen sodium (ANAPROX) 220 MG  tablet Take 220-440 mg by mouth 2 (two) times daily as needed (for pain).    [provider]    Family History Family History  Problem Relation Age of Onset  . Alzheimer's disease Mother 16  . Hypertension Mother   . Hyperlipidemia Mother   . Diverticulitis Mother   . Alcohol abuse Father   . Cancer Father   . COPD Father   . Lung cancer Father   . Skin cancer Father   . Prostate cancer Father   . Colon cancer Neg Hx     Social History Social History  Substance Use Topics  . Smoking status: Never Smoker  . Smokeless tobacco: Never Used  . Alcohol use No     Allergies   Acetaminophen; Dilaudid [hydromorphone hcl]; Hydromorphone; Angiotensin receptor blockers; Erythromycin ethylsuccinate; Lisinopril; Losartan potassium-hctz; Morphine sulfate; Valsartan; Latex; Sulfa antibiotics; Sulfonamide derivatives; and Tape   Review of Systems Review of Systems  Constitutional: Positive for fatigue. Negative for diaphoresis and fever.  HENT: Negative.  Negative for trouble swallowing.   Eyes: Negative.   Respiratory: Negative for cough and shortness of breath.   Cardiovascular: Positive for chest pain and palpitations. Negative for claudication, syncope and near-syncope.  Gastrointestinal: Negative for abdominal pain, anorexia, heartburn, nausea and vomiting.  Musculoskeletal: Negative for back pain.  Neurological: Positive for weakness. Negative for dizziness and headaches.     Physical Exam Triage Vital Signs ED Triage Vitals  Enc Vitals Group     BP 05/28/17 1618 (!) 148/83     Pulse Rate 05/28/17 1618 (!) 147     Resp 05/28/17 1618 18     Temp 05/28/17 1618 98.5 F (36.9 C)     Temp Source 05/28/17 1618 Oral     SpO2 05/28/17 1618 99 %     Weight --      Height --      Head Circumference --      Peak Flow --      Pain Score 05/28/17 1619 2     Pain Loc --      Pain Edu? --      Excl. in [place]? --    No data found.   Updated Vital Signs BP (!) 147/83  (BP Location: Right Arm)   Pulse (!) 151   Temp 98.5 F (36.9 C) (Oral)   Resp 18   SpO2 99%   Visual Acuity Right Eye Distance:   Left Eye Distance:   Bilateral Distance:    Right Eye Near:   Left Eye Near:    Bilateral Near:     Physical Exam Nursing notes and Vital Signs reviewed. Appearance:  Patient appears stated age and uncomfortable, but in no acute distress.    Eyes:  Pupils are equal, round, and reactive to light and accomodation.  Extraocular movement is intact.  Conjunctivae are not inflamed   Pharynx:  Normal; moist mucous membranes  Neck:  Supple.  No adenopathy Lungs:  Clear to auscultation.  Breath sounds are equal.  Moving air well. Heart:  Regular rate, tachycardia 140 Abdomen:  Nontender without masses or hepatosplenomegaly.  Bowel sounds are present.  No CVA or flank tenderness.  Extremities:  No edema.  Skin:  No rash present.      UC Treatments / Results  Labs (all labs ordered are listed, but only abnormal results are displayed) Labs Reviewed - No data to display  EKG  EKG Interpretation  Rate:  146 BPM PR:   msec QT:  360 msec QTcH:  561 msec QRSD:  86 msec QRS axis:  25 degrees Interpretation:  Atrial flutter; ST elevation       Radiology No results found.  Procedures Procedures (including critical care time)  Medications Ordered in UC Medications  aspirin tablet 325 mg (325 mg Oral Given 05/28/17 1619)  nitroGLYCERIN (NITROSTAT) SL tablet 0.4 mg (0.4 mg Sublingual Given 05/28/17 1619)     Initial Impression / Assessment and Plan / UC Course  I have reviewed the triage vital signs and the nursing notes.  Pertinent labs & imaging results that were available during my care of the patient were reviewed by me and considered in my medical decision making (see chart for details).    Administerd NTG 0.4mg  SL, and aspirin 325mg  PO Transported to [facility] ED via EMS in stable condition.    Final Clinical Impressions(s) /  UC Diagnoses   Final diagnoses:  Atrial flutter, unspecified type (HCC)  Precordial chest pain  New Prescriptions New Prescriptions   No medications on file     Kandra Nicolas, MD 06/05/17 1137

## 2017-05-28 NOTE — ED Triage Notes (Signed)
Pt c/o "fluttering" in her chest x 1 wk. Today she c/o center of chest pressure, tingling in her shoulders and throat, pulse 154 at home and feeling shaky.

## 2017-05-28 NOTE — H&P (Signed)
History and Physical    Alison Richards BTD:[id number] DOB: 06-06-1951 DOA: 05/28/2017  PCP: Hali Marry, MD  Patient coming from: Home.  Chief Complaint: Chest tightness and palpitations.  HPI: Alison Richards is a 66 y.o. female with history of asthma and hypertension presents to the ER with complaints of chest tightness and palpitations. Patient stays over the last 2 weeks patient has been having palpitations off and on. Also has had recent exacerbation of asthma. Today patient's palpitations became more persistent with chest tightness and patient decided to come to the ER.   ED Course: In the ER patient was found to be in a flutter with RVR. IV verapamil was given twice following which patient converted to sinus rhythm. ER physician and discuss with cardiologist on call who advised admission because of the chest tightness. At the time of my exam patient is in sinus tach heart rate 110 bpm and presently chest pain-free. Patient is being admitted for further observation.  Review of Systems: As per HPI, rest all negative.   Past Medical History:  Diagnosis Date  . BMI 50.0-59.9, adult ([place])   . Degenerative spinal arthritis   . Dependence on nocturnal oxygen therapy    used in addition to cpap  for hypoxmia 2 liters at hs  . GERD (gastroesophageal reflux disease)    PAST HX  H-PYLORI   . H/O hiatal hernia   . History of Helicobacter pylori infection   . History of transient ischemic attack (TIA) 12/31/2015  . Hyperlipemia   . Hypertension   . Left ureteral stone   . Moderate persistent asthma   . Nephrolithiasis    bilateral non-obstructive per ct 05-23-2016  . OA (osteoarthritis)   . Orthostatic lightheadedness   . OSA on CPAP    setting 8  . Pain in joint   . Palpitations   . PONV (postoperative nausea and vomiting)   . Wears dentures    full top  . Wears glasses     Past Surgical History:  Procedure Laterality Date  . ABDOMINAL HYSTERECTOMY   1999   complete  . ANTERIOR CERVICAL DECOMP/DISCECTOMY FUSION  06/04/2001   C4 -- C6  . BLADDER SURGERY  1995  . CARPAL TUNNEL RELEASE Bilateral left 06-28-2009;  right 11-28-2009  . CESAREAN SECTION  1979   w/ Bilateral Tubal Ligation  . COLONOSCOPY    . CYSTOSCOPY WITH STENT PLACEMENT Left 09/01/2016   Procedure: CYSTOSCOPY WITH STENT PLACEMENT;  Surgeon: Franchot Gallo, MD;  Location: [facility];  Service: Urology;  Laterality: Left;  . CYSTOSCOPY/RETROGRADE/URETEROSCOPY/STONE EXTRACTION WITH BASKET Left 09/01/2016   Procedure: CYSTOSCOPY/RETROGRADE/URETEROSCOPY/STONE EXTRACTION WITH BASKET;  Surgeon: Franchot Gallo, MD;  Location: [facility];  Service: Urology;  Laterality: Left;  . ESOPHAGEAL MANOMETRY  09/02/2012   Procedure: ESOPHAGEAL MANOMETRY (EM);  Surgeon: Sable Feil, MD;  Location: [facility];  Service: Endoscopy;  Laterality: N/A;  . HOLMIUM LASER APPLICATION Left 08/04/2693   Procedure: HOLMIUM LASER APPLICATION;  Surgeon: Franchot Gallo, MD;  Location: [facility];  Service: Urology;  Laterality: Left;  . LAPAROSCOPIC CHOLECYSTECTOMY  01/15/2007  . TONSILLECTOMY  age 66  . TOTAL KNEE ARTHROPLASTY  11/14/2011   Procedure: TOTAL KNEE ARTHROPLASTY;  Surgeon: Mauri Pole, MD;  Location: [facility];  Service: Orthopedics;  Laterality: Right;  combined with general block  . TOTAL KNEE ARTHROPLASTY Left 01/21/2007  . TRIGGER FINGER RELEASE Right 12/25/2013   Procedure: RIGHT LONG TRIGGER RELEASE ;  Surgeon: Tennis Must, MD;  Location: [place];  Service: Orthopedics;  Laterality: Right;     reports that she has never smoked. She has never used smokeless tobacco. She reports that she does not drink alcohol or use drugs.  Allergies  Allergen Reactions  . Acetaminophen Other (See Comments)    AVOID ANY MEDS WITH ANY TRACE AMOUNTS hallucinations, cold sweats, headache, metallic taste, tongue swelling  . Dilaudid [Hydromorphone Hcl] Anaphylaxis and Swelling     Throat closing up  . Hydromorphone Anaphylaxis and Shortness Of Breath    Pt had a tightening in her throat  . Angiotensin Receptor Blockers Other (See Comments)    Joint aches  . Erythromycin Ethylsuccinate Diarrhea    gi symptoms, stomach swelling  . Lisinopril Other (See Comments)    ASTHMA LIKE SYMPTOMS, coughing  . Losartan Potassium-Hctz     REACTION: JOINTS aching, hurting all over  . Morphine Sulfate Nausea And Vomiting    Projectile vomiting  . Valsartan     REACTION: Hurting all over  . Latex Itching and Rash    SENSITIVE TO SOME ADHESIVES.  . Sulfa Antibiotics Swelling and Rash    Eyes swelled shut  . Sulfonamide Derivatives Swelling and Rash  . Tape Rash    Red and bruising, Please use "paper" tape only    Family History  Problem Relation Age of Onset  . Alzheimer's disease Mother 45  . Hypertension Mother   . Hyperlipidemia Mother   . Diverticulitis Mother   . Alcohol abuse Father   . Cancer Father   . COPD Father   . Lung cancer Father   . Skin cancer Father   . Prostate cancer Father   . Colon cancer Neg Hx     Prior to Admission medications   Medication Sig Start Date End Date Taking? Authorizing Provider  albuterol (PROVENTIL HFA;VENTOLIN HFA) 108 (90 Base) MCG/ACT inhaler Inhale 2 puffs into the lungs every 6 (six) hours as needed for wheezing. 10/19/16 10/19/17 Yes Hali Marry, MD  Aliskiren-Hydrochlorothiazide (TEKTURNA HCT) 300-12.5 MG TABS Take 1 tablet by mouth daily before breakfast.    Yes [provider]  AMBULATORY NON FORMULARY MEDICATION Medication Name: Overnight pulse oximetry. Patient is Alison Richards on oxygen and CPAP but she's been waking up with low pulse ox is in the morning and headaches. Just want to confirm that she is actually maintaining her oxygen level overnight. 12/29/16  Yes Hali Marry, MD  AMBULATORY NON FORMULARY MEDICATION Medication Name: Nocturnal oxygen 2 liters. Patient taking differently: Medication  Name: Nocturnal oxygen 2 liters at bedtime. 01/05/17  Yes Hali Marry, MD  hydrochlorothiazide (HYDRODIURIL) 25 MG tablet Take 12.5 mg by mouth daily.   Yes [provider]  naproxen sodium (ANAPROX) 220 MG tablet Take 220-440 mg by mouth 2 (two) times daily as needed (for pain).   Yes [provider]    Physical Exam: Vitals:   05/28/17 1800 05/28/17 1815 05/28/17 1830 05/28/17 1837  BP: (!) 158/111 (!) 120/94 (!) 142/95 (!) 142/95  Pulse: (!) 145 (!) 147 (!) 150   Resp: (!) 21 20 (!) 21   Temp:      TempSrc:      SpO2: 99% 97% 97%       Constitutional: Moderately built and nourished. Vitals:   05/28/17 1800 05/28/17 1815 05/28/17 1830 05/28/17 1837  BP: (!) 158/111 (!) 120/94 (!) 142/95 (!) 142/95  Pulse: (!) 145 (!) 147 (!) 150   Resp: (!) 21 20 (!)  21   Temp:      TempSrc:      SpO2: 99% 97% 97%    Eyes: Anicteric no pallor. ENMT: No discharge from the ears eyes nose and mouth. Neck: No JVD appreciated no mass felt. Respiratory: No rhonchi or crepitations. Cardiovascular: S1-S2 heard no murmurs appreciated. Abdomen: Soft nontender bowel sounds present. No guarding or rigidity. Musculoskeletal: No edema. No joint effusion. Skin: No rash. Skin appears warm. Neurologic: Alert awake oriented to time place and person. Moves all extremities 5 x 5. Psychiatric: Appears normal. Normal affect.   Labs on Admission: I have personally reviewed following labs and imaging studies  CBC:  Recent Labs Lab 05/28/17 1741  WBC 14.7*  NEUTROABS 8.7*  HGB 14.5  HCT 43.2  MCV 89.8  PLT 509   Basic Metabolic Panel:  Recent Labs Lab 05/28/17 1741  NA 142  K 3.7  CL 104  CO2 28  GLUCOSE 94  BUN 18  CREATININE 0.97  CALCIUM 9.4   GFR: Estimated Creatinine Clearance: 77.5 mL/min (by C-G formula based on SCr of 0.97 mg/dL). Liver Function Tests: No results for input(s): AST, ALT, ALKPHOS, BILITOT, PROT, ALBUMIN in the last 168 hours. No results  for input(s): LIPASE, AMYLASE in the last 168 hours. No results for input(s): AMMONIA in the last 168 hours. Coagulation Profile: No results for input(s): INR, PROTIME in the last 168 hours. Cardiac Enzymes:  Recent Labs Lab 05/28/17 1741  TROPONINI <0.03   BNP (last 3 results) No results for input(s): PROBNP in the last 8760 hours. HbA1C: No results for input(s): HGBA1C in the last 72 hours. CBG: No results for input(s): GLUCAP in the last 168 hours. Lipid Profile: No results for input(s): CHOL, HDL, LDLCALC, TRIG, CHOLHDL, LDLDIRECT in the last 72 hours. Thyroid Function Tests: No results for input(s): TSH, T4TOTAL, FREET4, T3FREE, THYROIDAB in the last 72 hours. Anemia Panel: No results for input(s): VITAMINB12, FOLATE, FERRITIN, TIBC, IRON, RETICCTPCT in the last 72 hours. Urine analysis:    Component Value Date/Time   COLORURINE AMBER (A) 03/07/2016 0205   APPEARANCEUR CLOUDY (A) 03/07/2016 0205   LABSPEC 1.025 03/07/2016 0205   PHURINE 5.5 03/07/2016 0205   GLUCOSEU NEGATIVE 03/07/2016 0205   HGBUR LARGE (A) 03/07/2016 0205   HGBUR small 03/08/2009 1346   BILIRUBINUR SMALL (A) 03/07/2016 0205   KETONESUR NEGATIVE 03/07/2016 0205   PROTEINUR 100 (A) 03/07/2016 0205   UROBILINOGEN 0.2 10/05/2012 0935   NITRITE NEGATIVE 03/07/2016 0205   LEUKOCYTESUR TRACE (A) 03/07/2016 0205   Sepsis Labs: @LABRCNTIP (procalcitonin:4,lacticidven:4) )No results found for this or any previous visit (from the past 240 hour(s)).   Radiological Exams on Admission: Dg Chest 2 View  Result Date: 05/28/2017 CLINICAL DATA:  Chest pressure, palpitations EXAM: CHEST  2 VIEW COMPARISON:  08/29/2016 FINDINGS: Lungs are clear.  No pleural effusion or pneumothorax. The heart is normal in size. Degenerative changes of the visualized thoracolumbar spine. IMPRESSION: Normal chest radiographs. Electronically Signed   By: Julian Hy M.D.   On: 05/28/2017 20:48    EKG: Independently reviewed. A  flutter with RVR. Second EKG shows sinus tachycardia.  Assessment/Plan Principal Problem:   Atrial flutter with rapid ventricular response (HCC) Active Problems:   OSA on CPAP   HYPERTENSION, BENIGN SYSTEMIC   Chest pain   Asthma   Atrial flutter ([place])    1. A flutter with RVR converted back to sinus rhythm after IV verapamil - Cardizem CD 120 mg has been ordered. Closely monitor  in telemetry and cycle cardiac markers check TSH 2-D echo. Patient's chads 2 vasc score is 2. Patient is on Apixaban. 2. Asthma is really not wheezing. 3. Hypertension on ARB and hydrochlorothiazide. Follow blood pressure trends now that patient has been on Cardizem CD. 4. OSA on CPAP.  I have reviewed patient's old charts and labs.   DVT prophylaxis: Apixaban. Code Status: Full code.  Family Communication: Discussed with patient.  Disposition Plan: Home.  Consults called: None.  Admission status: Observation.    Rise Patience MD [facility] Pager [phone].  If 7PM-7AM, please contact night-coverage [email or web address] Password [email or web address]  05/28/2017, 10:11 PM

## 2017-05-28 NOTE — Progress Notes (Signed)
ANTICOAGULATION CONSULT NOTE - Initial Consult  Pharmacy Consult for Apixaban Indication: atrial flutter  Allergies  Allergen Reactions  . Acetaminophen Other (See Comments)    AVOID ANY MEDS WITH ANY TRACE AMOUNTS hallucinations, cold sweats, headache, metallic taste, tongue swelling  . Dilaudid [Hydromorphone Hcl] Anaphylaxis and Swelling    Throat closing up  . Hydromorphone Anaphylaxis and Shortness Of Breath    Pt had a tightening in her throat  . Angiotensin Receptor Blockers Other (See Comments)    Joint aches  . Erythromycin Ethylsuccinate Diarrhea    gi symptoms, stomach swelling  . Lisinopril Other (See Comments)    ASTHMA LIKE SYMPTOMS, coughing  . Losartan Potassium-Hctz     REACTION: JOINTS aching, hurting all over  . Morphine Sulfate Nausea And Vomiting    Projectile vomiting  . Valsartan     REACTION: Hurting all over  . Latex Itching and Rash    SENSITIVE TO SOME ADHESIVES.  . Sulfa Antibiotics Swelling and Rash    Eyes swelled shut  . Sulfonamide Derivatives Swelling and Rash  . Tape Rash    Red and bruising, Please use "paper" tape only    Vital Signs: Temp: 97.9 F (36.6 C) (07/30 1712) Temp Source: Oral (07/30 1712) BP: 142/95 (07/30 1837) Pulse Rate: 150 (07/30 1830)  Labs:  Recent Labs  05/28/17 1741  HGB 14.5  HCT 43.2  PLT 284  CREATININE 0.97  TROPONINI <0.03    Estimated Creatinine Clearance: 77.5 mL/min (by C-G formula based on SCr of 0.97 mg/dL).   Medical History: Past Medical History:  Diagnosis Date  . BMI 50.0-59.9, adult ([place])   . Degenerative spinal arthritis   . Dependence on nocturnal oxygen therapy    used in addition to cpap  for hypoxmia 2 liters at hs  . GERD (gastroesophageal reflux disease)    PAST HX  H-PYLORI   . H/O hiatal hernia   . History of Helicobacter pylori infection   . History of transient ischemic attack (TIA) 12/31/2015  . Hyperlipemia   . Hypertension   . Left ureteral stone   . Moderate  persistent asthma   . Nephrolithiasis    bilateral non-obstructive per ct 05-23-2016  . OA (osteoarthritis)   . Orthostatic lightheadedness   . OSA on CPAP    setting 8  . Pain in joint   . Palpitations   . PONV (postoperative nausea and vomiting)   . Wears dentures    full top  . Wears glasses     Assessment: 65yof to begin apixaban for aflutter. She does not meet any requirements for dose reduction.   Goal of Therapy:  Monitor platelets by anticoagulation protocol: Yes   Plan:  1) Apixaban 5mg  po bid 2) Case management consult ordered for cost 3) Will need education  Deboraha Sprang 05/28/2017,9:06 PM

## 2017-05-28 NOTE — Telephone Encounter (Signed)
Patient states does not feel well. Oxygen has been around 96-98. Her pulse 145. She states when she stands she feels a little light headed and feels very shaky. She states it feels like her heart is skipping a beat. She has a hx of PVC's. I advised patient to go to Urgent Care for evaluation. Patient agreed and voiced understanding

## 2017-05-29 ENCOUNTER — Observation Stay (HOSPITAL_COMMUNITY): Payer: Medicare Other

## 2017-05-29 DIAGNOSIS — Z801 Family history of malignant neoplasm of trachea, bronchus and lung: Secondary | ICD-10-CM | POA: Diagnosis not present

## 2017-05-29 DIAGNOSIS — R748 Abnormal levels of other serum enzymes: Secondary | ICD-10-CM

## 2017-05-29 DIAGNOSIS — K219 Gastro-esophageal reflux disease without esophagitis: Secondary | ICD-10-CM | POA: Diagnosis present

## 2017-05-29 DIAGNOSIS — G4733 Obstructive sleep apnea (adult) (pediatric): Secondary | ICD-10-CM

## 2017-05-29 DIAGNOSIS — Z9981 Dependence on supplemental oxygen: Secondary | ICD-10-CM | POA: Diagnosis not present

## 2017-05-29 DIAGNOSIS — Z8249 Family history of ischemic heart disease and other diseases of the circulatory system: Secondary | ICD-10-CM | POA: Diagnosis not present

## 2017-05-29 DIAGNOSIS — Z6841 Body Mass Index (BMI) 40.0 and over, adult: Secondary | ICD-10-CM | POA: Diagnosis not present

## 2017-05-29 DIAGNOSIS — J45909 Unspecified asthma, uncomplicated: Secondary | ICD-10-CM | POA: Diagnosis present

## 2017-05-29 DIAGNOSIS — I10 Essential (primary) hypertension: Secondary | ICD-10-CM | POA: Diagnosis not present

## 2017-05-29 DIAGNOSIS — R911 Solitary pulmonary nodule: Secondary | ICD-10-CM | POA: Diagnosis present

## 2017-05-29 DIAGNOSIS — R51 Headache: Secondary | ICD-10-CM

## 2017-05-29 DIAGNOSIS — Z8349 Family history of other endocrine, nutritional and metabolic diseases: Secondary | ICD-10-CM | POA: Diagnosis not present

## 2017-05-29 DIAGNOSIS — E782 Mixed hyperlipidemia: Secondary | ICD-10-CM

## 2017-05-29 DIAGNOSIS — Z8042 Family history of malignant neoplasm of prostate: Secondary | ICD-10-CM | POA: Diagnosis not present

## 2017-05-29 DIAGNOSIS — I4892 Unspecified atrial flutter: Secondary | ICD-10-CM | POA: Diagnosis not present

## 2017-05-29 DIAGNOSIS — Z825 Family history of asthma and other chronic lower respiratory diseases: Secondary | ICD-10-CM | POA: Diagnosis not present

## 2017-05-29 DIAGNOSIS — R079 Chest pain, unspecified: Secondary | ICD-10-CM

## 2017-05-29 DIAGNOSIS — Z9989 Dependence on other enabling machines and devices: Secondary | ICD-10-CM | POA: Diagnosis not present

## 2017-05-29 DIAGNOSIS — Z820 Family history of epilepsy and other diseases of the nervous system: Secondary | ICD-10-CM | POA: Diagnosis not present

## 2017-05-29 DIAGNOSIS — I208 Other forms of angina pectoris: Secondary | ICD-10-CM | POA: Diagnosis not present

## 2017-05-29 DIAGNOSIS — R946 Abnormal results of thyroid function studies: Secondary | ICD-10-CM | POA: Diagnosis present

## 2017-05-29 DIAGNOSIS — Z96651 Presence of right artificial knee joint: Secondary | ICD-10-CM | POA: Diagnosis present

## 2017-05-29 DIAGNOSIS — Z8673 Personal history of transient ischemic attack (TIA), and cerebral infarction without residual deficits: Secondary | ICD-10-CM | POA: Diagnosis not present

## 2017-05-29 DIAGNOSIS — Z808 Family history of malignant neoplasm of other organs or systems: Secondary | ICD-10-CM | POA: Diagnosis not present

## 2017-05-29 DIAGNOSIS — E785 Hyperlipidemia, unspecified: Secondary | ICD-10-CM | POA: Diagnosis present

## 2017-05-29 DIAGNOSIS — I248 Other forms of acute ischemic heart disease: Secondary | ICD-10-CM | POA: Diagnosis not present

## 2017-05-29 LAB — TSH: TSH: 7.179 u[IU]/mL — AB (ref 0.350–4.500)

## 2017-05-29 LAB — BASIC METABOLIC PANEL
Anion gap: 8 (ref 5–15)
BUN: 16 mg/dL (ref 6–20)
CALCIUM: 8.5 mg/dL — AB (ref 8.9–10.3)
CO2: 24 mmol/L (ref 22–32)
Chloride: 108 mmol/L (ref 101–111)
Creatinine, Ser: 0.87 mg/dL (ref 0.44–1.00)
GFR calc Af Amer: >60 mL/min (ref >60)
Glucose, Bld: 106 mg/dL — ABNORMAL HIGH (ref 65–99)
POTASSIUM: 3.6 mmol/L (ref 3.5–5.1)
Sodium: 140 mmol/L (ref 135–145)

## 2017-05-29 LAB — TROPONIN I
TROPONIN I: 0.1 ng/mL — AB (ref <0.03)
Troponin I: 0.03 ng/mL (ref <0.03)

## 2017-05-29 LAB — CBC
HEMATOCRIT: 39.2 % (ref 36.0–46.0)
Hemoglobin: 12.7 g/dL (ref 12.0–15.0)
MCH: 29.3 pg (ref 26.0–34.0)
MCHC: 32.4 g/dL (ref 30.0–36.0)
MCV: 90.3 fL (ref 78.0–100.0)
Platelets: 246 10*3/uL (ref 150–400)
RBC: 4.34 MIL/uL (ref 3.87–5.11)
RDW: 13.5 % (ref 11.5–15.5)
WBC: 11.4 10*3/uL — ABNORMAL HIGH (ref 4.0–10.5)

## 2017-05-29 LAB — T4, FREE: FREE T4: 0.89 ng/dL (ref 0.61–1.12)

## 2017-05-29 LAB — HIV ANTIBODY (ROUTINE TESTING W REFLEX): HIV SCREEN 4TH GENERATION: NONREACTIVE

## 2017-05-29 LAB — MAGNESIUM: Magnesium: 1.8 mg/dL (ref 1.7–2.4)

## 2017-05-29 MED ORDER — DILTIAZEM HCL ER COATED BEADS 180 MG PO CP24
180.0000 mg | ORAL_CAPSULE | Freq: Every day | ORAL | Status: DC
  Administered 2017-05-29: 180 mg via ORAL
  Filled 2017-05-29: qty 1

## 2017-05-29 MED ORDER — IOPAMIDOL (ISOVUE-370) INJECTION 76%
INTRAVENOUS | Status: AC
  Filled 2017-05-29: qty 100

## 2017-05-29 MED ORDER — KETOROLAC TROMETHAMINE 15 MG/ML IJ SOLN: 15.0000 mg | Freq: Four times a day (QID) | INTRAMUSCULAR | Status: DC | PRN

## 2017-05-29 MED ORDER — METOPROLOL TARTRATE 5 MG/5ML IV SOLN
INTRAVENOUS | Status: AC
  Filled 2017-05-29: qty 5

## 2017-05-29 MED ORDER — METOPROLOL TARTRATE 5 MG/5ML IV SOLN
5.0000 mg | INTRAVENOUS | Status: DC | PRN
  Administered 2017-05-29: 5 mg via INTRAVENOUS

## 2017-05-29 MED ORDER — NITROGLYCERIN 0.4 MG SL SUBL
SUBLINGUAL_TABLET | SUBLINGUAL | Status: AC
  Filled 2017-05-29: qty 2

## 2017-05-29 MED ORDER — NITROGLYCERIN 0.4 MG SL SUBL
0.8000 mg | SUBLINGUAL_TABLET | Freq: Once | SUBLINGUAL | Status: AC
  Administered 2017-05-29: 0.8 mg via SUBLINGUAL

## 2017-05-29 MED ORDER — IOPAMIDOL (ISOVUE-370) INJECTION 76%
INTRAVENOUS | Status: AC
  Administered 2017-05-29: 100 mL
  Filled 2017-05-29: qty 100

## 2017-05-29 MED ORDER — METOPROLOL TARTRATE 5 MG/5ML IV SOLN
5.0000 mg | INTRAVENOUS | Status: DC | PRN
  Administered 2017-05-29 (×3): 5 mg via INTRAVENOUS

## 2017-05-29 MED ORDER — METOPROLOL TARTRATE 5 MG/5ML IV SOLN
INTRAVENOUS | Status: AC
  Filled 2017-05-29: qty 15

## 2017-05-29 MED ORDER — HYDROCHLOROTHIAZIDE 25 MG PO TABS
25.0000 mg | ORAL_TABLET | Freq: Every day | ORAL | Status: DC
  Administered 2017-05-29 – 2017-05-30 (×2): 25 mg via ORAL
  Filled 2017-05-29 (×2): qty 1

## 2017-05-29 MED ORDER — ALISKIREN FUMARATE 150 MG PO TABS
300.0000 mg | ORAL_TABLET | Freq: Every day | ORAL | Status: DC
  Administered 2017-05-29 – 2017-05-30 (×2): 300 mg via ORAL
  Filled 2017-05-29 (×2): qty 2

## 2017-05-29 NOTE — Progress Notes (Addendum)
Chaplain visited with patient via consult.  Patient would like Advanced Directive completed today.  Chaplain provided education for patient and will move forward with getting it completed.  Advanced Directive complete. Copy placed on chart and in medical records.  Original copy given to patient.   05/29/17 1040  Clinical Encounter Type  Visited With Patient  Visit Type Initial;Spiritual support;Social support

## 2017-05-29 NOTE — Progress Notes (Signed)
Text paged [facility] regarding 2nd Troponin level jumped from less than 0.03 to 0.10, no Cardiologist at this point and patient is asymptomatic and has been in NSR 80-90's since admission to the unit, will continue to monitor.

## 2017-05-29 NOTE — Progress Notes (Signed)
Pt set up on Auto CPAP (Max 20, Min 5) with nasal mask and 2L O2 bled in. Pt tolerating at this time. RT will continue to monitor.

## 2017-05-29 NOTE — Progress Notes (Signed)
Text paged the [facility] regarding patient's need for CPAP orders because she wears hers faithfully at home, will have Respiratory therapist come and bring when order placed in Epic and both patient and Respiratory are aware, no other changes at this time.

## 2017-05-29 NOTE — Discharge Instructions (Addendum)

## 2017-05-29 NOTE — ED Provider Notes (Signed)
[facility] DEPT Provider Note   CSN: [id number] Arrival date & time: 05/28/17  1702     History   Chief Complaint Chief Complaint  Patient presents with  . Atrial Flutter    HPI Alison Richards is a 66 y.o. female.   Chest Pain   This is a new problem. The current episode started 3 to 5 hours ago. The problem occurs constantly. The problem has not changed since onset.The pain is present in the substernal region. The pain is moderate. The quality of the pain is described as heavy and pleuritic. Pertinent negatives include no abdominal pain. She has tried nothing for the symptoms. The treatment provided no relief.    Past Medical History:  Diagnosis Date  . BMI 50.0-59.9, adult ([place])   . Degenerative spinal arthritis   . Dependence on nocturnal oxygen therapy    used in addition to cpap  for hypoxmia 2 liters at hs  . GERD (gastroesophageal reflux disease)    PAST HX  H-PYLORI   . H/O hiatal hernia   . History of Helicobacter pylori infection   . History of transient ischemic attack (TIA) 12/31/2015  . Hyperlipemia   . Hypertension   . Left ureteral stone   . Moderate persistent asthma   . Nephrolithiasis    bilateral non-obstructive per ct 05-23-2016  . OA (osteoarthritis)   . Orthostatic lightheadedness   . OSA on CPAP    setting 8  . Pain in joint   . Palpitations   . PONV (postoperative nausea and vomiting)   . Wears dentures    full top  . Wears glasses     Patient Active Problem List   Diagnosis Date Noted  . Atrial flutter with rapid ventricular response ([place]) 05/28/2017  . Atrial flutter ([place]) 05/28/2017  . Carpal tunnel syndrome of left wrist   . Stroke-like symptoms   . TIA (transient ischemic attack) 12/31/2015  . Peripheral neuropathy 12/31/2015  . Acute dyspnea 12/31/2015  . Asthma 12/31/2015  . Morbid obesity with BMI of 50.0-59.9, adult ([place]) 12/31/2015  . Bilateral lower extremity edema   . Left lumbar radiculitis   .  Hyperlipidemia   . Chest pain 06/18/2015  . IFG (impaired fasting glucose) 02/13/2014  . S/P right knee replacement 11/14/2011  . ECZEMA 01/02/2011  . HIP PAIN, LEFT 12/05/2010  . THORACIC STRAIN 04/04/2010  . FOOT PAIN, BILATERAL 04/04/2010  . LEG EDEMA, BILATERAL 10/07/2009  . BACK PAIN, LUMBAR 06/11/2009  . Palpitations 03/16/2009  . PAIN IN JOINT, MULTIPLE SITES 03/08/2009  . OSA on CPAP 01/15/2009  . H/O: depression 08/07/2006  . HYPERTENSION, BENIGN SYSTEMIC 08/07/2006  . Asthma with exacerbation 08/07/2006    Past Surgical History:  Procedure Laterality Date  . ABDOMINAL HYSTERECTOMY  1999   complete  . ANTERIOR CERVICAL DECOMP/DISCECTOMY FUSION  06/04/2001   C4 -- C6  . BLADDER SURGERY  1995  . CARPAL TUNNEL RELEASE Bilateral left 06-28-2009;  right 11-28-2009  . CESAREAN SECTION  1979   w/ Bilateral Tubal Ligation  . COLONOSCOPY    . CYSTOSCOPY WITH STENT PLACEMENT Left 09/01/2016   Procedure: CYSTOSCOPY WITH STENT PLACEMENT;  Surgeon: Franchot Gallo, MD;  Location: [facility];  Service: Urology;  Laterality: Left;  . CYSTOSCOPY/RETROGRADE/URETEROSCOPY/STONE EXTRACTION WITH BASKET Left 09/01/2016   Procedure: CYSTOSCOPY/RETROGRADE/URETEROSCOPY/STONE EXTRACTION WITH BASKET;  Surgeon: Franchot Gallo, MD;  Location: [facility];  Service: Urology;  Laterality: Left;  . ESOPHAGEAL MANOMETRY  09/02/2012   Procedure: ESOPHAGEAL MANOMETRY (EM);  Surgeon:  Sable Feil, MD;  Location: [facility];  Service: Endoscopy;  Laterality: N/A;  . HOLMIUM LASER APPLICATION Left 50/0/9381   Procedure: HOLMIUM LASER APPLICATION;  Surgeon: Franchot Gallo, MD;  Location: [facility];  Service: Urology;  Laterality: Left;  . LAPAROSCOPIC CHOLECYSTECTOMY  01/15/2007  . TONSILLECTOMY  age 35  . TOTAL KNEE ARTHROPLASTY  11/14/2011   Procedure: TOTAL KNEE ARTHROPLASTY;  Surgeon: Mauri Pole, MD;  Location: [facility];  Service: Orthopedics;  Laterality: Right;  combined with general block  . TOTAL  KNEE ARTHROPLASTY Left 01/21/2007  . TRIGGER FINGER RELEASE Right 12/25/2013   Procedure: RIGHT LONG TRIGGER RELEASE ;  Surgeon: Tennis Must, MD;  Location: [facility];  Service: Orthopedics;  Laterality: Right;    OB History    No data available       Home Medications    Prior to Admission medications   Medication Sig Start Date End Date Taking? Authorizing Provider  albuterol (PROVENTIL HFA;VENTOLIN HFA) 108 (90 Base) MCG/ACT inhaler Inhale 2 puffs into the lungs every 6 (six) hours as needed for wheezing. 10/19/16 10/19/17 Yes Hali Marry, MD  Aliskiren-Hydrochlorothiazide (TEKTURNA HCT) 300-12.5 MG TABS Take 1 tablet by mouth daily before breakfast.    Yes [provider]  AMBULATORY NON FORMULARY MEDICATION Medication Name: Overnight pulse oximetry. Patient is [name] on oxygen and CPAP but she's been waking up with low pulse ox is in the morning and headaches. Just want to confirm that she is actually maintaining her oxygen level overnight. 12/29/16  Yes Hali Marry, MD  AMBULATORY NON FORMULARY MEDICATION Medication Name: Nocturnal oxygen 2 liters. Patient taking differently: Medication Name: Nocturnal oxygen 2 liters at bedtime. 01/05/17  Yes Hali Marry, MD  hydrochlorothiazide (HYDRODIURIL) 25 MG tablet Take 12.5 mg by mouth daily.   Yes [provider]  naproxen sodium (ANAPROX) 220 MG tablet Take 220-440 mg by mouth 2 (two) times daily as needed (for pain).   Yes [provider]    Family History Family History  Problem Relation Age of Onset  . Alzheimer's disease Mother 30  . Hypertension Mother   . Hyperlipidemia Mother   . Diverticulitis Mother   . Alcohol abuse Father   . Cancer Father   . COPD Father   . Lung cancer Father   . Skin cancer Father   . Prostate cancer Father   . Colon cancer Neg Hx     Social History Social History  Substance Use Topics  . Smoking status: Never Smoker  .  Smokeless tobacco: Never Used  . Alcohol use No     Allergies   Acetaminophen; Dilaudid [hydromorphone hcl]; Hydromorphone; Angiotensin receptor blockers; Erythromycin ethylsuccinate; Lisinopril; Losartan potassium-hctz; Morphine sulfate; Valsartan; Latex; Sulfa antibiotics; Sulfonamide derivatives; and Tape   Review of Systems Review of Systems  Cardiovascular: Positive for chest pain.  Gastrointestinal: Negative for abdominal pain.  All other systems reviewed and are negative.    Physical Exam Updated Vital Signs BP 136/82 (BP Location: Left Arm)   Pulse 95   Temp 97.7 F (36.5 C) (Axillary)   Resp (!) 30   SpO2 96%   Physical Exam  Constitutional: She is oriented to person, place, and time. She appears well-developed and well-nourished.  HENT:  Head: Normocephalic and atraumatic.  Eyes: Conjunctivae and EOM are normal.  Neck: Normal range of motion.  Cardiovascular: Regular rhythm.  Tachycardia present.   Pulmonary/Chest: Effort normal and breath sounds normal. No stridor. No respiratory  distress.  Abdominal: Soft. Bowel sounds are normal. She exhibits no distension.  Neurological: She is alert and oriented to person, place, and time.  Nursing note and vitals reviewed.    ED Treatments / Results  Labs (all labs ordered are listed, but only abnormal results are displayed) Labs Reviewed  CBC WITH DIFFERENTIAL/PLATELET - Abnormal; Notable for the following:       Result Value   WBC 14.7 (*)    Neutro Abs 8.7 (*)    Lymphs Abs 4.5 (*)    All other components within normal limits  BASIC METABOLIC PANEL - Abnormal; Notable for the following:    GFR calc non Af Amer 60 (*)    All other components within normal limits  BRAIN NATRIURETIC PEPTIDE  TROPONIN I  TROPONIN I  TROPONIN I  TROPONIN I  TSH  HIV ANTIBODY (ROUTINE TESTING)  BASIC METABOLIC PANEL  CBC    EKG  EKG Interpretation  Date/Time:  Monday May 28 2017 19:20:10 EDT Ventricular Rate:  105 PR  Interval:    QRS Duration: 88 QT Interval:  348 QTC Calculation: 460 R Axis:   -126 Text Interpretation:  Sinus tachycardia Consider left atrial enlargement Right axis deviation Low voltage, precordial leads Borderline T abnormalities, anterior leads Confirmed by Merrily Pew [phone]) on 05/28/2017 7:32:35 PM       Radiology Dg Chest 2 View  Result Date: 05/28/2017 CLINICAL DATA:  Chest pressure, palpitations EXAM: CHEST  2 VIEW COMPARISON:  08/29/2016 FINDINGS: Lungs are clear.  No pleural effusion or pneumothorax. The heart is normal in size. Degenerative changes of the visualized thoracolumbar spine. IMPRESSION: Normal chest radiographs. Electronically Signed   By: Julian Hy M.D.   On: 05/28/2017 20:48    Procedures Procedures (including critical care time)  CRITICAL CARE Performed by: Merrily Pew Total critical care time: 35 minutes Critical care time was exclusive of separately billable procedures and treating other patients. Critical care was necessary to treat or prevent imminent or life-threatening deterioration. Critical care was time spent personally by me on the following activities: development of treatment plan with patient and/or surrogate as well as nursing, discussions with consultants, evaluation of patient's response to treatment, examination of patient, obtaining history from patient or surrogate, ordering and performing treatments and interventions, ordering and review of laboratory studies, ordering and review of radiographic studies, pulse oximetry and re-evaluation of patient's condition.   Medications Ordered in ED Medications  apixaban (ELIQUIS) tablet 5 mg (5 mg Oral Given 05/28/17 2130)  albuterol (PROVENTIL) (2.5 MG/3ML) 0.083% nebulizer solution 3 mL (not administered)  diltiazem (CARDIZEM CD) 24 hr capsule 120 mg (120 mg Oral Not Given 05/29/17 0051)  aliskiren (TEKTURNA) tablet 300 mg (not administered)    And  hydrochlorothiazide (HYDRODIURIL)  tablet 25 mg (not administered)  verapamil (ISOPTIN) injection 5 mg (5 mg Intravenous Given 05/28/17 1758)  sodium chloride 0.9 % bolus 1,000 mL (0 mLs Intravenous Stopped 05/28/17 2003)  verapamil (ISOPTIN) injection 10 mg (10 mg Intravenous Given 05/28/17 1837)  sodium chloride 0.9 % bolus 500 mL (0 mLs Intravenous Stopped 05/28/17 2129)  diltiazem (CARDIZEM CD) 24 hr capsule 120 mg (120 mg Oral Given 05/28/17 2130)     Initial Impression / Assessment and Plan / ED Course  I have reviewed the triage vital signs and the nursing notes.  Pertinent labs & imaging results that were available during my care of the patient were reviewed by me and considered in my medical decision making (see chart for  details).     Atrial flutter is likely cause for her chest pressure. I discussed cardioversion versus medications and she opted for verapamil. After 2 doses her heart rate came down to the low 100s. The plan was to start her on xarelto and discharge her however I discussed it with cardiology and because of her persistent chest pressure for multiple hours that she should be observed in the hospital to rule out ACS. I discussed with the hospitalist who recommended starting xarelto and apixaban so this was done and she was admitted in stable condition.  Final Clinical Impressions(s) / ED Diagnoses   Final diagnoses:  Atrial flutter, paroxysmal ([place])     Shermaine Rivet, Corene Cornea, MD 05/29/17 [phone]

## 2017-05-29 NOTE — Progress Notes (Signed)
PROGRESS NOTE    Alison Richards  WPY:[id number] DOB: September 22, 1951 DOA: 05/28/2017 PCP: Hali Marry, MD   Outpatient Specialists:     Brief Narrative:  Alison Richards is a 66 y.o. female with history of asthma and hypertension presents to the ER with complaints of chest tightness and palpitations. Patient stays over the last 2 weeks patient has been having palpitations off and on. Also has had recent exacerbation of asthma and head ache treated at [facility].  She was found to be a fib with RVR, she converted with IV verapamil.   Assessment & Plan:   Principal Problem:   Atrial flutter with rapid ventricular response (HCC) Active Problems:   OSA on CPAP   HYPERTENSION, BENIGN SYSTEMIC   Chest pain   Asthma   Atrial flutter (HCC)   A flutter with RVR converted back to sinus rhythm after IV verapamil  -Cardizem CD 120 mg -TSH elevated- check free t4 2-D echo.  -Patient's chads 2 vasc score is 2- started eliquis- held for cath in AM -CTA of coronaries ordered   Asthma- stable  Hypertension - ARB and hydrochlorothiazide. Follow blood pressure trends now that patient has been on Cardizem CD.  OSA on CPAP  Headache ?related to a fib? -multiple allergies- PRN Toradol   DVT prophylaxis:  Fully anticoagulated   Code Status: Full Code  Family Communication:   Disposition Plan:     Consultants:   cards   Subjective: C/o headache No nausea  Objective: Vitals:   05/28/17 2330 05/29/17 0021 05/29/17 0145 05/29/17 0624  BP: 138/85 136/82  (!) 115/52  Pulse: 96 95 86 80  Resp: (!) 30  18 19   Temp:  97.7 F (36.5 C)    TempSrc:  Axillary    SpO2: 94% 96% 99% 98%  Weight:  131.2 kg (289 lb 3.2 oz)    Height:  5\' 4"  (1.626 m)      Intake/Output Summary (Last 24 hours) at 05/29/17 1350 Last data filed at 05/29/17 0030  Gross per 24 hour  Intake             1620 ml  Output                0 ml  Net             1620 ml   Filed Weights    05/29/17 0021  Weight: 131.2 kg (289 lb 3.2 oz)    Examination:  General exam: Appears calm and comfortable  Respiratory system: Clear to auscultation. Respiratory effort normal. Cardiovascular system: S1 & S2 heard, RRR. No JVD, murmurs, rubs, gallops or clicks. No pedal edema. Gastrointestinal system: Abdomen is nondistended, soft and nontender. No organomegaly or masses felt. Normal bowel sounds heard. Central nervous system: Alert and oriented. No focal neurological deficits. Extremities: Symmetric 5 x 5 power.     Data Reviewed: I have personally reviewed following labs and imaging studies  CBC:  Recent Labs Lab 05/28/17 1741 05/29/17 0417  WBC 14.7* 11.4*  NEUTROABS 8.7*  --   HGB 14.5 12.7  HCT 43.2 39.2  MCV 89.8 90.3  PLT 284 053   Basic Metabolic Panel:  Recent Labs Lab 05/28/17 1741 05/29/17 0417  NA 142 140  K 3.7 3.6  CL 104 108  CO2 28 24  GLUCOSE 94 106*  BUN 18 16  CREATININE 0.97 0.87  CALCIUM 9.4 8.5*   GFR: Estimated Creatinine Clearance: 86.8 mL/min (by C-G formula based on SCr  of 0.87 mg/dL). Liver Function Tests: No results for input(s): AST, ALT, ALKPHOS, BILITOT, PROT, ALBUMIN in the last 168 hours. No results for input(s): LIPASE, AMYLASE in the last 168 hours. No results for input(s): AMMONIA in the last 168 hours. Coagulation Profile: No results for input(s): INR, PROTIME in the last 168 hours. Cardiac Enzymes:  Recent Labs Lab 05/28/17 1741 05/29/17 0027 05/29/17 0417 05/29/17 0933  TROPONINI <0.03 0.10* <0.03 0.03*   BNP (last 3 results) No results for input(s): PROBNP in the last 8760 hours. HbA1C: No results for input(s): HGBA1C in the last 72 hours. CBG: No results for input(s): GLUCAP in the last 168 hours. Lipid Profile: No results for input(s): CHOL, HDL, LDLCALC, TRIG, CHOLHDL, LDLDIRECT in the last 72 hours. Thyroid Function Tests:  Recent Labs  05/29/17 0027  TSH 7.179*   Anemia Panel: No results  for input(s): VITAMINB12, FOLATE, FERRITIN, TIBC, IRON, RETICCTPCT in the last 72 hours. Urine analysis:    Component Value Date/Time   COLORURINE AMBER (A) 03/07/2016 0205   APPEARANCEUR CLOUDY (A) 03/07/2016 0205   LABSPEC 1.025 03/07/2016 0205   PHURINE 5.5 03/07/2016 0205   GLUCOSEU NEGATIVE 03/07/2016 0205   HGBUR LARGE (A) 03/07/2016 0205   HGBUR small 03/08/2009 1346   BILIRUBINUR SMALL (A) 03/07/2016 0205   KETONESUR NEGATIVE 03/07/2016 0205   PROTEINUR 100 (A) 03/07/2016 0205   UROBILINOGEN 0.2 10/05/2012 0935   NITRITE NEGATIVE 03/07/2016 0205   LEUKOCYTESUR TRACE (A) 03/07/2016 0205     )No results found for this or any previous visit (from the past 240 hour(s)).    Anti-infectives    None       Radiology Studies: Dg Chest 2 View  Result Date: 05/28/2017 CLINICAL DATA:  Chest pressure, palpitations EXAM: CHEST  2 VIEW COMPARISON:  08/29/2016 FINDINGS: Lungs are clear.  No pleural effusion or pneumothorax. The heart is normal in size. Degenerative changes of the visualized thoracolumbar spine. IMPRESSION: Normal chest radiographs. Electronically Signed   By: Julian Hy M.D.   On: 05/28/2017 20:48        Scheduled Meds: . aliskiren  300 mg Oral Daily   And  . hydrochlorothiazide  25 mg Oral Daily  . diltiazem  180 mg Oral QHS   Continuous Infusions:   LOS: 0 days    Time spent: 25 min    [place], DO [facility] Pager [phone]  If 7PM-7AM, please contact night-coverage [email or web address] Password [email or web address] 05/29/2017, 1:50 PM

## 2017-05-29 NOTE — Progress Notes (Signed)
CRITICAL VALUE ALERT  Critical Value: troponin  Date & Time Notied:  05/29/17, 1052  Provider Notified: Dr Eliseo Squires  Orders Received/Actions taken: none

## 2017-05-29 NOTE — Consult Note (Signed)
Cardiology Consult    Patient ID: Alison Richards MRN: [id number], DOB/AGE: January 26, 1951   Admit date: 05/28/2017 Date of Consult: 05/29/2017  Primary Physician: Hali Marry, MD Primary Cardiologist: Dr. Meda Coffee Requesting Provider: Dr. Eliseo Squires  Reason for Consult: chest pain, new onset Aflutter  Patient Profile    Alison Richards has a PMH significant for asthma, OSA on CPAP, GERD, arthritis, and HTN. She presented to the [facility] with c/o chest tightness and palpitations. Upon arrival, she was found to be in Atrial flutter with RVR.   Alison Richards is a 66 y.o. female who is being seen today for the evaluation of chest pain and atrial flutter at the request of Dr. Eliseo Squires.   Past Medical History   Past Medical History:  Diagnosis Date  . BMI 50.0-59.9, adult ([place])   . Degenerative spinal arthritis   . Dependence on nocturnal oxygen therapy    used in addition to cpap  for hypoxmia 2 liters at hs  . GERD (gastroesophageal reflux disease)    PAST HX  H-PYLORI   . H/O hiatal hernia   . History of Helicobacter pylori infection   . History of transient ischemic attack (TIA) 12/31/2015  . Hyperlipemia   . Hypertension   . Left ureteral stone   . Moderate persistent asthma   . Nephrolithiasis    bilateral non-obstructive per ct 05-23-2016  . OA (osteoarthritis)   . Orthostatic lightheadedness   . OSA on CPAP    setting 8  . Pain in joint   . Palpitations   . PONV (postoperative nausea and vomiting)   . Wears dentures    full top  . Wears glasses     Past Surgical History:  Procedure Laterality Date  . ABDOMINAL HYSTERECTOMY  1999   complete  . ANTERIOR CERVICAL DECOMP/DISCECTOMY FUSION  06/04/2001   C4 -- C6  . BLADDER SURGERY  1995  . CARPAL TUNNEL RELEASE Bilateral left 06-28-2009;  right 11-28-2009  . CESAREAN SECTION  1979   w/ Bilateral Tubal Ligation  . COLONOSCOPY    . CYSTOSCOPY WITH STENT PLACEMENT Left 09/01/2016   Procedure: CYSTOSCOPY WITH  STENT PLACEMENT;  Surgeon: Franchot Gallo, MD;  Location: [facility];  Service: Urology;  Laterality: Left;  . CYSTOSCOPY/RETROGRADE/URETEROSCOPY/STONE EXTRACTION WITH BASKET Left 09/01/2016   Procedure: CYSTOSCOPY/RETROGRADE/URETEROSCOPY/STONE EXTRACTION WITH BASKET;  Surgeon: Franchot Gallo, MD;  Location: [facility];  Service: Urology;  Laterality: Left;  . ESOPHAGEAL MANOMETRY  09/02/2012   Procedure: ESOPHAGEAL MANOMETRY (EM);  Surgeon: Sable Feil, MD;  Location: [facility];  Service: Endoscopy;  Laterality: N/A;  . HOLMIUM LASER APPLICATION Left 20/12/5595   Procedure: HOLMIUM LASER APPLICATION;  Surgeon: Franchot Gallo, MD;  Location: [facility];  Service: Urology;  Laterality: Left;  . LAPAROSCOPIC CHOLECYSTECTOMY  01/15/2007  . TONSILLECTOMY  age 3  . TOTAL KNEE ARTHROPLASTY  11/14/2011   Procedure: TOTAL KNEE ARTHROPLASTY;  Surgeon: Mauri Pole, MD;  Location: [facility];  Service: Orthopedics;  Laterality: Right;  combined with general block  . TOTAL KNEE ARTHROPLASTY Left 01/21/2007  . TRIGGER FINGER RELEASE Right 12/25/2013   Procedure: RIGHT LONG TRIGGER RELEASE ;  Surgeon: Tennis Must, MD;  Location: [facility];  Service: Orthopedics;  Laterality: Right;     Allergies  Allergies  Allergen Reactions  . Acetaminophen Other (See Comments)    AVOID ANY MEDS WITH ANY TRACE AMOUNTS hallucinations, cold sweats, headache, metallic taste, tongue swelling  . Dilaudid [Hydromorphone Hcl] Anaphylaxis and  Swelling    Throat closing up  . Hydromorphone Anaphylaxis and Shortness Of Breath    Pt had a tightening in her throat  . Angiotensin Receptor Blockers Other (See Comments)    Joint aches  . Erythromycin Ethylsuccinate Diarrhea    gi symptoms, stomach swelling  . Lisinopril Other (See Comments)    ASTHMA LIKE SYMPTOMS, coughing  . Losartan Potassium-Hctz     REACTION: JOINTS aching, hurting all over  . Morphine Sulfate Nausea And Vomiting    Projectile  vomiting  . Valsartan     REACTION: Hurting all over  . Latex Itching and Rash    SENSITIVE TO SOME ADHESIVES.  . Sulfa Antibiotics Swelling and Rash    Eyes swelled shut  . Sulfonamide Derivatives Swelling and Rash  . Tape Rash    Red and bruising, Please use "paper" tape only    History of Present Illness   Alison Richards was seen by our service during a previous hospitalization for chest pain (05/2015). Nuclear stress test at that time was negative for ischemia. She states that she has largely been chest pain free since that admission until last week. Over the course of last week, she developed chest tightness across her whole chest with tingling sensation that radiated up her neck bilaterally. She wears nocturnal supplemental oxygen, but over the past week has felt short of breath with increasing dyspnea on exertion. This chest tightness was occurring during activity, but yesterday occurred at rest when she was driving.    Alison Richards presented to [facility] with this chest pain/tightness and was found to be in atrial flutter with RVR in the 140s. She was given IV verapamil x 2 and converted to NSR. She was also started on eliquis in the ER. On my interview, she has been chest pain free since converting to NSR. She had a mild elevation of one troponin that may be secondary to RVR. She states she has been fatigued, DOE, chest tightness, and palpitations intermittently over the past week. Risk factors for ACS include obesity, OSA, and HTN.   Inpatient Medications    . aliskiren  300 mg Oral Daily   And  . hydrochlorothiazide  25 mg Oral Daily  . apixaban  5 mg Oral BID  . diltiazem  120 mg Oral QHS     Outpatient Medications    Prior to Admission medications   Medication Sig Start Date End Date Taking? Authorizing Provider  albuterol (PROVENTIL HFA;VENTOLIN HFA) 108 (90 Base) MCG/ACT inhaler Inhale 2 puffs into the lungs every 6 (six) hours as needed for wheezing. 10/19/16 10/19/17 Yes  Hali Marry, MD  Aliskiren-Hydrochlorothiazide (TEKTURNA HCT) 300-12.5 MG TABS Take 1 tablet by mouth daily before breakfast.    Yes [provider]  AMBULATORY NON FORMULARY MEDICATION Medication Name: Overnight pulse oximetry. Patient is Alison Richards on oxygen and CPAP but she's been waking up with low pulse ox is in the morning and headaches. Just want to confirm that she is actually maintaining her oxygen level overnight. 12/29/16  Yes Hali Marry, MD  AMBULATORY NON FORMULARY MEDICATION Medication Name: Nocturnal oxygen 2 liters. Patient taking differently: Medication Name: Nocturnal oxygen 2 liters at bedtime. 01/05/17  Yes Hali Marry, MD  hydrochlorothiazide (HYDRODIURIL) 25 MG tablet Take 12.5 mg by mouth daily.   Yes [provider]  naproxen sodium (ANAPROX) 220 MG tablet Take 220-440 mg by mouth 2 (two) times daily as needed (for pain).   Yes [provider]  Family History     Family History  Problem Relation Age of Onset  . Alzheimer's disease Mother 21  . Hypertension Mother   . Hyperlipidemia Mother   . Diverticulitis Mother   . Alcohol abuse Father   . Cancer Father   . COPD Father   . Lung cancer Father   . Skin cancer Father   . Prostate cancer Father   . Colon cancer Neg Hx     Social History    Social History   Social History  . Marital status: Married    Spouse name: Herbie [place]  . Number of children: 3  . Years of education: N/A   Occupational History  . LAB [employer]   Social History Main Topics  . Smoking status: Never Smoker  . Smokeless tobacco: Never Used  . Alcohol use No  . Drug use: No  . Sexual activity: Yes    Partners: Male   Other Topics Concern  . Not on file   Social History Narrative   Daily caffeine      Review of Systems    General:  No chills, fever, night sweats or weight changes.  Cardiovascular:  + chest pain, + dyspnea on exertion, no edema, orthopnea,  palpitations, paroxysmal nocturnal dyspnea. Dermatological: No rash, lesions/masses Respiratory: No cough, + dyspnea Urologic: No hematuria, dysuria Abdominal:   No nausea, vomiting, diarrhea, bright red blood per rectum, melena, or hematemesis Neurologic:  No visual changes, + wkns and fatigue, no changes in mental status. All other systems reviewed and are otherwise negative except as noted above.  Physical Exam    Blood pressure (!) 115/52, pulse 80, temperature 97.7 F (36.5 C), temperature source Axillary, resp. rate 19, height 5\' 4"  (1.626 m), weight 289 lb 3.2 oz (131.2 kg), SpO2 98 %.  General: Pleasant, NAD Psych: Normal affect. Neuro: Alert and oriented X 3. Moves all extremities spontaneously. HEENT: Normal  Neck: Supple without bruits or JVD. Lungs:  Resp regular and unlabored, CTA. Heart: RRR no s3, s4, or murmurs. Abdomen: Soft, non-tender, non-distended, BS + x 4.  Extremities: No clubbing, cyanosis or edema. DP/PT/Radials 2+ and equal bilaterally.  Labs    Troponin (Point of Care Test) No results for input(s): TROPIPOC in the last 72 hours.  Recent Labs  05/28/17 1741 05/29/17 0027 05/29/17 0417  TROPONINI <0.03 0.10* <0.03   Lab Results  Component Value Date   WBC 11.4 (H) 05/29/2017   HGB 12.7 05/29/2017   HCT 39.2 05/29/2017   MCV 90.3 05/29/2017   PLT 246 05/29/2017    Recent Labs Lab 05/29/17 0417  NA 140  K 3.6  CL 108  CO2 24  BUN 16  CREATININE 0.87  CALCIUM 8.5*  GLUCOSE 106*   Lab Results  Component Value Date   CHOL 178 01/01/2016   HDL 42 01/01/2016   LDLCALC 108 (H) 01/01/2016   TRIG 142 01/01/2016   Lab Results  Component Value Date   DDIMER 0.30 08/29/2016     Radiology Studies    Dg Chest 2 View  Result Date: 05/28/2017 CLINICAL DATA:  Chest pressure, palpitations EXAM: CHEST  2 VIEW COMPARISON:  08/29/2016 FINDINGS: Lungs are clear.  No pleural effusion or pneumothorax. The heart is normal in size. Degenerative  changes of the visualized thoracolumbar spine. IMPRESSION: Normal chest radiographs. Electronically Signed   By: Julian Hy M.D.   On: 05/28/2017 20:48    ECG & Cardiac Imaging    EKG 05/28/17:  sinus tachycardia with ventricular rate 105  EKG 05/28/17: atrial flutter with ventricular rate 147  Echocardiogram 01/02/16: Study Conclusions - Left ventricle: The cavity size was normal. Wall thickness was   normal. Systolic function was normal. The estimated ejection   fraction was in the range of 60% to 65%. Wall motion was normal;   there were no regional wall motion abnormalities.  Impressions: - No cardiac source of emboli was indentified.  Myoview 06/19/15: IMPRESSION: 1. No reversible ischemia or infarction. 2. Normal left ventricular wall motion. 3. Left ventricular ejection fraction 70% 4. Low-risk stress test findings*.  Assessment & Plan    1. Chest pain / tightness / shortness of breath - troponin 0.10 --> 0.03 - EKG without acute signs of ischemia - The patient describes chest pain with both typical and atypical features. Heart catheterization could not be performed today with eliquis administration this morning. Will plan for coronary CT to evaluate calcium and possible coronary blockages. Will continue to hold eliquis.  2. New onset atrial flutter with RVR - IV verapamil converted to NSR - started 180 mg cardizem today - This patients CHA2DS2-VASc Score and unadjusted Ischemic Stroke Rate (% per year) is equal to 3.2 % stroke rate/year from a score of 3 (age, female, HTN) - will resume eliquis after ischemic evaluation is complete  Signed, Ledora Bottcher, PA-C 05/29/2017, 7:48 AM [phone]   The patient was seen, examined and discussed with Minette Brine , PA-C and I agree with the above.   66 year old female with morbid obesity, asthma, OSA on CPAP, GERD, arthritis, and HTN with prior negative ischemic workup by negative nuclear stress test in 05/2015,  LVEF normal at the time.  The patient presented with one week on and off exertional chest pain and shortness of breath, she states that this is new for her several times this last week she would have episodes when she has to stop and sit down. Yesterday in the afternoon she has noticed chest tightness radiating to her neck. She tried to measure her blood pressure with an electronic cuff and noticed that her heart rate was 150. She presented to the ER where she was found to be in atrial flutter with RVR and was given IV verapamil that cardioverted her back to sinus rhythm. She is currently in sinus rhythm and asymptomatic while at rest. The patient received a dose of Eliquis earlier this morning in the ER. Her echocardiogram a year ago showed normal LVEF normal size of left atrium no significant valvular abnormality.  Because of typical exertional symptoms I feel we need to proceed with ischemic workup. Her mild troponin elevation of 0.1 might be attributed to a flutter with RVR. However her symptoms were typical and she has risk factors that include morbid obesity with BMI over 50, hypertension, hyperlipidemia, obstructive sleep apnea. Because she has received Eliquis today we won't be able to proceed with a cath, we will try to obtain coronary CTA despite her high BMI. We will hold Eliquis in case she needs a cath tomorrow, will continue Cardizem CD 180 mg by mouth daily.  Ena Dawley, MD 05/29/2017

## 2017-05-29 NOTE — Care Management Note (Addendum)
Case Management Note  Patient Details  Name: Alison Richards MRN: [id number] Date of Birth: 12-Mar-1951  Subjective/Objective:  Pt presented for Chest tightness and Atrial Fib- converted to NSR. Question Cath vs stress test for 05-30-17.                   Action/Plan: Benefits check completed for Eliquis.  S/W JERRY @ [facility] # [phone]   ELIQUIS 5 MG BID   COVER- YES  CO-PAY- $ 47.00  PRIOR APPROVAL- NO    [facility] : [facility]   Expected Discharge Date:                  Expected Discharge Plan:  Home/Self Care  In-House Referral:  NA  Discharge planning Services  NA  Post Acute Care Choice:  NA Choice offered to:  NA  DME Arranged:  N/A DME Agency:  NA  HH Arranged:  NA HH Agency:  NA  Status of Service:  Completed, signed off  If discussed at [facility] of Stay Meetings, dates discussed:    Additional Comments: 1151 05-30-17 Jacqlyn Krauss, RN,BSN [phone] CM provided pt with Eliquis Card- CM did call [facility] and the medication is not available. CM did call [facility] and medication is available. Pt is aware.  No further needs from CM at this time.  Bethena Roys, RN 05/29/2017, 4:23 PM

## 2017-05-29 NOTE — Progress Notes (Signed)
The patient was seen, examined and discussed with Minette Brine , PA-C and I agree with the above.   66 year old female with morbid obesity, asthma, OSA on CPAP, GERD, arthritis, and HTN with prior negative ischemic workup by negative nuclear stress test in 05/2015, LVEF normal at the time.  The patient presented with one week on and off exertional chest pain and shortness of breath, she states that this is new for her several times this last week she would have episodes when she has to stop and sit down. Yesterday in the afternoon she has noticed chest tightness radiating to her neck. She tried to measure her blood pressure with an electronic cuff and noticed that her heart rate was 150. She presented to the ER where she was found to be in atrial flutter with RVR and was given IV verapamil that cardioverted her back to sinus rhythm. She is currently in sinus rhythm and asymptomatic while at rest. The patient received a dose of Eliquis earlier this morning in the ER. Her echocardiogram a year ago showed normal LVEF normal size of left atrium no significant valvular abnormality.  Because of typical exertional symptoms I feel we need to proceed with ischemic workup. Her mild troponin elevation of 0.1 might be attributed to a flutter with RVR. However her symptoms were typical and she has risk factors that include morbid obesity with BMI over 50, hypertension, hyperlipidemia, obstructive sleep apnea. Because she has received Eliquis today we won't be able to proceed with a cath, we will try to obtain coronary CTA despite her high BMI. We will hold Eliquis in case she needs a cath tomorrow, will continue Cardizem CD 180 mg by mouth daily.  Ena Dawley, MD 05/29/2017

## 2017-05-30 ENCOUNTER — Inpatient Hospital Stay (HOSPITAL_COMMUNITY): Payer: Medicare Other

## 2017-05-30 DIAGNOSIS — I208 Other forms of angina pectoris: Secondary | ICD-10-CM

## 2017-05-30 DIAGNOSIS — I248 Other forms of acute ischemic heart disease: Secondary | ICD-10-CM

## 2017-05-30 LAB — CBC
HEMATOCRIT: 38.7 % (ref 36.0–46.0)
Hemoglobin: 12.5 g/dL (ref 12.0–15.0)
MCH: 29.3 pg (ref 26.0–34.0)
MCHC: 32.3 g/dL (ref 30.0–36.0)
MCV: 90.6 fL (ref 78.0–100.0)
Platelets: 260 10*3/uL (ref 150–400)
RBC: 4.27 MIL/uL (ref 3.87–5.11)
RDW: 13.7 % (ref 11.5–15.5)
WBC: 11.4 10*3/uL — ABNORMAL HIGH (ref 4.0–10.5)

## 2017-05-30 LAB — BASIC METABOLIC PANEL
Anion gap: 9 (ref 5–15)
BUN: 16 mg/dL (ref 6–20)
CALCIUM: 8.5 mg/dL — AB (ref 8.9–10.3)
CO2: 25 mmol/L (ref 22–32)
CREATININE: 0.96 mg/dL (ref 0.44–1.00)
Chloride: 104 mmol/L (ref 101–111)
GFR calc non Af Amer: >60 mL/min (ref >60)
GLUCOSE: 110 mg/dL — AB (ref 65–99)
Potassium: 3.6 mmol/L (ref 3.5–5.1)
Sodium: 138 mmol/L (ref 135–145)

## 2017-05-30 MED ORDER — APIXABAN 5 MG PO TABS
5.0000 mg | ORAL_TABLET | Freq: Two times a day (BID) | ORAL | Status: DC
  Administered 2017-05-30: 5 mg via ORAL
  Filled 2017-05-30: qty 1

## 2017-05-30 MED ORDER — HYDROCHLOROTHIAZIDE 25 MG PO TABS: 25.0000 mg | ORAL_TABLET | Freq: Every day | ORAL | Status: DC

## 2017-05-30 MED ORDER — DILTIAZEM HCL ER COATED BEADS 180 MG PO CP24: 180.0000 mg | ORAL_CAPSULE | Freq: Every day | ORAL | 0 refills | Status: DC

## 2017-05-30 MED ORDER — ALISKIREN FUMARATE 150 MG PO TABS: 150.0000 mg | ORAL_TABLET | Freq: Every day | ORAL | Status: DC

## 2017-05-30 MED ORDER — ALISKIREN-HYDROCHLOROTHIAZIDE 150-25 MG PO TABS: 1.0000 | ORAL_TABLET | Freq: Every day | ORAL | 0 refills | Status: DC

## 2017-05-30 MED ORDER — APIXABAN 5 MG PO TABS: 5.0000 mg | ORAL_TABLET | Freq: Two times a day (BID) | ORAL | 0 refills | Status: DC

## 2017-05-30 NOTE — Discharge Summary (Signed)
Physician Discharge Summary  Alison Richards WVP:[id number] DOB: [birth year] DOA: 05/28/2017  PCP: Hali Marry, MD  Admit date: 05/28/2017 Discharge date: 05/30/2017   Recommendations for Outpatient Follow-Up:   1. TSH elevated but normal free t4-- follow up outpatinet   Discharge Diagnosis:   Principal Problem:   Atrial flutter with rapid ventricular response (HCC) Active Problems:   OSA on CPAP   HYPERTENSION, BENIGN SYSTEMIC   Chest pain   Asthma   Atrial flutter [facility])   Discharge disposition:  Home  Discharge Condition: Improved.  Diet recommendation: Low sodium, heart healthy  Wound care: None.   History of Present Illness:   Alison Richards is a 66 y.o. female with history of asthma and hypertension presents to the ER with complaints of chest tightness and palpitations. Patient stays over the last 2 weeks patient has been having palpitations off and on. Also has had recent exacerbation of asthma. Today patient's palpitations became more persistent with chest tightness and patient decided to come to the ER.    Hospital Course by Problem:   Chest pain/ tightness/ shortness of breath: -- Troponin 0.10 --> 0.03, EKG without signs of ischemia.  -- Patient had a negative coronary CT angio done showing no obvious signs of CAD. In the setting of atrial flutter with RVR her symptoms and elevated troponin are likely due to demand ischemia. No further plans for ischemic evaluation at this time.   New onset atrial flutter with RVR: -- given IV verapamil converted to NSR. Continues to be in sinus rhythm. -- Has been converted to Cardizem CD 180 mg daily at bedtime -- This patients CHA2DS2-VASc Score and unadjusted Ischemic Stroke Rate (% per year) is equal to 3.2 % stroke rate/year from a score of 3(age, female, HTN) -- Eliquis  Elevated TSH -free t4 normal -outpatient follow up  OSA -continue CPAP at home   Medical Consultants:     cards   Discharge Exam:   Vitals:   05/29/17 1947 05/30/17 0628  BP: (!) 101/57 118/73  Pulse: 76 76  Resp: 18 17  Temp: 99.6 F (37.6 C)    Vitals:   05/29/17 1607 05/29/17 1612 05/29/17 1947 05/30/17 0628  BP:   (!) 101/57 118/73  Pulse: 77 72 76 76  Resp: 18 18 18 17   Temp:   99.6 F (37.6 C)   TempSrc:   Oral   SpO2:   98% 99%  Weight:    131.5 kg (289 lb 14.4 oz)  Height:        Gen:  NAD    The results of significant diagnostics from this hospitalization (including imaging, microbiology, ancillary and laboratory) are listed below for reference.     Procedures and Diagnostic Studies:   Dg Chest 2 View  Result Date: 05/28/2017 CLINICAL DATA:  Chest pressure, palpitations EXAM: CHEST  2 VIEW COMPARISON:  08/29/2016 FINDINGS: Lungs are clear.  No pleural effusion or pneumothorax. The heart is normal in size. Degenerative changes of the visualized thoracolumbar spine. IMPRESSION: Normal chest radiographs. Electronically Signed   By: Julian Hy M.D.   On: 05/28/2017 20:48     Labs:   Basic Metabolic Panel:  Recent Labs Lab 05/28/17 1741 05/29/17 0417 05/29/17 1432 05/30/17 0245  NA 142 140  --  138  K 3.7 3.6  --  3.6  CL 104 108  --  104  CO2 28 24  --  25  GLUCOSE 94 106*  --  110*  BUN 18 16  --  16  CREATININE 0.97 0.87  --  0.96  CALCIUM 9.4 8.5*  --  8.5*  MG  --   --  1.8  --    GFR Estimated Creatinine Clearance: 78.8 mL/min (by C-G formula based on SCr of 0.96 mg/dL). Liver Function Tests: No results for input(s): AST, ALT, ALKPHOS, BILITOT, PROT, ALBUMIN in the last 168 hours. No results for input(s): LIPASE, AMYLASE in the last 168 hours. No results for input(s): AMMONIA in the last 168 hours. Coagulation profile No results for input(s): INR, PROTIME in the last 168 hours.  CBC:  Recent Labs Lab 05/28/17 1741 05/29/17 0417 05/30/17 0245  WBC 14.7* 11.4* 11.4*  NEUTROABS 8.7*  --   --   HGB 14.5 12.7 12.5  HCT 43.2  39.2 38.7  MCV 89.8 90.3 90.6  PLT 284 246 260   Cardiac Enzymes:  Recent Labs Lab 05/28/17 1741 05/29/17 0027 05/29/17 0417 05/29/17 0933  TROPONINI <0.03 0.10* <0.03 0.03*   BNP: Invalid input(s): POCBNP CBG: No results for input(s): GLUCAP in the last 168 hours. D-Dimer No results for input(s): DDIMER in the last 72 hours. Hgb A1c No results for input(s): HGBA1C in the last 72 hours. Lipid Profile No results for input(s): CHOL, HDL, LDLCALC, TRIG, CHOLHDL, LDLDIRECT in the last 72 hours. Thyroid function studies  Recent Labs  05/29/17 0027  TSH 7.179*   Anemia work up No results for input(s): VITAMINB12, FOLATE, FERRITIN, TIBC, IRON, RETICCTPCT in the last 72 hours. Microbiology No results found for this or any previous visit (from the past 240 hour(s)).   Discharge Instructions:   Discharge Instructions    Diet - low sodium heart healthy    Complete by:  As directed    Discharge instructions    Complete by:  As directed    Resume CPAP   Increase activity slowly    Complete by:  As directed      Allergies as of 05/30/2017      Reactions   Acetaminophen Other (See Comments)   AVOID ANY MEDS WITH ANY TRACE AMOUNTS hallucinations, cold sweats, headache, metallic taste, tongue swelling   Dilaudid [hydromorphone Hcl] Anaphylaxis, Swelling   Throat closing up   Hydromorphone Anaphylaxis, Shortness Of Breath   Pt had a tightening in her throat   Angiotensin Receptor Blockers Other (See Comments)   Joint aches   Erythromycin Ethylsuccinate Diarrhea   gi symptoms, stomach swelling   Lisinopril Other (See Comments)   ASTHMA LIKE SYMPTOMS, coughing   Losartan Potassium-hctz    REACTION: JOINTS aching, hurting all over   Morphine Sulfate Nausea And Vomiting   Projectile vomiting   Valsartan    REACTION: Hurting all over   Latex Itching, Rash   SENSITIVE TO SOME ADHESIVES.   Sulfa Antibiotics Swelling, Rash   Eyes swelled shut   Sulfonamide Derivatives  Swelling, Rash   Tape Rash   Red and bruising, Please use "paper" tape only      Medication List    STOP taking these medications   hydrochlorothiazide 25 MG tablet Commonly known as:  HYDRODIURIL   TEKTURNA HCT 300-12.5 MG Tabs Generic drug:  Aliskiren-Hydrochlorothiazide Replaced by:  Aliskiren-Hydrochlorothiazide 150-25 MG Tabs     TAKE these medications   albuterol 108 (90 Base) MCG/ACT inhaler Commonly known as:  PROVENTIL HFA;VENTOLIN HFA Inhale 2 puffs into the lungs every 6 (six) hours as needed for wheezing.   Aliskiren-Hydrochlorothiazide 150-25 MG Tabs Commonly known as:  TEKTURNA HCT Take 1 Dose by mouth daily. Replaces:  TEKTURNA HCT 300-12.5 MG Tabs   AMBULATORY NON FORMULARY MEDICATION Medication Name: Overnight pulse oximetry. Patient is [name] on oxygen and CPAP but she's been waking up with low pulse ox is in the morning and headaches. Just want to confirm that she is actually maintaining her oxygen level overnight. What changed:  Another medication with the same name was changed. Make sure you understand how and when to take each.   AMBULATORY NON FORMULARY MEDICATION Medication Name: Nocturnal oxygen 2 liters. What changed:  additional instructions   apixaban 5 MG Tabs tablet Commonly known as:  ELIQUIS Take 1 tablet (5 mg total) by mouth 2 (two) times daily.   diltiazem 180 MG 24 hr capsule Commonly known as:  CARDIZEM CD Take 1 capsule (180 mg total) by mouth at bedtime.   naproxen sodium 220 MG tablet Commonly known as:  ANAPROX Take 220-440 mg by mouth 2 (two) times daily as needed (for pain).      Follow-up Information    Baldwin Jamaica, PA-C Follow up on 06/08/2017.   Specialty:  Cardiology Why:  Your appointment is at 10am, please arrive 15 minutes early. Contact information: [address] [place] [address] [phone]        Hali Marry, MD Follow up in 1 week(s).   Specialty:  Family Medicine Contact  information: [address] [place]  [place] [address] [phone]            Time coordinating discharge: 35 min  Signed:  Man   [facility] 05/30/2017, 1:11 PM

## 2017-05-30 NOTE — Progress Notes (Signed)
Progress Note  Patient Name: Alison Richards Date of Encounter: 05/30/2017  Primary Cardiologist: Meda Coffee  Subjective   66 year old female with morbid obesity,asthma, OSA on CPAP, GERD, arthritis, and HTN with prior negative ischemic workup by negative nuclear stress test in 05/2015, LVEF normal at the time.  Had a coronary CTA done which revealed a 4 mm right middle lobe pulmonary nodule. Coronary calcium score of 0. Normal coronary origin with right dominance. No obvious signs of CAD. In sinus rhythm and rate controlled.  Blood pressure 118/73, pulse 76, temperature 99.6 F (37.6 C), temperature source Oral, resp. rate 17, height 5\' 4"  (1.626 m), weight 289 lb 14.4 oz (131.5 kg), SpO2 99 %.   Inpatient Medications    Scheduled Meds: . aliskiren  300 mg Oral Daily   And  . hydrochlorothiazide  25 mg Oral Daily  . diltiazem  180 mg Oral QHS   Continuous Infusions:  PRN Meds: albuterol, ketorolac, metoprolol tartrate   Vital Signs    Vitals:   05/29/17 1607 05/29/17 1612 05/29/17 1947 05/30/17 0628  BP:   (!) 101/57 118/73  Pulse: 77 72 76 76  Resp: 18 18 18 17   Temp:   99.6 F (37.6 C)   TempSrc:   Oral   SpO2:   98% 99%  Weight:    289 lb 14.4 oz (131.5 kg)  Height:        Intake/Output Summary (Last 24 hours) at 05/30/17 0950 Last data filed at 05/30/17 0541  Gross per 24 hour  Intake              480 ml  Output                0 ml  Net              480 ml   Filed Weights   05/29/17 0021 05/30/17 0628  Weight: 289 lb 3.2 oz (131.2 kg) 289 lb 14.4 oz (131.5 kg)    Telemetry    Rate controlled sinus rhythm - Personally Reviewed   Physical Exam   GEN: Well nourished, well developed HEENT: normal  Neck: no JVD, carotid bruits, or masses Cardiac: RRR. no murmurs, rubs, or gallops,no edema. Intact distal pulses bilaterally.  Respiratory: clear to auscultation bilaterally, normal work of breathing GI: soft, nontender, nondistended, + BS MS:  no deformity or atrophy  Skin: warm and dry, no rash Neuro: Alert and Oriented x 3, Strength and sensation are intact Psych:   Full affect  Labs    Chemistry  Recent Labs Lab 05/28/17 1741 05/29/17 0417 05/30/17 0245  NA 142 140 138  K 3.7 3.6 3.6  CL 104 108 104  CO2 28 24 25   GLUCOSE 94 106* 110*  BUN 18 16 16   CREATININE 0.97 0.87 0.96  CALCIUM 9.4 8.5* 8.5*  GFRNONAA 60* >60 >60  GFRAA >60 >60 >60  ANIONGAP 10 8 9      Hematology  Recent Labs Lab 05/28/17 1741 05/29/17 0417 05/30/17 0245  WBC 14.7* 11.4* 11.4*  RBC 4.81 4.34 4.27  HGB 14.5 12.7 12.5  HCT 43.2 39.2 38.7  MCV 89.8 90.3 90.6  MCH 30.1 29.3 29.3  MCHC 33.6 32.4 32.3  RDW 13.3 13.5 13.7  PLT 284 246 260    Cardiac Enzymes  Recent Labs Lab 05/28/17 1741 05/29/17 0027 05/29/17 0417 05/29/17 0933  TROPONINI <0.03 0.10* <0.03 0.03*   No results for input(s): TROPIPOC in the last 168 hours.  BNP  Recent Labs Lab 05/28/17 1741  BNP 28.5     DDimer No results for input(s): DDIMER in the last 168 hours.   Radiology    Dg Chest 2 View  Result Date: 05/28/2017 CLINICAL DATA:  Chest pressure, palpitations EXAM: CHEST  2 VIEW COMPARISON:  08/29/2016 FINDINGS: Lungs are clear.  No pleural effusion or pneumothorax. The heart is normal in size. Degenerative changes of the visualized thoracolumbar spine. IMPRESSION: Normal chest radiographs. Electronically Signed   By: Julian Hy M.D.   On: 05/28/2017 20:48   Ct Coronary Morph W/cta Cor W/score W/ca W/cm &/or Wo/cm  Addendum Date: 05/30/2017   ADDENDUM REPORT: 05/30/2017 08:15 EXAM: OVER-READ INTERPRETATION  CT CHEST The following report is an over-read performed by radiologist Dr. Rebekah Chesterfield [facility] on 05/30/2017. This over-read does not include interpretation of cardiac or coronary anatomy or pathology. The coronary calcium score/cardiac CTA interpretation by the cardiologist is attached. COMPARISON:  Chest CT  12/31/2015. FINDINGS: 3 x 5 mm (mean diameter 4 mm) nodule in the right middle lobe (axial image 19 of series 12). Within the visualized portions of the thorax there are no larger more suspicious appearing pulmonary nodules or masses, there is no acute consolidative airspace disease, no pleural effusions, no pneumothorax and no lymphadenopathy. Visualized portions of the upper abdomen are unremarkable. There are no aggressive appearing lytic or blastic lesions noted in the visualized portions of the skeleton. IMPRESSION: 1. 4 mm right middle lobe pulmonary nodule. This is stable compared to prior study 12/31/2015, considered definitively benign. Electronically Signed   By: Vinnie Langton M.D.   On: 05/30/2017 08:15   Result Date: 05/30/2017 CLINICAL DATA:  61 -year-old female with chest pain. EXAM: Cardiac/Coronary  CT TECHNIQUE: The patient was scanned on a [employer]. FINDINGS: A 120 kV prospective scan was triggered in the descending thoracic aorta at 111 HU's. Axial non-contrast 3 mm slices were carried out through the heart. The data set was analyzed on a dedicated work station and scored using the [place]. 20 mg of iv Metoprolol and 0.8 mg of sl NTG was given. The 3D data set was reconstructed in 5% intervals of the 67-82 % of the R-R cycle. Diastolic phases were analyzed on a dedicated work station using MPR, MIP and VRT modes. The patient received 80 cc of contrast. Aorta:  Normal size.  Trivial calcifications.  No dissection. Aortic Valve:  Trileaflet.  No calcifications. Coronary Arteries:  Normal coronary origin.  Right dominance. RCA is a large dominant artery that gives rise to PDA and PLVB. There is no plaque. Left main is a large artery that gives rise to LAD, ramus intermedius and LCX arteries. LAD is a large vessel that gives rise to one diagonal branch and has no plaque. RI is a very small artery that has no plaque. LCX is a small non-dominant artery that gives rise to one OM1  branch. There is no plaque. Other findings: Normal pulmonary vein drainage into the left atrium. Normal let atrial appendage without a thrombus. Normal size of the pulmonary artery. IMPRESSION: 1. Coronary calcium score of 0. This was 0 percentile for age and sex matched control. 2. Normal coronary origin with right dominance. 3. The study is challenging secondary to patients size, however there is no obvious evidence of CAD. Ena Dawley Electronically Signed: By: Ena Dawley On: 05/29/2017 17:56    ECG & Cardiac Imaging    EKG 05/28/17: sinus tachycardia with ventricular rate  105  EKG 05/28/17: atrial flutter with ventricular rate 147  Echocardiogram 01/02/16: Study Conclusions - Left ventricle: The cavity size was normal. Wall thickness was normal. Systolic function was normal. The estimated ejection fraction was in the range of 60% to 65%. Wall motion was normal; there were no regional wall motion abnormalities.  Impressions: - No cardiac source of emboli was indentified.  Myoview 06/19/15: IMPRESSION: 1. No reversible ischemia or infarction. 2. Normal left ventricular wall motion. 3. Left ventricular ejection fraction 70% 4. Low-risk stress test findings*.    Patient Profile    Ms Lachapelle has a PMH significant for asthma, OSA on CPAP, GERD, arthritis, and HTN. She presented to the [facility] with c/o chest tightness and palpitations. Upon arrival, she was found to be in Atrial flutter with RVR.   Assessment & Plan    1. Chest pain/ tightness/ shortness of breath: -- Troponin 0.10 --> 0.03, EKG without signs of ischemia.  -- Patient had a negative coronary CT angio done showing no obvious signs of CAD. In the setting of atrial flutter with RVR her symptoms and elevated troponin are likely due to demand ischemia. No further plans for ischemic evaluation at this time.  2. New onset atrial flutter with RVR: -- IV verapamil converted to NSR. Continues to be in sinus  rhythm. -- Has been converted to Cardizem CD 180 mg daily at bedtime -- This patients CHA2DS2-VASc Score and unadjusted Ischemic Stroke Rate (% per year) is equal to 3.2 % stroke rate/year from a score of 3 (age, female, HTN) -- Will restart Eliquis  Signed, Linus Mako, PA-C  05/30/2017, 9:50 AM    The patient was seen, examined and discussed with Delos Haring, PA-C and I agree with the above.   Coronary CTA showed calcium score of 0 and no plaque. She has had no further episodes of atrial flutter overnight. She is currently asymptomatic. We added Cardizem CD 180 mg po daily. She was previously on aliskiren, I will decrease 300-> 150 mg po daily.  Discharge today, follow up in 2-3 weeks with EP for follow up for atrial flutter.   Ena Dawley, MD 05/30/2017

## 2017-05-31 ENCOUNTER — Telehealth: Payer: Self-pay

## 2017-05-31 NOTE — Telephone Encounter (Signed)
Came home from the hospital last night .  Feeling much better.  No blockages, per cardiologist.  Follow up with cardiologist next week.

## 2017-06-02 ENCOUNTER — Telehealth: Payer: Self-pay | Admitting: Internal Medicine

## 2017-06-02 ENCOUNTER — Inpatient Hospital Stay (HOSPITAL_COMMUNITY)
Admission: EM | Admit: 2017-06-02 | Discharge: 2017-06-04 | DRG: 309 | Disposition: A | Discharge status: Home / Self Care | Payer: Medicare Other | Attending: Interventional Cardiology | Admitting: Interventional Cardiology

## 2017-06-02 DIAGNOSIS — I10 Essential (primary) hypertension: Secondary | ICD-10-CM | POA: Diagnosis not present

## 2017-06-02 DIAGNOSIS — I4892 Unspecified atrial flutter: Secondary | ICD-10-CM | POA: Diagnosis present

## 2017-06-02 DIAGNOSIS — Z885 Allergy status to narcotic agent status: Secondary | ICD-10-CM

## 2017-06-02 DIAGNOSIS — Z91040 Latex allergy status: Secondary | ICD-10-CM

## 2017-06-02 DIAGNOSIS — Z7901 Long term (current) use of anticoagulants: Secondary | ICD-10-CM

## 2017-06-02 DIAGNOSIS — G4733 Obstructive sleep apnea (adult) (pediatric): Secondary | ICD-10-CM | POA: Diagnosis not present

## 2017-06-02 DIAGNOSIS — Z888 Allergy status to other drugs, medicaments and biological substances status: Secondary | ICD-10-CM

## 2017-06-02 DIAGNOSIS — Z882 Allergy status to sulfonamides status: Secondary | ICD-10-CM

## 2017-06-02 DIAGNOSIS — R05 Cough: Secondary | ICD-10-CM | POA: Diagnosis not present

## 2017-06-02 DIAGNOSIS — Z96653 Presence of artificial knee joint, bilateral: Secondary | ICD-10-CM | POA: Diagnosis not present

## 2017-06-02 DIAGNOSIS — I483 Typical atrial flutter: Secondary | ICD-10-CM | POA: Diagnosis not present

## 2017-06-02 DIAGNOSIS — Z981 Arthrodesis status: Secondary | ICD-10-CM | POA: Diagnosis not present

## 2017-06-02 DIAGNOSIS — Z9981 Dependence on supplemental oxygen: Secondary | ICD-10-CM | POA: Diagnosis not present

## 2017-06-02 DIAGNOSIS — Z6841 Body Mass Index (BMI) 40.0 and over, adult: Secondary | ICD-10-CM | POA: Diagnosis not present

## 2017-06-02 DIAGNOSIS — R911 Solitary pulmonary nodule: Secondary | ICD-10-CM | POA: Diagnosis present

## 2017-06-02 DIAGNOSIS — K219 Gastro-esophageal reflux disease without esophagitis: Secondary | ICD-10-CM | POA: Diagnosis present

## 2017-06-02 DIAGNOSIS — I4891 Unspecified atrial fibrillation: Secondary | ICD-10-CM | POA: Diagnosis not present

## 2017-06-02 DIAGNOSIS — Z886 Allergy status to analgesic agent status: Secondary | ICD-10-CM

## 2017-06-02 DIAGNOSIS — E785 Hyperlipidemia, unspecified: Secondary | ICD-10-CM | POA: Diagnosis not present

## 2017-06-02 DIAGNOSIS — J4540 Moderate persistent asthma, uncomplicated: Secondary | ICD-10-CM | POA: Diagnosis present

## 2017-06-02 DIAGNOSIS — Z8673 Personal history of transient ischemic attack (TIA), and cerebral infarction without residual deficits: Secondary | ICD-10-CM | POA: Diagnosis not present

## 2017-06-02 DIAGNOSIS — Z79899 Other long term (current) drug therapy: Secondary | ICD-10-CM

## 2017-06-02 DIAGNOSIS — R000 Tachycardia, unspecified: Secondary | ICD-10-CM | POA: Diagnosis not present

## 2017-06-02 DIAGNOSIS — N289 Disorder of kidney and ureter, unspecified: Secondary | ICD-10-CM | POA: Diagnosis present

## 2017-06-02 DIAGNOSIS — R0602 Shortness of breath: Secondary | ICD-10-CM | POA: Diagnosis not present

## 2017-06-02 NOTE — Telephone Encounter (Signed)
Patient was recently admitted for AF w/RVR. Spontaneously cardioverted with IV verapamil. Today she had another episode of aflutter and her HR is in 140s. She is feeling chest pain with it and called in. She was told to take extra dose of 180 mg cartia that she takes and go to the ED for evaluation.

## 2017-06-03 ENCOUNTER — Inpatient Hospital Stay (HOSPITAL_COMMUNITY): Payer: Medicare Other

## 2017-06-03 ENCOUNTER — Emergency Department (HOSPITAL_COMMUNITY): Payer: Medicare Other

## 2017-06-03 ENCOUNTER — Other Ambulatory Visit: Payer: Self-pay

## 2017-06-03 ENCOUNTER — Encounter (HOSPITAL_COMMUNITY): Payer: Self-pay

## 2017-06-03 DIAGNOSIS — I4892 Unspecified atrial flutter: Secondary | ICD-10-CM

## 2017-06-03 DIAGNOSIS — R0602 Shortness of breath: Secondary | ICD-10-CM | POA: Diagnosis not present

## 2017-06-03 DIAGNOSIS — Z981 Arthrodesis status: Secondary | ICD-10-CM | POA: Diagnosis not present

## 2017-06-03 DIAGNOSIS — E785 Hyperlipidemia, unspecified: Secondary | ICD-10-CM | POA: Diagnosis present

## 2017-06-03 DIAGNOSIS — K219 Gastro-esophageal reflux disease without esophagitis: Secondary | ICD-10-CM | POA: Diagnosis present

## 2017-06-03 DIAGNOSIS — R002 Palpitations: Secondary | ICD-10-CM

## 2017-06-03 DIAGNOSIS — I4891 Unspecified atrial fibrillation: Secondary | ICD-10-CM | POA: Diagnosis present

## 2017-06-03 DIAGNOSIS — Z7901 Long term (current) use of anticoagulants: Secondary | ICD-10-CM | POA: Diagnosis not present

## 2017-06-03 DIAGNOSIS — E669 Obesity, unspecified: Secondary | ICD-10-CM | POA: Diagnosis not present

## 2017-06-03 DIAGNOSIS — G4733 Obstructive sleep apnea (adult) (pediatric): Secondary | ICD-10-CM | POA: Diagnosis present

## 2017-06-03 DIAGNOSIS — Z6841 Body Mass Index (BMI) 40.0 and over, adult: Secondary | ICD-10-CM | POA: Diagnosis not present

## 2017-06-03 DIAGNOSIS — R7989 Other specified abnormal findings of blood chemistry: Secondary | ICD-10-CM | POA: Diagnosis not present

## 2017-06-03 DIAGNOSIS — Z96653 Presence of artificial knee joint, bilateral: Secondary | ICD-10-CM | POA: Diagnosis present

## 2017-06-03 DIAGNOSIS — Z79899 Other long term (current) drug therapy: Secondary | ICD-10-CM | POA: Diagnosis not present

## 2017-06-03 DIAGNOSIS — Z885 Allergy status to narcotic agent status: Secondary | ICD-10-CM | POA: Diagnosis not present

## 2017-06-03 DIAGNOSIS — Z9981 Dependence on supplemental oxygen: Secondary | ICD-10-CM | POA: Diagnosis not present

## 2017-06-03 DIAGNOSIS — I483 Typical atrial flutter: Secondary | ICD-10-CM | POA: Diagnosis present

## 2017-06-03 DIAGNOSIS — G4730 Sleep apnea, unspecified: Secondary | ICD-10-CM | POA: Diagnosis not present

## 2017-06-03 DIAGNOSIS — J4540 Moderate persistent asthma, uncomplicated: Secondary | ICD-10-CM | POA: Diagnosis present

## 2017-06-03 DIAGNOSIS — Z91040 Latex allergy status: Secondary | ICD-10-CM | POA: Diagnosis not present

## 2017-06-03 DIAGNOSIS — I10 Essential (primary) hypertension: Secondary | ICD-10-CM | POA: Diagnosis present

## 2017-06-03 DIAGNOSIS — Z882 Allergy status to sulfonamides status: Secondary | ICD-10-CM | POA: Diagnosis not present

## 2017-06-03 DIAGNOSIS — Z886 Allergy status to analgesic agent status: Secondary | ICD-10-CM | POA: Diagnosis not present

## 2017-06-03 DIAGNOSIS — Z888 Allergy status to other drugs, medicaments and biological substances status: Secondary | ICD-10-CM | POA: Diagnosis not present

## 2017-06-03 DIAGNOSIS — R05 Cough: Secondary | ICD-10-CM | POA: Diagnosis not present

## 2017-06-03 DIAGNOSIS — N289 Disorder of kidney and ureter, unspecified: Secondary | ICD-10-CM | POA: Diagnosis present

## 2017-06-03 DIAGNOSIS — R911 Solitary pulmonary nodule: Secondary | ICD-10-CM | POA: Diagnosis present

## 2017-06-03 DIAGNOSIS — Z8673 Personal history of transient ischemic attack (TIA), and cerebral infarction without residual deficits: Secondary | ICD-10-CM | POA: Diagnosis not present

## 2017-06-03 LAB — BASIC METABOLIC PANEL
ANION GAP: 9 (ref 5–15)
BUN: 18 mg/dL (ref 6–20)
CO2: 25 mmol/L (ref 22–32)
Calcium: 9.5 mg/dL (ref 8.9–10.3)
Chloride: 105 mmol/L (ref 101–111)
Creatinine, Ser: 1.03 mg/dL — ABNORMAL HIGH (ref 0.44–1.00)
GFR, EST NON AFRICAN AMERICAN: 56 mL/min — AB (ref >60)
Glucose, Bld: 125 mg/dL — ABNORMAL HIGH (ref 65–99)
POTASSIUM: 3.5 mmol/L (ref 3.5–5.1)
SODIUM: 139 mmol/L (ref 135–145)

## 2017-06-03 LAB — CBC
HEMATOCRIT: 42.2 % (ref 36.0–46.0)
HEMOGLOBIN: 13.8 g/dL (ref 12.0–15.0)
MCH: 29.6 pg (ref 26.0–34.0)
MCHC: 32.7 g/dL (ref 30.0–36.0)
MCV: 90.4 fL (ref 78.0–100.0)
Platelets: 315 10*3/uL (ref 150–400)
RBC: 4.67 MIL/uL (ref 3.87–5.11)
RDW: 13.6 % (ref 11.5–15.5)
WBC: 13.4 10*3/uL — AB (ref 4.0–10.5)

## 2017-06-03 LAB — BRAIN NATRIURETIC PEPTIDE
B NATRIURETIC PEPTIDE 5: 155 pg/mL — AB (ref 0.0–100.0)
B Natriuretic Peptide: 314.7 pg/mL — ABNORMAL HIGH (ref 0.0–100.0)

## 2017-06-03 LAB — I-STAT TROPONIN, ED: TROPONIN I, POC: 0 ng/mL (ref 0.00–0.08)

## 2017-06-03 LAB — MRSA PCR SCREENING: MRSA BY PCR: NEGATIVE

## 2017-06-03 LAB — D-DIMER, QUANTITATIVE (NOT AT ARMC): D DIMER QUANT: 1.32 ug{FEU}/mL — AB (ref 0.00–0.50)

## 2017-06-03 MED ORDER — APIXABAN 5 MG PO TABS
5.0000 mg | ORAL_TABLET | Freq: Two times a day (BID) | ORAL | Status: DC
  Administered 2017-06-03 – 2017-06-04 (×3): 5 mg via ORAL
  Filled 2017-06-03 (×3): qty 1

## 2017-06-03 MED ORDER — IOPAMIDOL (ISOVUE-370) INJECTION 76%
INTRAVENOUS | Status: AC
  Administered 2017-06-03: 100 mL
  Filled 2017-06-03: qty 100

## 2017-06-03 MED ORDER — DEXTROSE 5 % IV SOLN
5.0000 mg/h | Freq: Once | INTRAVENOUS | Status: AC
  Administered 2017-06-03: 5 mg/h via INTRAVENOUS
  Filled 2017-06-03: qty 100

## 2017-06-03 MED ORDER — METOPROLOL TARTRATE 5 MG/5ML IV SOLN
2.5000 mg | Freq: Once | INTRAVENOUS | Status: AC
  Administered 2017-06-03: 2.5 mg via INTRAVENOUS
  Filled 2017-06-03: qty 5

## 2017-06-03 MED ORDER — LEVALBUTEROL HCL 0.63 MG/3ML IN NEBU
0.6300 mg | INHALATION_SOLUTION | Freq: Four times a day (QID) | RESPIRATORY_TRACT | Status: DC | PRN
  Administered 2017-06-03 (×2): 0.63 mg via RESPIRATORY_TRACT
  Filled 2017-06-03 (×2): qty 3

## 2017-06-03 MED ORDER — ONDANSETRON HCL 4 MG/2ML IJ SOLN: 4.0000 mg | Freq: Four times a day (QID) | INTRAMUSCULAR | Status: DC | PRN

## 2017-06-03 MED ORDER — ACETAMINOPHEN 325 MG PO TABS: 650.0000 mg | ORAL_TABLET | ORAL | Status: DC | PRN

## 2017-06-03 MED ORDER — FUROSEMIDE 10 MG/ML IJ SOLN
20.0000 mg | Freq: Once | INTRAMUSCULAR | Status: AC
  Administered 2017-06-03: 20 mg via INTRAVENOUS
  Filled 2017-06-03: qty 2

## 2017-06-03 MED ORDER — DILTIAZEM HCL 100 MG IV SOLR
5.0000 mg/h | Freq: Once | INTRAVENOUS | Status: DC
  Filled 2017-06-03 (×2): qty 100

## 2017-06-03 MED ORDER — VERAPAMIL HCL 2.5 MG/ML IV SOLN
2.5000 mg | Freq: Once | INTRAVENOUS | Status: AC
  Administered 2017-06-03: 2.5 mg via INTRAVENOUS
  Filled 2017-06-03: qty 2

## 2017-06-03 MED ORDER — FUROSEMIDE 10 MG/ML IJ SOLN
40.0000 mg | Freq: Once | INTRAMUSCULAR | Status: AC
  Administered 2017-06-03: 40 mg via INTRAVENOUS
  Filled 2017-06-03: qty 4

## 2017-06-03 MED ORDER — LEVALBUTEROL TARTRATE 45 MCG/ACT IN AERO: 1.0000 | INHALATION_SPRAY | Freq: Four times a day (QID) | RESPIRATORY_TRACT | Status: DC | PRN

## 2017-06-03 NOTE — ED Provider Notes (Addendum)
[facility] DEPT Provider Note   CSN: [id number] Arrival date & time: 06/02/17  2356     History   Chief Complaint Chief Complaint  Patient presents with  . Tachycardia    HPI Alison Richards is a 66 y.o. female.  Patient presents to the emergency department for evaluation of heart palpitations. Patient was hospitalized this week for new onset atrial fib/atrial flutter. She was converted to sinus rhythm in the hospital and discharged. She reports that she has follow-up next week with cardiology.  Patient reports that she was doing well since discharge. She is on Cardizem and Elliquis was since discharge. Patient reports that around 9 PM tonight she started having heart palpitations symptoms once again. She checked her pulse ox which was normal, but her heart rate was 144. Patient called cardiology on call and was told to take half a Cardizem, but she could not cut it in half because it is a capsule. She called EMS who documented heart rate in the 150 range, gave her Lopressor 2.5 mg IV with no improvement. Lopressor did drop her blood pressure, however. She is not experiencing any chest pain or shortness of breath currently.      Past Medical History:  Diagnosis Date  . BMI 50.0-59.9, adult ([place])   . Degenerative spinal arthritis   . Dependence on nocturnal oxygen therapy    used in addition to cpap  for hypoxmia 2 liters at hs  . GERD (gastroesophageal reflux disease)    PAST HX  H-PYLORI   . H/O hiatal hernia   . History of Helicobacter pylori infection   . History of transient ischemic attack (TIA) 12/31/2015  . Hyperlipemia   . Hypertension   . Left ureteral stone   . Moderate persistent asthma   . Nephrolithiasis    bilateral non-obstructive per ct 05-23-2016  . OA (osteoarthritis)   . Orthostatic lightheadedness   . OSA on CPAP    setting 8  . Pain in joint   . Palpitations   . PONV (postoperative nausea and vomiting)   . Wears dentures    full top  .  Wears glasses     Patient Active Problem List   Diagnosis Date Noted  . Atrial flutter with rapid ventricular response ([place]) 05/28/2017  . Atrial flutter ([place]) 05/28/2017  . Carpal tunnel syndrome of left wrist   . Stroke-like symptoms   . TIA (transient ischemic attack) 12/31/2015  . Peripheral neuropathy 12/31/2015  . Acute dyspnea 12/31/2015  . Asthma 12/31/2015  . Morbid obesity with BMI of 50.0-59.9, adult ([place]) 12/31/2015  . Bilateral lower extremity edema   . Left lumbar radiculitis   . Hyperlipidemia   . Chest pain 06/18/2015  . IFG (impaired fasting glucose) 02/13/2014  . S/P right knee replacement 11/14/2011  . ECZEMA 01/02/2011  . HIP PAIN, LEFT 12/05/2010  . THORACIC STRAIN 04/04/2010  . FOOT PAIN, BILATERAL 04/04/2010  . LEG EDEMA, BILATERAL 10/07/2009  . BACK PAIN, LUMBAR 06/11/2009  . Palpitations 03/16/2009  . PAIN IN JOINT, MULTIPLE SITES 03/08/2009  . OSA on CPAP 01/15/2009  . H/O: depression 08/07/2006  . HYPERTENSION, BENIGN SYSTEMIC 08/07/2006  . Asthma with exacerbation 08/07/2006    Past Surgical History:  Procedure Laterality Date  . ABDOMINAL HYSTERECTOMY  1999   complete  . ANTERIOR CERVICAL DECOMP/DISCECTOMY FUSION  06/04/2001   C4 -- C6  . BLADDER SURGERY  1995  . CARPAL TUNNEL RELEASE Bilateral left 06-28-2009;  right 11-28-2009  . CESAREAN SECTION  1979   w/ Bilateral Tubal Ligation  . COLONOSCOPY    . CYSTOSCOPY WITH STENT PLACEMENT Left 09/01/2016   Procedure: CYSTOSCOPY WITH STENT PLACEMENT;  Surgeon: Franchot Gallo, MD;  Location: [facility];  Service: Urology;  Laterality: Left;  . CYSTOSCOPY/RETROGRADE/URETEROSCOPY/STONE EXTRACTION WITH BASKET Left 09/01/2016   Procedure: CYSTOSCOPY/RETROGRADE/URETEROSCOPY/STONE EXTRACTION WITH BASKET;  Surgeon: Franchot Gallo, MD;  Location: [facility];  Service: Urology;  Laterality: Left;  . ESOPHAGEAL MANOMETRY  09/02/2012   Procedure: ESOPHAGEAL MANOMETRY (EM);  Surgeon: Sable Feil, MD;   Location: [facility];  Service: Endoscopy;  Laterality: N/A;  . HOLMIUM LASER APPLICATION Left 30/10/6008   Procedure: HOLMIUM LASER APPLICATION;  Surgeon: Franchot Gallo, MD;  Location: [facility];  Service: Urology;  Laterality: Left;  . LAPAROSCOPIC CHOLECYSTECTOMY  01/15/2007  . TONSILLECTOMY  age 71  . TOTAL KNEE ARTHROPLASTY  11/14/2011   Procedure: TOTAL KNEE ARTHROPLASTY;  Surgeon: Mauri Pole, MD;  Location: [facility];  Service: Orthopedics;  Laterality: Right;  combined with general block  . TOTAL KNEE ARTHROPLASTY Left 01/21/2007  . TRIGGER FINGER RELEASE Right 12/25/2013   Procedure: RIGHT LONG TRIGGER RELEASE ;  Surgeon: Tennis Must, MD;  Location: [facility];  Service: Orthopedics;  Laterality: Right;    OB History    No data available       Home Medications    Prior to Admission medications   Medication Sig Start Date End Date Taking? Authorizing Provider  albuterol (PROVENTIL HFA;VENTOLIN HFA) 108 (90 Base) MCG/ACT inhaler Inhale 2 puffs into the lungs every 6 (six) hours as needed for wheezing. 10/19/16 10/19/17  Hali Marry, MD  Aliskiren-Hydrochlorothiazide (TEKTURNA HCT) 150-25 MG TABS Take 1 Dose by mouth daily. 05/30/17   Geradine Girt, DO  AMBULATORY NON FORMULARY MEDICATION Medication Name: Overnight pulse oximetry. Patient is [name] on oxygen and CPAP but she's been waking up with low pulse ox is in the morning and headaches. Just want to confirm that she is actually maintaining her oxygen level overnight. 12/29/16   Hali Marry, MD  AMBULATORY NON FORMULARY MEDICATION Medication Name: Nocturnal oxygen 2 liters. Patient taking differently: Medication Name: Nocturnal oxygen 2 liters at bedtime. 01/05/17   Hali Marry, MD  apixaban (ELIQUIS) 5 MG TABS tablet Take 1 tablet (5 mg total) by mouth 2 (two) times daily. 05/30/17   Geradine Girt, DO  diltiazem (CARDIZEM CD) 180 MG 24 hr capsule Take 1 capsule (180 mg total) by mouth  at bedtime. 05/30/17   Geradine Girt, DO  naproxen sodium (ANAPROX) 220 MG tablet Take 220-440 mg by mouth 2 (two) times daily as needed (for pain).    [provider]    Family History Family History  Problem Relation Age of Onset  . Alzheimer's disease Mother 55  . Hypertension Mother   . Hyperlipidemia Mother   . Diverticulitis Mother   . Alcohol abuse Father   . Cancer Father   . COPD Father   . Lung cancer Father   . Skin cancer Father   . Prostate cancer Father   . Colon cancer Neg Hx     Social History Social History  Substance Use Topics  . Smoking status: Never Smoker  . Smokeless tobacco: Never Used  . Alcohol use No     Allergies   Acetaminophen; Dilaudid [hydromorphone hcl]; Hydromorphone; Angiotensin receptor blockers; Erythromycin ethylsuccinate; Lisinopril; Losartan potassium-hctz; Morphine sulfate; Valsartan; Latex; Sulfa antibiotics; Sulfonamide derivatives; and Tape   Review of  Systems Review of Systems  Cardiovascular: Positive for palpitations.  All other systems reviewed and are negative.    Physical Exam Updated Vital Signs BP 103/78   Pulse (!) 143   Temp 98.9 F (37.2 C) (Oral)   Resp (!) 24   Ht 5\' 4"  (1.626 m)   Wt 131.1 kg (289 lb)   SpO2 96%   BMI 49.61 kg/m   Physical Exam  Constitutional: She is oriented to person, place, and time. She appears well-developed and well-nourished. No distress.  HENT:  Head: Normocephalic and atraumatic.  Right Ear: Hearing normal.  Left Ear: Hearing normal.  Nose: Nose normal.  Mouth/Throat: Oropharynx is clear and moist and mucous membranes are normal.  Eyes: Pupils are equal, round, and reactive to light. Conjunctivae and EOM are normal.  Neck: Normal range of motion. Neck supple.  Cardiovascular: Regular rhythm, S1 normal and S2 normal.  Tachycardia present.  Exam reveals no gallop and no friction rub.   No murmur heard. Pulmonary/Chest: Effort normal and breath sounds normal. No  respiratory distress. She exhibits no tenderness.  Abdominal: Soft. Normal appearance and bowel sounds are normal. There is no hepatosplenomegaly. There is no tenderness. There is no rebound, no guarding, no tenderness at McBurney's point and negative Murphy's sign. No hernia.  Musculoskeletal: Normal range of motion.  Neurological: She is alert and oriented to person, place, and time. She has normal strength. No cranial nerve deficit or sensory deficit. Coordination normal. GCS eye subscore is 4. GCS verbal subscore is 5. GCS motor subscore is 6.  Skin: Skin is warm, dry and intact. No rash noted. No cyanosis.  Psychiatric: She has a normal mood and affect. Her speech is normal and behavior is normal. Thought content normal.  Nursing note and vitals reviewed.    ED Treatments / Results  Labs (all labs ordered are listed, but only abnormal results are displayed) Labs Reviewed  BASIC METABOLIC PANEL - Abnormal; Notable for the following:       Result Value   Glucose, Bld 125 (*)    Creatinine, Ser 1.03 (*)    GFR calc non Af Amer 56 (*)    All other components within normal limits  CBC - Abnormal; Notable for the following:    WBC 13.4 (*)    All other components within normal limits    EKG  EKG Interpretation None       Radiology No results found.  Procedures Procedures (including critical care time)  Medications Ordered in ED Medications  metoprolol tartrate (LOPRESSOR) injection 2.5 mg (2.5 mg Intravenous Given 06/03/17 0133)  verapamil (ISOPTIN) injection 2.5 mg (2.5 mg Intravenous Given 06/03/17 0511)  furosemide (LASIX) injection 40 mg (40 mg Intravenous Given 06/03/17 0607)     Initial Impression / Assessment and Plan / ED Course  I have reviewed the triage vital signs and the nursing notes.  Pertinent labs & imaging results that were available during my care of the patient were reviewed by me and considered in my medical decision making (see chart for details).       Patient presents to the emergency department for evaluation of rapid heartbeat. Patient was hospitalized earlier this week for new onset atrial fibrillation/atrial flutter. She was converted with verapamil. She was initiated on our request during hospital stay. She was scheduled for follow-up with cardiology as an outpatient. Patient initiated on Cardizem as well. Tonight she had recurrent onset of rapid heartbeat. She took additional Cardizem without improvement. She was brought  to the ER by EMS. She was given IV Lopressor which caused drop in her blood pressure but no improvement in her heart rate.  At arrival to the ER she had been given a fluid bolus by EMS, blood pressure improved. She was given additional Lopressor. This did drop her blood pressure down to the 110 range or so, but did not help her heart rate. This was discussed with Dr. Eula Fried, on-call for cardiology. He recommended IV verapamil, as during her previous hospital stay this was what converted her. He also recommended administering Lasix 40mg  IV. He did not recommend electrical cardioversion at this time.  Patient was given verapamil. She became transiently hypotensive. Heart rate did not improve significantly. While monitoring her after the verapamil was given, she became acutely diaphoretic and short of breath. Examination revealed mild wheezing. She does report a history of asthma and wheezing occasionally as an outpatient. She has not hypoxic. Based on her lack of response to rate control measures and now onset of bronchospastic symptoms, will require hospitalization. Discussed briefly with Dr. Hal Hope, hospitalist service. She was admitted to the hospitalist service previously. He did, however, feel that since she has failed therapy that was initially set for her and is now back in atrial flutter, she would be better served on the cardiology service.  Discussed with Dr. Irish Lack. Recommend further rate control. Feels that  Cardizem would be the best medication. I discussed this with pharmacy, there is Cardizem available for IV infusion, will initiate.  CRITICAL CARE Performed by: Orpah Greek   Total critical care time: 30 minutes  Critical care time was exclusive of separately billable procedures and treating other patients.  Critical care was necessary to treat or prevent imminent or life-threatening deterioration.  Critical care was time spent personally by me on the following activities: development of treatment plan with patient and/or surrogate as well as nursing, discussions with consultants, evaluation of patient's response to treatment, examination of patient, obtaining history from patient or surrogate, ordering and performing treatments and interventions, ordering and review of laboratory studies, ordering and review of radiographic studies, pulse oximetry and re-evaluation of patient's condition.   Final Clinical Impressions(s) / ED Diagnoses   Final diagnoses:  Typical atrial flutter [facility])    New Prescriptions New Prescriptions   No medications on file     Orpah Greek, MD 06/03/17 6294    Orpah Greek, MD 06/03/17 [phone]

## 2017-06-03 NOTE — H&P (Addendum)
Patient ID: Alison Richards Richards MRN: [id number], DOB/AGE: August 02, 1951   Admit date: 06/02/2017   Primary Physician: Hali Marry, MD Primary Cardiologist: Dr. Meda Coffee  Pt. Profile:  66 year old female with morbid obesity,asthma, OSA on CPAP, GERD, arthritis, HTN, negative coronary CTA with calcium score of 0 and recent admission for new onset atrial flutter, presenting back to the ED with recurrent symptomatic atrial flutter.   Problem List  Past Medical History:  Diagnosis Date  . BMI 50.0-59.9, adult (Alison Richards)   . Degenerative spinal arthritis   . Dependence on nocturnal oxygen therapy    used in addition to cpap  for hypoxmia 2 liters at hs  . GERD (gastroesophageal reflux disease)    PAST HX  H-PYLORI   . H/O hiatal hernia   . History of Helicobacter pylori infection   . History of transient ischemic attack (TIA) 12/31/2015  . Hyperlipemia   . Hypertension   . Left ureteral stone   . Moderate persistent asthma   . Nephrolithiasis    bilateral non-obstructive per ct 05-23-2016  . OA (osteoarthritis)   . Orthostatic lightheadedness   . OSA on CPAP    setting 8  . Pain in joint   . Palpitations   . PONV (postoperative nausea and vomiting)   . Wears dentures    full top  . Wears glasses     Past Surgical History:  Procedure Laterality Date  . ABDOMINAL HYSTERECTOMY  1999   complete  . ANTERIOR CERVICAL DECOMP/DISCECTOMY FUSION  06/04/2001   C4 -- C6  . BLADDER SURGERY  1995  . CARPAL TUNNEL RELEASE Bilateral left 06-28-2009;  right 11-28-2009  . CESAREAN SECTION  1979   w/ Bilateral Tubal Ligation  . COLONOSCOPY    . CYSTOSCOPY WITH STENT PLACEMENT Left 09/01/2016   Procedure: CYSTOSCOPY WITH STENT PLACEMENT;  Surgeon: Franchot Gallo, MD;  Location: [facility];  Service: Urology;  Laterality: Left;  . CYSTOSCOPY/RETROGRADE/URETEROSCOPY/STONE EXTRACTION WITH BASKET Left 09/01/2016   Procedure: CYSTOSCOPY/RETROGRADE/URETEROSCOPY/STONE EXTRACTION WITH  BASKET;  Surgeon: Franchot Gallo, MD;  Location: [facility];  Service: Urology;  Laterality: Left;  . ESOPHAGEAL MANOMETRY  09/02/2012   Procedure: ESOPHAGEAL MANOMETRY (EM);  Surgeon: Sable Feil, MD;  Location: [facility];  Service: Endoscopy;  Laterality: N/A;  . HOLMIUM LASER APPLICATION Left 60/10/930   Procedure: HOLMIUM LASER APPLICATION;  Surgeon: Franchot Gallo, MD;  Location: [facility];  Service: Urology;  Laterality: Left;  . LAPAROSCOPIC CHOLECYSTECTOMY  01/15/2007  . TONSILLECTOMY  age 37  . TOTAL KNEE ARTHROPLASTY  11/14/2011   Procedure: TOTAL KNEE ARTHROPLASTY;  Surgeon: Mauri Pole, MD;  Location: [facility];  Service: Orthopedics;  Laterality: Right;  combined with general block  . TOTAL KNEE ARTHROPLASTY Left 01/21/2007  . TRIGGER FINGER RELEASE Right 12/25/2013   Procedure: RIGHT LONG TRIGGER RELEASE ;  Surgeon: Tennis Must, MD;  Location: [facility];  Service: Orthopedics;  Laterality: Right;     Allergies  Allergies  Allergen Reactions  . Acetaminophen Other (See Comments)    AVOID ANY MEDS WITH ANY TRACE AMOUNTS hallucinations, cold sweats, headache, metallic taste, tongue swelling  . Dilaudid [Hydromorphone Hcl] Anaphylaxis and Swelling    Throat closing up  . Hydromorphone Anaphylaxis and Shortness Of Breath    Pt had a tightening in her throat  . Angiotensin Receptor Blockers Other (See Comments)    Joint aches  . Erythromycin Ethylsuccinate Diarrhea    gi symptoms, stomach swelling  .  Lisinopril Other (See Comments)    ASTHMA LIKE SYMPTOMS, coughing  . Losartan Potassium-Hctz     REACTION: JOINTS aching, hurting all over  . Morphine Sulfate Nausea And Vomiting    Projectile vomiting  . Valsartan     REACTION: Hurting all over  . Latex Itching and Rash    SENSITIVE TO SOME ADHESIVES.  . Sulfa Antibiotics Swelling and Rash    Eyes swelled shut  . Sulfonamide Derivatives Swelling and Rash  . Tape Rash    Red and bruising, Please  use "paper" tape only    HPI  66 year old female with morbid obesity,asthma, OSA on CPAP, GERD, arthritis,  HTN with prior negative ischemic workup by negative nuclear stress test in 05/2015, LVEF normal at the time, as well as recent admission for new onset atrial flutter, from 05/28/17-05/30/17. She converted to NSR with IV verapamil and was transitioned to PO Cardizem. CHA2DS2-VASc Score and unadjusted Ischemic Stroke Rate (% per year) is equal to 3.2 % stroke rate/year from a score of 3(age, female, HTN) and patient was started on Eliquis. When patient was in atrial flutter, she noted associated chest tightness thus it was recommended that she undergo ischemic w/u. Pt had a coronary CTA done which revealed a 4 mm right middle lobe pulmonary nodule. Coronary calcium score of 0. Normal coronary origin with right dominance. No obvious signs of CAD.  She was discharged home by IM on 05/30/17. She presented back to the ED this morning with recurrent palpitations and noted to be in recurrent atrial flutter.   Labs notable for WBC of 13.4. She is afebrile. CXR unremarkable. UA pending. BMP unremarkable. K normal at 3.5. Mild renal insufficiency at 1.03. Last admit 7/31, TSH was elevated at 7.1, however free T4 was normal. She reports full compliance with meds since discharge. She has been avoiding triggers such as caffeine and ETOH.   Home Medications  Prior to Admission medications   Medication Sig Start Date End Date Taking? Authorizing Provider  albuterol (PROVENTIL HFA;VENTOLIN HFA) 108 (90 Base) MCG/ACT inhaler Inhale 2 puffs into the lungs every 6 (six) hours as needed for wheezing. 10/19/16 10/19/17  Hali Marry, MD  Aliskiren-Hydrochlorothiazide (TEKTURNA HCT) 150-25 MG TABS Take 1 Dose by mouth daily. 05/30/17   Geradine Girt, DO  AMBULATORY NON FORMULARY MEDICATION Medication Name: Overnight pulse oximetry. Patient is Alison Richards on oxygen and CPAP but she's been waking up with low pulse ox is  in the morning and headaches. Just want to confirm that she is actually maintaining her oxygen level overnight. 12/29/16   Hali Marry, MD  AMBULATORY NON FORMULARY MEDICATION Medication Name: Nocturnal oxygen 2 liters. Patient taking differently: Medication Name: Nocturnal oxygen 2 liters at bedtime. 01/05/17   Hali Marry, MD  apixaban (ELIQUIS) 5 MG TABS tablet Take 1 tablet (5 mg total) by mouth 2 (two) times daily. 05/30/17   Geradine Girt, DO  diltiazem (CARDIZEM CD) 180 MG 24 hr capsule Take 1 capsule (180 mg total) by mouth at bedtime. 05/30/17   Geradine Girt, DO  naproxen sodium (ANAPROX) 220 MG tablet Take 220-440 mg by mouth 2 (two) times daily as needed (for pain).    [provider]    Family History  Family History  Problem Relation Age of Onset  . Alzheimer's disease Mother 13  . Hypertension Mother   . Hyperlipidemia Mother   . Diverticulitis Mother   . Alcohol abuse Father   . Cancer  Father   . COPD Father   . Lung cancer Father   . Skin cancer Father   . Prostate cancer Father   . Colon cancer Neg Hx     Social History  Social History   Social History  . Marital status: Widowed    Spouse name: Herbie [place]  . Number of children: 3  . Years of education: N/A   Occupational History  . LAB [employer]   Social History Main Topics  . Smoking status: Never Smoker  . Smokeless tobacco: Never Used  . Alcohol use No  . Drug use: No  . Sexual activity: Yes    Partners: Male   Other Topics Concern  . Not on file   Social History Narrative   Daily caffeine      Review of Systems General:  No chills, fever, night sweats or weight changes.  Cardiovascular:  No chest pain, +dyspnea on exertion, - edema, orthopnea, palpitations, paroxysmal nocturnal dyspnea. Dermatological: No rash, lesions/masses Respiratory: No cough, dyspnea Urologic: No hematuria, dysuria Abdominal:   No nausea, vomiting, diarrhea, bright red blood  per rectum, melena, or hematemesis Neurologic:  No visual changes, wkns, changes in mental status. All other systems reviewed and are otherwise negative except as noted above.  Physical Exam  Blood pressure (!) 122/105, pulse (!) 149, temperature 98.9 F (37.2 C), temperature source Oral, resp. rate 18, height 5\' 4"  (1.626 m), weight 289 lb (131.1 kg), SpO2 94 %.  General: Pleasant, NAD, obese  Psych: Normal affect. Neuro: Alert and oriented X 3. Moves all extremities spontaneously. HEENT: Normal  Neck: Supple without bruits or JVD. Lungs:  Resp regular and unlabored, CTA. Heart: irregular rhythm, tachy rate, no s3, s4, or murmurs. Abdomen: Soft, non-tender, non-distended, BS + x 4.  Extremities: No clubbing, cyanosis or edema. DP/PT/Radials 2+ and equal bilaterally.  Labs  Troponin [facility] Test)  Recent Labs  06/03/17 0643  TROPIPOC 0.00   No results for input(s): CKTOTAL, CKMB, TROPONINI in the last 72 hours. Lab Results  Component Value Date   WBC 13.4 (H) 06/03/2017   HGB 13.8 06/03/2017   HCT 42.2 06/03/2017   MCV 90.4 06/03/2017   PLT 315 06/03/2017    Recent Labs Lab 06/03/17 0011  NA 139  K 3.5  CL 105  CO2 25  BUN 18  CREATININE 1.03*  CALCIUM 9.5  GLUCOSE 125*   Lab Results  Component Value Date   CHOL 178 01/01/2016   HDL 42 01/01/2016   LDLCALC 108 (H) 01/01/2016   TRIG 142 01/01/2016   Lab Results  Component Value Date   DDIMER 0.30 08/29/2016     Radiology/Studies  Dg Chest 2 View  Result Date: 05/28/2017 CLINICAL DATA:  Chest pressure, palpitations EXAM: CHEST  2 VIEW COMPARISON:  08/29/2016 FINDINGS: Lungs are clear.  No pleural effusion or pneumothorax. The heart is normal in size. Degenerative changes of the visualized thoracolumbar spine. IMPRESSION: Normal chest radiographs. Electronically Signed   By: Julian Hy M.D.   On: 05/28/2017 20:48   Ct Coronary Morph W/cta Cor W/score W/ca W/cm &/or Wo/cm  Addendum Date:  05/30/2017   ADDENDUM REPORT: 05/30/2017 08:15 EXAM: OVER-READ INTERPRETATION  CT CHEST The following report is an over-read performed by radiologist Dr. Rebekah Chesterfield [facility] on 05/30/2017. This over-read does not include interpretation of cardiac or coronary anatomy or pathology. The coronary calcium score/cardiac CTA interpretation by the cardiologist is attached. COMPARISON:  Chest CT 12/31/2015.  FINDINGS: 3 x 5 mm (mean diameter 4 mm) nodule in the right middle lobe (axial image 19 of series 12). Within the visualized portions of the thorax there are no larger more suspicious appearing pulmonary nodules or masses, there is no acute consolidative airspace disease, no pleural effusions, no pneumothorax and no lymphadenopathy. Visualized portions of the upper abdomen are unremarkable. There are no aggressive appearing lytic or blastic lesions noted in the visualized portions of the skeleton. IMPRESSION: 1. 4 mm right middle lobe pulmonary nodule. This is stable compared to prior study 12/31/2015, considered definitively benign. Electronically Signed   By: Vinnie Langton M.D.   On: 05/30/2017 08:15   Result Date: 05/30/2017 CLINICAL DATA:  27 -year-old female with chest pain. EXAM: Cardiac/Coronary  CT TECHNIQUE: The patient was scanned on a [employer]. FINDINGS: A 120 kV prospective scan was triggered in the descending thoracic aorta at 111 HU's. Axial non-contrast 3 mm slices were carried out through the heart. The data set was analyzed on a dedicated work station and scored using the [employer]. 20 mg of iv Metoprolol and 0.8 mg of sl NTG was given. The 3D data set was reconstructed in 5% intervals of the 67-82 % of the R-R cycle. Diastolic phases were analyzed on a dedicated work station using MPR, MIP and VRT modes. The patient received 80 cc of contrast. Aorta:  Normal size.  Trivial calcifications.  No dissection. Aortic Valve:  Trileaflet.  No calcifications. Coronary  Arteries:  Normal coronary origin.  Right dominance. RCA is a large dominant artery that gives rise to PDA and PLVB. There is no plaque. Left main is a large artery that gives rise to LAD, ramus intermedius and LCX arteries. LAD is a large vessel that gives rise to one diagonal branch and has no plaque. RI is a very small artery that has no plaque. LCX is a small non-dominant artery that gives rise to one OM1 branch. There is no plaque. Other findings: Normal pulmonary vein drainage into the left atrium. Normal let atrial appendage without a thrombus. Normal size of the pulmonary artery. IMPRESSION: 1. Coronary calcium score of 0. This was 0 percentile for age and sex matched control. 2. Normal coronary origin with right dominance. 3. The study is challenging secondary to patients size, however there is no obvious evidence of CAD. Ena Dawley Electronically Signed: By: Ena Dawley On: 05/29/2017 17:56   Dg Chest Port 1 View  Result Date: 06/03/2017 CLINICAL DATA:  Acute onset of shortness of breath and cough. Initial encounter. EXAM: PORTABLE CHEST 1 VIEW COMPARISON:  Chest radiograph performed 05/28/2017 FINDINGS: The lungs are well-aerated. Mild bibasilar atelectasis is noted. There is no evidence of pleural effusion or pneumothorax. The cardiomediastinal silhouette is borderline normal in size. No acute osseous abnormalities are seen. Cervical spinal fusion hardware is noted. IMPRESSION: Mild bibasilar atelectasis noted.  Lungs otherwise clear. Electronically Signed   By: Garald Balding M.D.   On: 06/03/2017 06:43    ECG  Atrial flutter, 140 bpm  -- personally reviewed  Telemetry  Atrial flutter in the 140s -150s  -- personally reviewed     ASSESSMENT AND PLAN  1. Symptomatic Recurrent Atrial Flutter w/ RVR: Pt admitted last week with new onset atrial flutter w/ RVR and converted with IV Verapamil>>transitioned to PO Cardizem and started on Eliquis for anticoagulation. Ischemic w/u  negative. Coronary CTA negative with calcium score of 0. Electrolytes and thyroid function stable. Plan was for outpatient f/u with  EP. Pt now with recurrent atrial flutter w/ RVR and highly symptomatic. BP is soft but stable. K WNL at 3.5. WBC elevated at 13.4 but pt afebrile. CXR negative. UA pending. We will admit for rate control and will ask EP to see to consider other therapies. We will make NPO at midnight for possible TEE aflutter ablation tomorrow (pt has only been on Eliquis for 1 week). Continue IV Cardizem and Eliquis for now. MD to see and will provide further recommendations.   Signed, Lyda Jester, PA-C, MHS 06/03/2017, 7:44 AM [facility] Pager: [phone]  I have examined the patient and reviewed assessment and plan and discussed with patient.  Agree with above as stated.  Recurrent atrial flutter with significant palpitations. At this point, she would benefit from ablation. TEE would likely be required since she has only been on anticoagulation for about a week. EP to see patient tomorrow regarding this. Would make patient nothing by mouth past midnight. Admit to stepdown for diltiazem drip titration.  Of note, the patient's daughter who is 43 years old had an ablation recently, but ended up with a pacemaker.  Larae Grooms

## 2017-06-03 NOTE — Progress Notes (Signed)
   Was called to pt's beside given increased dyspnea. Pt had previously converted to NSR on IV dilt. HR in the 80s to 90. BP soft in the upper 76H systolic. O2 sats stable on RA. CXR earlier today was negative for infiltrates and edema. BNP 314. On arrival, pt was noticeably more air hungry compared to when seen in ED. Lung exam notable for expiratory wheezing. No rales. Xopenx was ordered and breathing improved some. Dr. Irish Lack was notified. Order was given to give an additional 20 mg IV Lasix and to check a D-dimer to r/o PE. Will monitor response to lasix and f/u on lab results.   Lyda Jester, PA

## 2017-06-03 NOTE — ED Notes (Signed)
Pt c/o chest pain and under left shoulder blade, rate 3/10. Denies shortness of breath. HR 150, BP 102/49

## 2017-06-03 NOTE — Progress Notes (Signed)
Pt is on CPAP at this time. Pressure adjusted per patient home regimen. 8cmh2o. No distress noted. Pt has her own nasal pillows.

## 2017-06-03 NOTE — ED Notes (Signed)
Cardiology PA Brittany at bedside 

## 2017-06-03 NOTE — ED Notes (Signed)
Family at bedside. 

## 2017-06-03 NOTE — ED Notes (Addendum)
Pt reports a sharp onset of epigastric/chest pain that lasted a few seconds. MD aware.

## 2017-06-03 NOTE — ED Triage Notes (Signed)
Per EMS, pt from home. Pt was recently diagnosed with Afib/flutter. Pt reports feeling some sob and a fluttering feeling in her chest that started at 9pm. Pt took her 180mg  Cardizem at 2100 tonight with her heart rate in the 150s. EMS gave 2.5 metoprolol and her heart rate went to the 130s. Pt also received 1071ml bolus of NS.

## 2017-06-03 NOTE — ED Notes (Addendum)
Pt called out feeling sob. Pt was diaphoretic and had external wheezing. MD aware.

## 2017-06-03 NOTE — Progress Notes (Signed)
CRITICAL VALUE ALERT  Critical Value:  Troponin (4.45)  Date & Time Notied:  06/03/17 1424  Provider Notified: Lyda Jester  Orders Received/Actions taken: MD notified. Pt on heparin and nitroglycerin gtt

## 2017-06-04 ENCOUNTER — Other Ambulatory Visit: Payer: Self-pay

## 2017-06-04 ENCOUNTER — Encounter (HOSPITAL_COMMUNITY): Payer: Self-pay | Admitting: Nurse Practitioner

## 2017-06-04 DIAGNOSIS — E669 Obesity, unspecified: Secondary | ICD-10-CM

## 2017-06-04 DIAGNOSIS — G4730 Sleep apnea, unspecified: Secondary | ICD-10-CM

## 2017-06-04 DIAGNOSIS — I10 Essential (primary) hypertension: Secondary | ICD-10-CM

## 2017-06-04 LAB — BASIC METABOLIC PANEL
Anion gap: 9 (ref 5–15)
BUN: 16 mg/dL (ref 6–20)
CO2: 29 mmol/L (ref 22–32)
Calcium: 9.1 mg/dL (ref 8.9–10.3)
Chloride: 103 mmol/L (ref 101–111)
Creatinine, Ser: 1.06 mg/dL — ABNORMAL HIGH (ref 0.44–1.00)
GFR, EST NON AFRICAN AMERICAN: 54 mL/min — AB (ref >60)
Glucose, Bld: 122 mg/dL — ABNORMAL HIGH (ref 65–99)
POTASSIUM: 3.6 mmol/L (ref 3.5–5.1)
SODIUM: 141 mmol/L (ref 135–145)

## 2017-06-04 LAB — CBC
HEMATOCRIT: 39.9 % (ref 36.0–46.0)
Hemoglobin: 13.1 g/dL (ref 12.0–15.0)
MCH: 29.6 pg (ref 26.0–34.0)
MCHC: 32.8 g/dL (ref 30.0–36.0)
MCV: 90.3 fL (ref 78.0–100.0)
PLATELETS: 306 10*3/uL (ref 150–400)
RBC: 4.42 MIL/uL (ref 3.87–5.11)
RDW: 13.7 % (ref 11.5–15.5)
WBC: 11.9 10*3/uL — AB (ref 4.0–10.5)

## 2017-06-04 LAB — PROTIME-INR
INR: 1.34
Prothrombin Time: 16.7 seconds — ABNORMAL HIGH (ref 11.4–15.2)

## 2017-06-04 MED ORDER — DILTIAZEM HCL ER COATED BEADS 180 MG PO CP24
180.0000 mg | ORAL_CAPSULE | Freq: Every day | ORAL | Status: DC
  Administered 2017-06-04: 180 mg via ORAL
  Filled 2017-06-04: qty 1

## 2017-06-04 MED ORDER — FLECAINIDE ACETATE 50 MG PO TABS: 50.0000 mg | ORAL_TABLET | Freq: Two times a day (BID) | ORAL | 1 refills | Status: DC

## 2017-06-04 MED ORDER — FLECAINIDE ACETATE 50 MG PO TABS
50.0000 mg | ORAL_TABLET | Freq: Two times a day (BID) | ORAL | Status: DC
  Administered 2017-06-04: 50 mg via ORAL
  Filled 2017-06-04: qty 1

## 2017-06-04 NOTE — Consult Note (Signed)
ELECTROPHYSIOLOGY CONSULT NOTE    Patient ID: Alison Richards MRN: [id number], DOB/AGE: Dec 15, 1950 66 y.o.  Admit date: 06/02/2017 Date of Consult: 06/04/2017  Primary Physician: Hali Marry, MD Primary Cardiologist: Meda Coffee  Reason for Consultation: atrial flutter  HPI:  Alison Richards is a 66 y.o. female is referred by Dr Irish Lack for evaluation of atrial flutter.  Past medical history is significant for obesity, OSA on CPAP, hypertension, prior TIA, and arthritis. She was admitted last week for new onset atrial flutter that terminated with verapamil. She underwent coronary CTA that admission with a calcium score of 0.  Plans were for outpatient EP evaluation, but she went into recurrent atrial flutter the day of admission and presented to the ER for further evaluation. She was placed on Diltiazem drip and has since converted to SR.  She is currently feeling much improved. She is symptomatic with atrial flutter with palpitations, shortness of breath, and "tingling" in her arms and neck.  She did have acute shortness of breath yesterday afternoon after converting to SR that improved with Lasix.   Echo 11-Feb-2016 demonstrated EF 60-65%, no RWMA, LA 33. D dimer was elevated this admission, CT negative for PE.  WBC has also been elevated since recent admission. No fevers or chills.  She is planning to move to [place] this weekend to live with her son and daughter in law. Her husband passed away in 02-11-23.    Past Medical History:  Diagnosis Date  . BMI 50.0-59.9, adult ([place])   . Degenerative spinal arthritis   . Dependence on nocturnal oxygen therapy    used in addition to cpap  for hypoxmia 2 liters at hs  . GERD (gastroesophageal reflux disease)    PAST HX  H-PYLORI   . H/O hiatal hernia   . History of Helicobacter pylori infection   . History of transient ischemic attack (TIA) 12/31/2015  . Hyperlipemia   . Hypertension   . Left ureteral stone   . Moderate  persistent asthma   . Nephrolithiasis    bilateral non-obstructive per ct 05-23-2016  . OA (osteoarthritis)   . Orthostatic lightheadedness   . OSA on CPAP    setting 8  . Pain in joint   . Palpitations   . PONV (postoperative nausea and vomiting)   . Wears dentures    full top  . Wears glasses      Surgical History:  Past Surgical History:  Procedure Laterality Date  . ABDOMINAL HYSTERECTOMY  1999   complete  . ANTERIOR CERVICAL DECOMP/DISCECTOMY FUSION  06/04/2001   C4 -- C6  . BLADDER SURGERY  1995  . CARPAL TUNNEL RELEASE Bilateral left 06-28-2009;  right 11-28-2009  . CESAREAN SECTION  1979   w/ Bilateral Tubal Ligation  . COLONOSCOPY    . CYSTOSCOPY WITH STENT PLACEMENT Left 09/01/2016   Procedure: CYSTOSCOPY WITH STENT PLACEMENT;  Surgeon: Franchot Gallo, MD;  Location: [facility];  Service: Urology;  Laterality: Left;  . CYSTOSCOPY/RETROGRADE/URETEROSCOPY/STONE EXTRACTION WITH BASKET Left 09/01/2016   Procedure: CYSTOSCOPY/RETROGRADE/URETEROSCOPY/STONE EXTRACTION WITH BASKET;  Surgeon: Franchot Gallo, MD;  Location: [facility];  Service: Urology;  Laterality: Left;  . ESOPHAGEAL MANOMETRY  09/02/2012   Procedure: ESOPHAGEAL MANOMETRY (EM);  Surgeon: Sable Feil, MD;  Location: [facility];  Service: Endoscopy;  Laterality: N/A;  . HOLMIUM LASER APPLICATION Left 81/10/9145   Procedure: HOLMIUM LASER APPLICATION;  Surgeon: Franchot Gallo, MD;  Location: [facility];  Service: Urology;  Laterality: Left;  . LAPAROSCOPIC CHOLECYSTECTOMY  01/15/2007  . TONSILLECTOMY  age 60  . TOTAL KNEE ARTHROPLASTY  11/14/2011   Procedure: TOTAL KNEE ARTHROPLASTY;  Surgeon: Mauri Pole, MD;  Location: [facility];  Service: Orthopedics;  Laterality: Right;  combined with general block  . TOTAL KNEE ARTHROPLASTY Left 01/21/2007  . TRIGGER FINGER RELEASE Right 12/25/2013   Procedure: RIGHT LONG TRIGGER RELEASE ;  Surgeon: Tennis Must, MD;  Location: [facility];  Service:  Orthopedics;  Laterality: Right;     Prescriptions Prior to Admission  Medication Sig Dispense Refill Last Dose  . albuterol (PROVENTIL HFA;VENTOLIN HFA) 108 (90 Base) MCG/ACT inhaler Inhale 2 puffs into the lungs every 6 (six) hours as needed for wheezing. 1 Inhaler 1 rescue at rescue  . Aliskiren-Hydrochlorothiazide (TEKTURNA HCT) 150-25 MG TABS Take 1 Dose by mouth daily. (Patient taking differently: Take 2 tablets by mouth daily. ) 30 each 0 06/02/2017 at Unknown time  . AMBULATORY NON FORMULARY MEDICATION Medication Name: Overnight pulse oximetry. Patient is [name] on oxygen and CPAP but she's been waking up with low pulse ox is in the morning and headaches. Just want to confirm that she is actually maintaining her oxygen level overnight. 1 Units 0 06/02/2017 at Unknown time  . AMBULATORY NON FORMULARY MEDICATION Medication Name: Nocturnal oxygen 2 liters. (Patient taking differently: Medication Name: Nocturnal oxygen 2 liters at bedtime.) 1 vial 0 06/02/2017 at Unknown time  . apixaban (ELIQUIS) 5 MG TABS tablet Take 1 tablet (5 mg total) by mouth 2 (two) times daily. 60 tablet 0 06/02/2017 at 1900  . diltiazem (CARDIZEM CD) 180 MG 24 hr capsule Take 1 capsule (180 mg total) by mouth at bedtime. 30 capsule 0 06/02/2017 at Unknown time  . naproxen sodium (ANAPROX) 220 MG tablet Take 220-440 mg by mouth 2 (two) times daily as needed (for pain).   Past Month at Unknown time    Inpatient Medications: . apixaban  5 mg Oral BID    Allergies:  Allergies  Allergen Reactions  . Acetaminophen Other (See Comments)    AVOID ANY MEDS WITH ANY TRACE AMOUNTS hallucinations, cold sweats, headache, metallic taste, tongue swelling  . Dilaudid [Hydromorphone Hcl] Anaphylaxis and Swelling    Throat closing up  . Hydromorphone Anaphylaxis and Shortness Of Breath    Pt had a tightening in her throat  . Angiotensin Receptor Blockers Other (See Comments)    Joint aches  . Erythromycin Ethylsuccinate Diarrhea    gi  symptoms, stomach swelling  . Lisinopril Other (See Comments)    ASTHMA LIKE SYMPTOMS, coughing  . Losartan Potassium-Hctz     REACTION: JOINTS aching, hurting all over  . Morphine Sulfate Nausea And Vomiting    Projectile vomiting  . Valsartan     REACTION: Hurting all over  . Latex Itching and Rash    SENSITIVE TO SOME ADHESIVES.  . Sulfa Antibiotics Swelling and Rash    Eyes swelled shut  . Sulfonamide Derivatives Swelling and Rash  . Tape Rash    Red and bruising, Please use "paper" tape only    Social History   Social History  . Marital status: Widowed    Spouse name: Herbie [place]  . Number of children: 3  . Years of education: N/A   Occupational History  . LAB [employer]   Social History Main Topics  . Smoking status: Never Smoker  . Smokeless tobacco: Never Used  . Alcohol use No  . Drug use: No  . Sexual activity: Yes  Partners: Male   Other Topics Concern  . Not on file   Social History Narrative   Daily caffeine      Family History  Problem Relation Age of Onset  . Alzheimer's disease Mother 34  . Hypertension Mother   . Hyperlipidemia Mother   . Diverticulitis Mother   . Alcohol abuse Father   . Cancer Father   . COPD Father   . Lung cancer Father   . Skin cancer Father   . Prostate cancer Father   . Colon cancer Neg Hx      Review of Systems: All other systems reviewed and are otherwise negative except as noted above.  Physical Exam: Vitals:   06/03/17 2012 06/03/17 2302 06/04/17 0200 06/04/17 0500  BP: (!) 101/46  (!) 101/57 96/60  Pulse: 89 89  80  Resp:      Temp: 98.6 F (37 C)   98.2 F (36.8 C)  TempSrc: Oral   Oral  SpO2: 97% 97%  96%  Weight:      Height:        GEN- The patient is obese appearing, alert and oriented x 3 today.   HEENT: normocephalic, atraumatic; sclera clear, conjunctiva pink; hearing intact; oropharynx clear; neck supple  Lungs- Clear to ausculation bilaterally, normal work of breathing.   No wheezes, rales, rhonchi Heart- Regular rate and rhythm, no murmurs, rubs or gallops  GI- soft, non-tender, non-distended, bowel sounds present Extremities- no clubbing, cyanosis, or edema  MS- no significant deformity or atrophy Skin- warm and dry, no rash or lesion Psych- euthymic mood, full affect Neuro- strength and sensation are intact  Labs:  Lab Results  Component Value Date   WBC 13.4 (H) 06/03/2017   HGB 13.8 06/03/2017   HCT 42.2 06/03/2017   MCV 90.4 06/03/2017   PLT 315 06/03/2017    Recent Labs Lab 06/03/17 0011  NA 139  K 3.5  CL 105  CO2 25  BUN 18  CREATININE 1.03*  CALCIUM 9.5  GLUCOSE 125*      Radiology/Studies: Dg Chest 2 View  Result Date: 05/28/2017 CLINICAL DATA:  Chest pressure, palpitations EXAM: CHEST  2 VIEW COMPARISON:  08/29/2016 FINDINGS: Lungs are clear.  No pleural effusion or pneumothorax. The heart is normal in size. Degenerative changes of the visualized thoracolumbar spine. IMPRESSION: Normal chest radiographs. Electronically Signed   By: Julian Hy M.D.   On: 05/28/2017 20:48   Ct Angio Chest Pe W Or Wo Contrast  Result Date: 06/03/2017 CLINICAL DATA:  Shortness of breath.  D-dimer 1.32. EXAM: CT ANGIOGRAPHY CHEST WITH CONTRAST TECHNIQUE: Multidetector CT imaging of the chest was performed using the standard protocol during bolus administration of intravenous contrast. Multiplanar CT image reconstructions and MIPs were obtained to evaluate the vascular anatomy. CONTRAST:  59 cc Isovue 370 COMPARISON:  Chest CT dated 12/31/2015. FINDINGS: Cardiovascular: Evaluation of the most peripheral segmental and subsegmental pulmonary arteries is limited by patient breathing motion artifact and patient body habitus, however, there is no pulmonary embolism identified within the main, lobar or central segmental pulmonary arteries. Heart size is upper normal. No pericardial effusion. No thoracic aortic aneurysm or dissection. Mediastinum/Nodes:  Esophagus is unremarkable. No mass or enlarged lymph nodes seen within the mediastinum or perihilar regions. Trachea is unremarkable. Lungs/Pleura: Mild scarring/atelectasis at each lung base. Mosaic pattern bilaterally suggesting some degree of air trapping. No evidence of pneumonia. Thickening of the walls of the central bronchi suggesting bronchiolitis. No pleural effusion or pneumothorax. Upper  Abdomen: Limited images of the upper abdomen are unremarkable. Musculoskeletal: Mild degenerative spurring within the thoracic spine. No acute or suspicious osseous finding. Review of the MIP images confirms the above findings. IMPRESSION: 1. No pulmonary embolism identified, study limitations detailed above. 2. Bronchitic changes centrally. Bilateral mosaic pattern suggesting associated air trapping. No consolidating pneumonia or pleural effusion. Electronically Signed   By: Franki Cabot M.D.   On: 06/03/2017 18:57   Ct Coronary Morph W/cta Cor W/score W/ca W/cm &/or Wo/cm  Addendum Date: 05/30/2017   ADDENDUM REPORT: 05/30/2017 08:15 EXAM: OVER-READ INTERPRETATION  CT CHEST The following report is an over-read performed by radiologist Dr. Rebekah Chesterfield [facility] on 05/30/2017. This over-read does not include interpretation of cardiac or coronary anatomy or pathology. The coronary calcium score/cardiac CTA interpretation by the cardiologist is attached. COMPARISON:  Chest CT 12/31/2015. FINDINGS: 3 x 5 mm (mean diameter 4 mm) nodule in the right middle lobe (axial image 19 of series 12). Within the visualized portions of the thorax there are no larger more suspicious appearing pulmonary nodules or masses, there is no acute consolidative airspace disease, no pleural effusions, no pneumothorax and no lymphadenopathy. Visualized portions of the upper abdomen are unremarkable. There are no aggressive appearing lytic or blastic lesions noted in the visualized portions of the skeleton. IMPRESSION: 1. 4 mm  right middle lobe pulmonary nodule. This is stable compared to prior study 12/31/2015, considered definitively benign. Electronically Signed   By: Vinnie Langton M.D.   On: 05/30/2017 08:15   Result Date: 05/30/2017 CLINICAL DATA:  69 -year-old female with chest pain. EXAM: Cardiac/Coronary  CT TECHNIQUE: The patient was scanned on a [employer]. FINDINGS: A 120 kV prospective scan was triggered in the descending thoracic aorta at 111 HU's. Axial non-contrast 3 mm slices were carried out through the heart. The data set was analyzed on a dedicated work station and scored using the Ellis. 20 mg of iv Metoprolol and 0.8 mg of sl NTG was given. The 3D data set was reconstructed in 5% intervals of the 67-82 % of the R-R cycle. Diastolic phases were analyzed on a dedicated work station using MPR, MIP and VRT modes. The patient received 80 cc of contrast. Aorta:  Normal size.  Trivial calcifications.  No dissection. Aortic Valve:  Trileaflet.  No calcifications. Coronary Arteries:  Normal coronary origin.  Right dominance. RCA is a large dominant artery that gives rise to PDA and PLVB. There is no plaque. Left main is a large artery that gives rise to LAD, ramus intermedius and LCX arteries. LAD is a large vessel that gives rise to one diagonal branch and has no plaque. RI is a very small artery that has no plaque. LCX is a small non-dominant artery that gives rise to one OM1 branch. There is no plaque. Other findings: Normal pulmonary vein drainage into the left atrium. Normal let atrial appendage without a thrombus. Normal size of the pulmonary artery. IMPRESSION: 1. Coronary calcium score of 0. This was 0 percentile for age and sex matched control. 2. Normal coronary origin with right dominance. 3. The study is challenging secondary to patients size, however there is no obvious evidence of CAD. Ena Dawley Electronically Signed: By: Ena Dawley On: 05/29/2017 17:56   Dg Chest Port 1  View  Result Date: 06/03/2017 CLINICAL DATA:  Acute onset of shortness of breath and cough. Initial encounter. EXAM: PORTABLE CHEST 1 VIEW COMPARISON:  Chest radiograph performed 05/28/2017 FINDINGS: The lungs  are well-aerated. Mild bibasilar atelectasis is noted. There is no evidence of pleural effusion or pneumothorax. The cardiomediastinal silhouette is borderline normal in size. No acute osseous abnormalities are seen. Cervical spinal fusion hardware is noted. IMPRESSION: Mild bibasilar atelectasis noted.  Lungs otherwise clear. Electronically Signed   By: Garald Balding M.D.   On: 06/03/2017 06:43    EKG: typical atrial flutter, V rate 140  (personally reviewed)  TELEMETRY: atrial flutter -> SR, no significant post termination pause (personally reviewed)  Assessment/Plan: 1.  Typical atrial flutter The patient has symptomatic typical atrial flutter and has had recurrence on CCB. Would prefer to offer definitive treatment with catheter ablation, however, she is moving to [place] this weekend. She also has OSA and I think would be better served with anesthesia support for her procedure which we would be unable to get until Thursday. For now, would convert IV diltiazem to po and add Flecainide 50mg  twice daily (cardiac CT with calcium score of 0, normal intervals on EKG). I have recommended that she establish with EP in [place] when she moves. I have given the name of Dr Tomie China in [place] for follow up With episode of shortness of breath yesterday likely 2/2 flash pulmonary edema, she is nervous about going home today. Jaleel Allen start Flecainide and monitor with plans to discharge tomorrow morning. Follow up in AF clinic on Friday prior to leaving town for EKG She does have history of TIA - it would not be unreasonable to consider ILR after ablation to look for recurrent atrial arrhythmias that could have caused prior TIA Continue Eliquis for now for [facility] of 5  2.   Obesity Body mass index is 49.25 kg/m. Weight loss encouraged She is optimistic about lifestyle changes after her move  3.  HTN Stable No change required today  4.  OSA on CPAP Compliance with CPAP encouraged   Dr Curt Bears to see later today   Signed, Chanetta Marshall, NP 06/04/2017 6:14 AM  I have seen and examined this patient with Chanetta Marshall.  Agree with above, note added to reflect my findings.  On exam, RRR, no murmurs, lungs clear. Admitted with atrial flutter. Reverted back to sinus rhythm today. Plan for flecainide 50 mg BID along with her diltiazem. She went into flash edema when she converted. Zaylynn Rickett continue to monitor on flecainide today. Likely discharge tomorrow.    Shantia Sanford M. Amiyah Shryock MD 06/04/2017 8:16 AM  3

## 2017-06-04 NOTE — Care Management Note (Signed)
Case Management Note  Patient Details  Name: Alison Richards MRN: [id number] Date of Birth: 1951/07/05  Subjective/Objective:      afib and aflutter, started on Cardizem and Flecainide               Action/Plan: Discharge Planning: Chart reviewed. Theme park manager and Flecainide medication is $19.00. No NCM needs identified.   PCP Beatrice Lecher D MD  Expected Discharge Date:  06/04/17               Expected Discharge Plan:  Home/Self Care  In-House Referral:  NA  Discharge planning Services  CM Consult  Post Acute Care Choice:  NA Choice offered to:  NA  DME Arranged:  N/A DME Agency:  NA  HH Arranged:  NA HH Agency:  NA  Status of Service:  Completed, signed off  If discussed at [place] of Stay Meetings, dates discussed:    Additional Comments:  Erenest Rasher, RN 06/04/2017, 3:12 PM

## 2017-06-04 NOTE — Discharge Summary (Signed)
ELECTROPHYSIOLOGY PROCEDURE DISCHARGE SUMMARY    Patient ID: Alison Richards,  MRN: [id number], DOB/AGE: 03-28-1951 66 y.o.  Admit date: 06/02/2017 Discharge date: 06/04/2017  Primary Care Physician: Hali Marry, MD Primary Cardiologist: Meda Coffee  Primary Discharge Diagnosis:  1.  Atrial fibrillation and atrial flutter   Secondary Discharge Diagnoses: 1.  OSA on CPAP 2.  GERD 3.  Morbid obesity 4.  Asthma 5.  HTN   Allergies  Allergen Reactions  . Acetaminophen Other (See Comments)    AVOID ANY MEDS WITH ANY TRACE AMOUNTS hallucinations, cold sweats, headache, metallic taste, tongue swelling  . Dilaudid [Hydromorphone Hcl] Anaphylaxis and Swelling    Throat closing up  . Hydromorphone Anaphylaxis and Shortness Of Breath    Pt had a tightening in her throat  . Angiotensin Receptor Blockers Other (See Comments)    Joint aches  . Erythromycin Ethylsuccinate Diarrhea    gi symptoms, stomach swelling  . Lisinopril Other (See Comments)    ASTHMA LIKE SYMPTOMS, coughing  . Losartan Potassium-Hctz     REACTION: JOINTS aching, hurting all over  . Morphine Sulfate Nausea And Vomiting    Projectile vomiting  . Valsartan     REACTION: Hurting all over  . Latex Itching and Rash    SENSITIVE TO SOME ADHESIVES.  . Sulfa Antibiotics Swelling and Rash    Eyes swelled shut  . Sulfonamide Derivatives Swelling and Rash  . Tape Rash    Red and bruising, Please use "paper" tape only      Brief HPI/Hospital Course: Alison Richards is a 66 y.o. female is referred by Dr Irish Lack for evaluation of atrial flutter.  Past medical history is significant for obesity, OSA on CPAP, hypertension, prior TIA, and arthritis. She was admitted last week for new onset atrial flutter that terminated with verapamil. She underwent coronary CTA that admission with a calcium score of 0.  Plans were for outpatient EP evaluation, but she went into recurrent atrial flutter the day of  admission and presented to the ER for further evaluation. She was placed on Diltiazem drip and has since converted to SR.  She is currently feeling much improved. She is symptomatic with atrial flutter with palpitations, shortness of breath, and "tingling" in her arms and neck.  She did have acute shortness of breath yesterday afternoon after converting to SR that improved with Lasix. Echo 02-04-2016 demonstrated EF 60-65%, no RWMA, LA 33. D dimer was elevated this admission, CT negative for PE.  WBC has also been elevated since recent admission. No fevers or chills. She is planning to move to [place] this weekend to live with her son and daughter in law. Her husband passed away in February 04, 2023. She was seen by Dr Curt Bears and started on Flecainide 50mg  twice daily in conjunction with Diltiazem. I have given her the name of Dr Tomie China in [place] to follow up for possible AF/flutter ablation in the future. She Dymphna Wadley follow up with AF clinic on Friday prior to moving to [place].   Physical Exam: Vitals:   06/03/17 2302 06/04/17 0200 06/04/17 0500 06/04/17 1408  BP:  (!) 101/57 96/60 127/61  Pulse: 89  80 81  Resp:    18  Temp:   98.2 F (36.8 C) 98.8 F (37.1 C)  TempSrc:   Oral Oral  SpO2: 97%  96% 97%  Weight:      Height:         Labs:   Lab Results  Component  Value Date   WBC 11.9 (H) 06/04/2017   HGB 13.1 06/04/2017   HCT 39.9 06/04/2017   MCV 90.3 06/04/2017   PLT 306 06/04/2017     Recent Labs Lab 06/04/17 0646  NA 141  K 3.6  CL 103  CO2 29  BUN 16  CREATININE 1.06*  CALCIUM 9.1  GLUCOSE 122*     Discharge Medications:  Current Discharge Medication List    START taking these medications   Details  flecainide (TAMBOCOR) 50 MG tablet Take 1 tablet (50 mg total) by mouth every 12 (twelve) hours. Qty: 60 tablet, Refills: 1      CONTINUE these medications which have NOT CHANGED   Details  albuterol (PROVENTIL HFA;VENTOLIN HFA) 108 (90 Base) MCG/ACT inhaler  Inhale 2 puffs into the lungs every 6 (six) hours as needed for wheezing. Qty: 1 Inhaler, Refills: 1    Aliskiren-Hydrochlorothiazide (TEKTURNA HCT) 150-25 MG TABS Take 1 Dose by mouth daily. Qty: 30 each, Refills: 0    !! AMBULATORY NON FORMULARY MEDICATION Medication Name: Overnight pulse oximetry. Patient is [name] on oxygen and CPAP but she's been waking up with low pulse ox is in the morning and headaches. Just want to confirm that she is actually maintaining her oxygen level overnight. Qty: 1 Units, Refills: 0   Associated Diagnoses: Acute nonintractable headache, unspecified headache type; OSA on CPAP    !! AMBULATORY NON FORMULARY MEDICATION Medication Name: Nocturnal oxygen 2 liters. Qty: 1 vial, Refills: 0    apixaban (ELIQUIS) 5 MG TABS tablet Take 1 tablet (5 mg total) by mouth 2 (two) times daily. Qty: 60 tablet, Refills: 0    diltiazem (CARDIZEM CD) 180 MG 24 hr capsule Take 1 capsule (180 mg total) by mouth at bedtime. Qty: 30 capsule, Refills: 0    naproxen sodium (ANAPROX) 220 MG tablet Take 220-440 mg by mouth 2 (two) times daily as needed (for pain).     !! - Potential duplicate medications found. Please discuss with provider.      Disposition: Pt is being discharged home today in good condition. Discharge Instructions    Diet - low sodium heart healthy    Complete by:  As directed    Increase activity slowly    Complete by:  As directed      Follow-up Information    [facility] Follow up on 06/08/2017.   Specialty:  Cardiology Why:  at [facility] information: [address] [id number] [place] [phone]          Duration of Discharge Encounter: Greater than 30 minutes including physician time.  Signed, Chanetta Marshall, NP 06/04/2017 2:52 PM  I have seen and examined this patient with Chanetta Marshall.  Agree with above, note added to reflect my findings.  On exam, RRR, no murmurs, lungs  clear. Presented to the hospital with atrial flutter, converted with diltiazem. Had short run of atrial fibrillation prior to conversion. Started on flecainide. Patient to move to [facility] and Arnetha Silverthorne be referred to EP close to her new home.    Quinn Quam M. Senta Kantor MD 06/04/2017 8:25 PM

## 2017-06-04 NOTE — Discharge Instructions (Signed)

## 2017-06-04 NOTE — Plan of Care (Signed)
Problem: Pain Managment: Goal: General experience of comfort will improve Outcome: Completed/Met Date Met: 06/04/17 Pt currently pain free. Instructed patient to notify RN if pain level increases   

## 2017-06-06 ENCOUNTER — Encounter: Payer: Self-pay | Admitting: Family Medicine

## 2017-06-06 ENCOUNTER — Ambulatory Visit (INDEPENDENT_AMBULATORY_CARE_PROVIDER_SITE_OTHER): Payer: Medicare Other | Admitting: Family Medicine

## 2017-06-06 ENCOUNTER — Inpatient Hospital Stay: Payer: Medicare Other | Admitting: Family Medicine

## 2017-06-06 VITALS — BP 138/72 | HR 86 | Wt 284.0 lb

## 2017-06-06 DIAGNOSIS — J209 Acute bronchitis, unspecified: Secondary | ICD-10-CM

## 2017-06-06 DIAGNOSIS — I10 Essential (primary) hypertension: Secondary | ICD-10-CM

## 2017-06-06 DIAGNOSIS — J40 Bronchitis, not specified as acute or chronic: Secondary | ICD-10-CM | POA: Diagnosis not present

## 2017-06-06 DIAGNOSIS — R05 Cough: Secondary | ICD-10-CM | POA: Diagnosis not present

## 2017-06-06 DIAGNOSIS — R059 Cough, unspecified: Secondary | ICD-10-CM

## 2017-06-06 MED ORDER — LEVALBUTEROL TARTRATE 45 MCG/ACT IN AERO: 2.0000 | INHALATION_SPRAY | Freq: Four times a day (QID) | RESPIRATORY_TRACT | 12 refills | Status: DC | PRN

## 2017-06-06 MED ORDER — PREDNISONE 20 MG PO TABS: 40.0000 mg | ORAL_TABLET | Freq: Every day | ORAL | 0 refills | Status: DC

## 2017-06-06 MED ORDER — LEVALBUTEROL TARTRATE 45 MCG/ACT IN AERO: 2.0000 | INHALATION_SPRAY | Freq: Four times a day (QID) | RESPIRATORY_TRACT | 3 refills | Status: AC | PRN

## 2017-06-06 MED ORDER — BUDESONIDE 180 MCG/ACT IN AEPB: 2.0000 | INHALATION_SPRAY | Freq: Two times a day (BID) | RESPIRATORY_TRACT | 1 refills | Status: AC

## 2017-06-06 MED ORDER — BENZONATATE 200 MG PO CAPS: 200.0000 mg | ORAL_CAPSULE | Freq: Three times a day (TID) | ORAL | 0 refills | Status: DC | PRN

## 2017-06-06 NOTE — Patient Instructions (Signed)
Dr Tomie China in [place] to follow up for possible AF/flutter ablation in the future

## 2017-06-06 NOTE — Progress Notes (Signed)
Subjective:    Patient ID: Alison Richards, female    DOB: 05/17/1951, 66 y.o.   MRN: [id number]  HPI 66 year old femaleWith a history of asthma and hypertension comes in today for recent hospital follow-up. She went to the emergency department on 05/25/2017 at [facility] for headache. She has a remote history of migraine headaches but at that time was complaining of a dull throbbing bilateral headache that had lasted for hours. She felt like it was a squeezing band around her head. She is not experiencing any slurred speech or speech difficulty. Blood pressure was uncontrolled at that time. The do CT angiogram that did not show any significant carotid or vertebral artery stenosis. No arterial dissection. She was discharged home with Flexeril and Aleve.    She then went back to the emergency department 3 days later on July 30 at [facility] for anterior chest pain and palpitations. She was admitted and then discharged home on August 1 with atrial flutter with rapid ventricular response. She had a negative coronary CT angiogram done at that time. She was given IV verapamil and converted to normal sinus rhythm. And started on Cardizem CD 180 mg daily. She was also started on Eliquis as her chads to the asked score was 3.  Unfortunately, 2 days after discharge on August 4 she went back to the hospital and was admitted for 2 days and discharged home on August 6 for atrial fibrillation and atrial flutter again. They ruled out pulmonary embolism at that time with CT. Echocardiogram showed EF of 6065%. She was then started on flecainide and diltiazem.       Review of Systems   BP 138/72   Pulse 86   Wt 284 lb (128.8 kg)   SpO2 97%   PF 240 L/min Comment: yellow  BMI 48.75 kg/m     Allergies  Allergen Reactions  . Acetaminophen Other (See Comments)    AVOID ANY MEDS WITH ANY TRACE AMOUNTS hallucinations, cold sweats, headache, metallic taste, tongue swelling  . Dilaudid [Hydromorphone Hcl]  Anaphylaxis and Swelling    Throat closing up  . Hydromorphone Anaphylaxis and Shortness Of Breath    Pt had a tightening in her throat  . Angiotensin Receptor Blockers Other (See Comments)    Joint aches  . Erythromycin Ethylsuccinate Diarrhea    gi symptoms, stomach swelling  . Lisinopril Other (See Comments)    ASTHMA LIKE SYMPTOMS, coughing  . Losartan Potassium-Hctz     REACTION: JOINTS aching, hurting all over  . Morphine Sulfate Nausea And Vomiting    Projectile vomiting  . Valsartan     REACTION: Hurting all over  . Latex Itching and Rash    SENSITIVE TO SOME ADHESIVES.  . Sulfa Antibiotics Swelling and Rash    Eyes swelled shut  . Sulfonamide Derivatives Swelling and Rash  . Tape Rash    Red and bruising, Please use "paper" tape only    Past Medical History:  Diagnosis Date  . BMI 50.0-59.9, adult ([place])   . Degenerative spinal arthritis   . Dependence on nocturnal oxygen therapy    used in addition to cpap  for hypoxmia 2 liters at hs  . GERD (gastroesophageal reflux disease)    PAST HX  H-PYLORI   . H/O hiatal hernia   . History of Helicobacter pylori infection   . History of transient ischemic attack (TIA) 12/31/2015  . Hyperlipemia   . Hypertension   . Left ureteral stone   . Moderate  persistent asthma   . Nephrolithiasis    bilateral non-obstructive per ct 05-23-2016  . OA (osteoarthritis)   . Orthostatic lightheadedness   . OSA on CPAP    setting 8  . PONV (postoperative nausea and vomiting)   . Wears dentures    full top  . Wears glasses     Past Surgical History:  Procedure Laterality Date  . ABDOMINAL HYSTERECTOMY  1999   complete  . ANTERIOR CERVICAL DECOMP/DISCECTOMY FUSION  06/04/2001   C4 -- C6  . BLADDER SURGERY  1995  . CARPAL TUNNEL RELEASE Bilateral left 06-28-2009;  right 11-28-2009  . CESAREAN SECTION  1979   w/ Bilateral Tubal Ligation  . COLONOSCOPY    . CYSTOSCOPY WITH STENT PLACEMENT Left 09/01/2016   Procedure: CYSTOSCOPY  WITH STENT PLACEMENT;  Surgeon: Franchot Gallo, MD;  Location: [facility];  Service: Urology;  Laterality: Left;  . CYSTOSCOPY/RETROGRADE/URETEROSCOPY/STONE EXTRACTION WITH BASKET Left 09/01/2016   Procedure: CYSTOSCOPY/RETROGRADE/URETEROSCOPY/STONE EXTRACTION WITH BASKET;  Surgeon: Franchot Gallo, MD;  [facility];  Service: Urology;  Laterality: Left;  . ESOPHAGEAL MANOMETRY  09/02/2012   Procedure: ESOPHAGEAL MANOMETRY (EM);  Surgeon: Sable Feil, MD;  [facility];  Service: Endoscopy;  Laterality: N/A;  . HOLMIUM LASER APPLICATION Left 60/10/930   Procedure: HOLMIUM LASER APPLICATION;  Surgeon: Franchot Gallo, MD;  [facility];  Service: Urology;  Laterality: Left;  . LAPAROSCOPIC CHOLECYSTECTOMY  01/15/2007  . TONSILLECTOMY  age 3  . TOTAL KNEE ARTHROPLASTY  11/14/2011   Procedure: TOTAL KNEE ARTHROPLASTY;  Surgeon: Mauri Pole, MD;  [facility];  Service: Orthopedics;  Laterality: Right;  combined with general block  . TOTAL KNEE ARTHROPLASTY Left 01/21/2007  . TRIGGER FINGER RELEASE Right 12/25/2013   Procedure: RIGHT LONG TRIGGER RELEASE ;  Surgeon: Tennis Must, MD;  Location: [facility];  Service: Orthopedics;  Laterality: Right;    Social History   Social History  . Marital status: Widowed    Spouse name: Herbie Baltimore  . Number of children: 3  . Years of education: N/A   Occupational History  . LAB [employer]   Social History Main Topics  . Smoking status: Never Smoker  . Smokeless tobacco: Never Used  . Alcohol use No  . Drug use: No  . Sexual activity: Yes    Partners: Male   Other Topics Concern  . Not on file   Social History Narrative   Daily caffeine     Family History  Problem Relation Age of Onset  . Alzheimer's disease Mother 82  . Hypertension Mother   . Hyperlipidemia Mother   . Diverticulitis Mother   . Alcohol abuse Father   . Cancer Father   . COPD Father   . Lung cancer Father    . Skin cancer Father   . Prostate cancer Father   . Colon cancer Neg Hx     Outpatient Encounter Prescriptions as of 06/06/2017  Medication Sig  . Aliskiren-Hydrochlorothiazide (TEKTURNA HCT) 150-25 MG TABS Take 1 Dose by mouth daily. (Patient taking differently: Take 2 tablets by mouth daily. )  . AMBULATORY NON FORMULARY MEDICATION Medication Name: Overnight pulse oximetry. Patient is [name] on oxygen and CPAP but she's been waking up with low pulse ox is in the morning and headaches. Just want to confirm that she is actually maintaining her oxygen level overnight.  . AMBULATORY NON FORMULARY MEDICATION Medication Name: Nocturnal oxygen 2 liters. (Patient taking differently: Medication Name: Nocturnal  oxygen 2 liters at bedtime.)  . apixaban (ELIQUIS) 5 MG TABS tablet Take 1 tablet (5 mg total) by mouth 2 (two) times daily.  Marland Kitchen diltiazem (CARDIZEM CD) 180 MG 24 hr capsule Take 1 capsule (180 mg total) by mouth at bedtime.  . flecainide (TAMBOCOR) 50 MG tablet Take 1 tablet (50 mg total) by mouth every 12 (twelve) hours.  . naproxen sodium (ANAPROX) 220 MG tablet Take 220-440 mg by mouth 2 (two) times daily as needed (for pain).  Marland Kitchen albuterol (PROVENTIL HFA;VENTOLIN HFA) 108 (90 Base) MCG/ACT inhaler Inhale 2 puffs into the lungs every 6 (six) hours as needed for wheezing. (Patient not taking: Reported on 06/06/2017)  . benzonatate (TESSALON) 200 MG capsule Take 1 capsule (200 mg total) by mouth 3 (three) times daily as needed for cough.  . budesonide (PULMICORT FLEXHALER) 180 MCG/ACT inhaler Inhale 2 puffs into the lungs 2 (two) times daily.  Marland Kitchen levalbuterol (XOPENEX HFA) 45 MCG/ACT inhaler Inhale 2 puffs into the lungs every 6 (six) hours as needed for wheezing.  . predniSONE (DELTASONE) 20 MG tablet Take 2 tablets (40 mg total) by mouth daily.  . [DISCONTINUED] levalbuterol (XOPENEX HFA) 45 MCG/ACT inhaler Inhale 2 puffs into the lungs every 6 (six) hours as needed for wheezing.  . [DISCONTINUED]  predniSONE (DELTASONE) 20 MG tablet Take 2 tablets (40 mg total) by mouth daily.   No facility-administered encounter medications on file as of 06/06/2017.           Objective:   Physical Exam  Constitutional: She is oriented to person, place, and time. She appears well-developed and well-nourished.  HENT:  Head: Normocephalic and atraumatic.  Right Ear: External ear normal.  Left Ear: External ear normal.  Nose: Nose normal.  Mouth/Throat: Oropharynx is clear and moist.  TMs and canals are clear.   Eyes: Pupils are equal, round, and reactive to light. Conjunctivae and EOM are normal.  Neck: Neck supple. No thyromegaly present.  Cardiovascular: Normal rate, regular rhythm and normal heart sounds.   Pulmonary/Chest: Effort normal and breath sounds normal. She has no wheezes.  Lymphadenopathy:    She has no cervical adenopathy.  Neurological: She is alert and oriented to person, place, and time.  Skin: Skin is warm and dry.  Psychiatric: She has a normal mood and affect.        Assessment & Plan:  Atrial flutter-currently on Eliquis. Rate controlled with flecainide and diltiazem. She will eventually need an ablation she's had to be cardioverted twice at this point did give her the name of the cardiologist in [place] that Dr. Horace Porteous recommended.    Asthma, moderate persistant - Will start Pulmicort daily.  I do want her to try to hold off on her. Overall if possible because it definitely could flip her back into her atrial flutter. And the right perception for Xopenex that way if she absolutely needs to use her inhaler she is at least using something that's less likely to increase her heart rate.  Bronchitis - she is getting a little bit better. Though she did report having a low-grade temperature 99.4 last night. Certainly if she gets worse please give [place] a call in the next day or 2. I did go ahead and send over 5 days of prednisone to also help with the asthma in addition to the  bronchitis.  HTN- BP borderline today. Will monitor.

## 2017-06-07 DIAGNOSIS — N200 Calculus of kidney: Secondary | ICD-10-CM | POA: Diagnosis not present

## 2017-06-08 ENCOUNTER — Ambulatory Visit: Payer: Medicare Other | Admitting: Physician Assistant

## 2017-06-08 ENCOUNTER — Ambulatory Visit (HOSPITAL_COMMUNITY)
Admission: RE | Admit: 2017-06-08 | Discharge: 2017-06-08 | Disposition: A | Discharge status: Home / Self Care | Payer: Medicare Other | Source: Ambulatory Visit | Attending: Nurse Practitioner | Admitting: Nurse Practitioner

## 2017-06-08 ENCOUNTER — Encounter (HOSPITAL_COMMUNITY): Payer: Self-pay | Admitting: Nurse Practitioner

## 2017-06-08 VITALS — BP 140/76 | HR 77 | Ht 64.0 in | Wt 285.0 lb

## 2017-06-08 DIAGNOSIS — Z96653 Presence of artificial knee joint, bilateral: Secondary | ICD-10-CM | POA: Insufficient documentation

## 2017-06-08 DIAGNOSIS — Z79899 Other long term (current) drug therapy: Secondary | ICD-10-CM | POA: Insufficient documentation

## 2017-06-08 DIAGNOSIS — E669 Obesity, unspecified: Secondary | ICD-10-CM | POA: Diagnosis not present

## 2017-06-08 DIAGNOSIS — I480 Paroxysmal atrial fibrillation: Secondary | ICD-10-CM | POA: Diagnosis not present

## 2017-06-08 DIAGNOSIS — Z6841 Body Mass Index (BMI) 40.0 and over, adult: Secondary | ICD-10-CM | POA: Diagnosis not present

## 2017-06-08 DIAGNOSIS — Z882 Allergy status to sulfonamides status: Secondary | ICD-10-CM | POA: Insufficient documentation

## 2017-06-08 DIAGNOSIS — Z7901 Long term (current) use of anticoagulants: Secondary | ICD-10-CM | POA: Diagnosis not present

## 2017-06-08 DIAGNOSIS — Z9981 Dependence on supplemental oxygen: Secondary | ICD-10-CM | POA: Insufficient documentation

## 2017-06-08 DIAGNOSIS — Z8673 Personal history of transient ischemic attack (TIA), and cerebral infarction without residual deficits: Secondary | ICD-10-CM | POA: Diagnosis not present

## 2017-06-08 DIAGNOSIS — K219 Gastro-esophageal reflux disease without esophagitis: Secondary | ICD-10-CM | POA: Diagnosis not present

## 2017-06-08 DIAGNOSIS — I10 Essential (primary) hypertension: Secondary | ICD-10-CM | POA: Insufficient documentation

## 2017-06-08 DIAGNOSIS — Z7952 Long term (current) use of systemic steroids: Secondary | ICD-10-CM | POA: Diagnosis not present

## 2017-06-08 DIAGNOSIS — E785 Hyperlipidemia, unspecified: Secondary | ICD-10-CM | POA: Diagnosis not present

## 2017-06-08 DIAGNOSIS — M479 Spondylosis, unspecified: Secondary | ICD-10-CM | POA: Diagnosis not present

## 2017-06-08 DIAGNOSIS — Z885 Allergy status to narcotic agent status: Secondary | ICD-10-CM | POA: Diagnosis not present

## 2017-06-08 DIAGNOSIS — G4733 Obstructive sleep apnea (adult) (pediatric): Secondary | ICD-10-CM | POA: Insufficient documentation

## 2017-06-08 DIAGNOSIS — I4891 Unspecified atrial fibrillation: Secondary | ICD-10-CM | POA: Diagnosis present

## 2017-06-11 NOTE — Progress Notes (Signed)
Primary Care Physician: Hali Marry, MD Referring Physician: Dr. Curt Bears, [facility] f/u   Alison Richards is a 66 y.o. female with a h/o  obesity, OSA on CPAP, hypertension, prior TIA, and arthritis. She was admitted last week for new onset atrial flutter that terminated with verapamil. She underwent coronary CTA that admission with a calcium score of 0. Plans were for outpatient EP evaluation, but she went into recurrent atrial flutter the day of admission and presented to the ER for further evaluation. She was placed on Diltiazem drip and has since converted to SR. She is currently feeling much improved. She is symptomatic with atrial flutter with palpitations, shortness of breath, and "tingling" in her arms and neck. She did have acute shortness of breath yesterday afternoon after converting to SR that improved with Lasix. Echo 02-20-16 demonstrated EF 60-65%, no RWMA, LA 33. D dimer was elevated this admission, CT negative for PE. WBC has also been elevated since recent admission. No fevers or chills. She is planning to move to [place] this weekend to live with her son and daughter in law. Her husband passed away in 02-20-23. She was seen by Dr Curt Bears and started on Flecainide 50mg  twice daily in conjunction with Diltiazem. I have given her the name of Dr Tomie China in [place] to follow up for possible AF/flutter ablation in the future.   She is in the  AF clinic today for f/u prior to moving to [facility]. She has been staying in [place]. She is staying on eliquis 5 mg bid for chadsvasc score of 5.  Today, she denies symptoms of palpitations, chest pain, shortness of breath, orthopnea, PND, lower extremity edema, dizziness, presyncope, syncope, or neurologic sequela. The patient is tolerating medications without difficulties and is otherwise without complaint today.   Past Medical History:  Diagnosis Date  . BMI 50.0-59.9, adult ([place])   . Degenerative spinal arthritis    . Dependence on nocturnal oxygen therapy    used in addition to cpap  for hypoxmia 2 liters at hs  . GERD (gastroesophageal reflux disease)    PAST HX  H-PYLORI   . H/O hiatal hernia   . History of Helicobacter pylori infection   . History of transient ischemic attack (TIA) 12/31/2015  . Hyperlipemia   . Hypertension   . Left ureteral stone   . Moderate persistent asthma   . Nephrolithiasis    bilateral non-obstructive per ct 05-23-2016  . OA (osteoarthritis)   . Orthostatic lightheadedness   . OSA on CPAP    setting 8  . PONV (postoperative nausea and vomiting)   . Wears dentures    full top  . Wears glasses    Past Surgical History:  Procedure Laterality Date  . ABDOMINAL HYSTERECTOMY  1999   complete  . ANTERIOR CERVICAL DECOMP/DISCECTOMY FUSION  06/04/2001   C4 -- C6  . BLADDER SURGERY  1995  . CARPAL TUNNEL RELEASE Bilateral left 06-28-2009;  right 11-28-2009  . CESAREAN SECTION  1979   w/ Bilateral Tubal Ligation  . COLONOSCOPY    . CYSTOSCOPY WITH STENT PLACEMENT Left 09/01/2016   Procedure: CYSTOSCOPY WITH STENT PLACEMENT;  Surgeon: Franchot Gallo, MD;  [facility];  Service: Urology;  Laterality: Left;  . CYSTOSCOPY/RETROGRADE/URETEROSCOPY/STONE EXTRACTION WITH BASKET Left 09/01/2016   Procedure: CYSTOSCOPY/RETROGRADE/URETEROSCOPY/STONE EXTRACTION WITH BASKET;  Surgeon: Franchot Gallo, MD;  [facility];  Service: Urology;  Laterality: Left;  . ESOPHAGEAL MANOMETRY  09/02/2012   Procedure: ESOPHAGEAL MANOMETRY (EM);  Surgeon: Sable Feil, MD;  Location: [facility];  Service: Endoscopy;  Laterality: N/A;  . HOLMIUM LASER APPLICATION Left 96/11/9526   Procedure: HOLMIUM LASER APPLICATION;  Surgeon: Franchot Gallo, MD;  [facility];  Service: Urology;  Laterality: Left;  . LAPAROSCOPIC CHOLECYSTECTOMY  01/15/2007  . TONSILLECTOMY  age 77  . TOTAL KNEE ARTHROPLASTY  11/14/2011   Procedure: TOTAL KNEE ARTHROPLASTY;  Surgeon: Mauri Pole,  MD;  [facility];  Service: Orthopedics;  Laterality: Right;  combined with general block  . TOTAL KNEE ARTHROPLASTY Left 01/21/2007  . TRIGGER FINGER RELEASE Right 12/25/2013   Procedure: RIGHT LONG TRIGGER RELEASE ;  Surgeon: Tennis Must, MD;  Location: [facility];  Service: Orthopedics;  Laterality: Right;    Current Outpatient Prescriptions  Medication Sig Dispense Refill  . Aliskiren-Hydrochlorothiazide (TEKTURNA HCT) 150-25 MG TABS Take 1 Dose by mouth daily. (Patient taking differently: Take 2 tablets by mouth daily. ) 30 each 0  . AMBULATORY NON FORMULARY MEDICATION Medication Name: Nocturnal oxygen 2 liters. (Patient taking differently: Medication Name: Nocturnal oxygen 2 liters at bedtime.) 1 vial 0  . apixaban (ELIQUIS) 5 MG TABS tablet Take 1 tablet (5 mg total) by mouth 2 (two) times daily. 60 tablet 0  . benzonatate (TESSALON) 200 MG capsule Take 1 capsule (200 mg total) by mouth 3 (three) times daily as needed for cough. 30 capsule 0  . budesonide (PULMICORT FLEXHALER) 180 MCG/ACT inhaler Inhale 2 puffs into the lungs 2 (two) times daily. 1 Inhaler 1  . diltiazem (CARDIZEM CD) 180 MG 24 hr capsule Take 1 capsule (180 mg total) by mouth at bedtime. 30 capsule 0  . flecainide (TAMBOCOR) 50 MG tablet Take 1 tablet (50 mg total) by mouth every 12 (twelve) hours. 60 tablet 1  . levalbuterol (XOPENEX HFA) 45 MCG/ACT inhaler Inhale 2 puffs into the lungs every 6 (six) hours as needed for wheezing. 1 Inhaler 3  . predniSONE (DELTASONE) 20 MG tablet Take 2 tablets (40 mg total) by mouth daily. 10 tablet 0  . naproxen sodium (ANAPROX) 220 MG tablet Take 220-440 mg by mouth 2 (two) times daily as needed (for pain).     No current facility-administered medications for this encounter.     Allergies  Allergen Reactions  . Acetaminophen Other (See Comments)    AVOID ANY MEDS WITH ANY TRACE AMOUNTS hallucinations, cold sweats, headache, metallic taste, tongue swelling    . Dilaudid [Hydromorphone Hcl] Anaphylaxis and Swelling    Throat closing up  . Hydromorphone Anaphylaxis and Shortness Of Breath    Pt had a tightening in her throat  . Angiotensin Receptor Blockers Other (See Comments)    Joint aches  . Erythromycin Ethylsuccinate Diarrhea    gi symptoms, stomach swelling  . Lisinopril Other (See Comments)    ASTHMA LIKE SYMPTOMS, coughing  . Losartan Potassium-Hctz     REACTION: JOINTS aching, hurting all over  . Morphine Sulfate Nausea And Vomiting    Projectile vomiting  . Valsartan     REACTION: Hurting all over  . Latex Itching and Rash    SENSITIVE TO SOME ADHESIVES.  . Sulfa Antibiotics Swelling and Rash    Eyes swelled shut  . Sulfonamide Derivatives Swelling and Rash  . Tape Rash    Red and bruising, Please use "paper" tape only    Social History   Social History  . Marital status: Widowed    Spouse name: Herbie Baltimore  . Number of children: 3  .  Years of education: N/A   Occupational History  . LAB [employer]   Social History Main Topics  . Smoking status: Never Smoker  . Smokeless tobacco: Never Used  . Alcohol use No  . Drug use: No  . Sexual activity: Yes    Partners: Male   Other Topics Concern  . Not on file   Social History Narrative   Daily caffeine     Family History  Problem Relation Age of Onset  . Alzheimer's disease Mother 30  . Hypertension Mother   . Hyperlipidemia Mother   . Diverticulitis Mother   . Alcohol abuse Father   . Cancer Father   . COPD Father   . Lung cancer Father   . Skin cancer Father   . Prostate cancer Father   . Colon cancer Neg Hx     ROS- All systems are reviewed and negative except as per the HPI above  Physical Exam: Vitals:   06/08/17 1009  BP: 140/76  Pulse: 77  SpO2: 96%  Weight: 285 lb (129.3 kg)  Height: 5\' 4"  (1.626 m)   Wt Readings from Last 3 Encounters:  06/08/17 285 lb (129.3 kg)  06/06/17 284 lb (128.8 kg)  06/03/17 286 lb 14.4 oz  (130.1 kg)    Labs: Lab Results  Component Value Date   NA 141 06/04/2017   K 3.6 06/04/2017   CL 103 06/04/2017   CO2 29 06/04/2017   GLUCOSE 122 (H) 06/04/2017   BUN 16 06/04/2017   CREATININE 1.06 (H) 06/04/2017   CALCIUM 9.1 06/04/2017   MG 1.8 05/29/2017   Lab Results  Component Value Date   INR 1.34 06/04/2017   Lab Results  Component Value Date   CHOL 178 01/01/2016   HDL 42 01/01/2016   LDLCALC 108 (H) 01/01/2016   TRIG 142 01/01/2016     GEN- The patient is well appearing, alert and oriented x 3 today.   Head- normocephalic, atraumatic Eyes-  Sclera clear, conjunctiva pink Ears- hearing intact Oropharynx- clear Neck- supple, no JVP Lymph- no cervical lymphadenopathy Lungs- Clear to ausculation bilaterally, normal work of breathing Heart- Regular rate and rhythm, no murmurs, rubs or gallops, PMI not laterally displaced GI- soft, NT, ND, + BS Extremities- no clubbing, cyanosis, or edema MS- no significant deformity or atrophy Skin- no rash or lesion Psych- euthymic mood, full affect Neuro- strength and sensation are intact  EKG- NSR at 77 bpm, IRBBB, Pr int 180 ms, qrs int 98 ms, qtc 457 ms Epic records reviewed    Assessment and Plan: 1. New onset afib Appears to be staying in SR on flecainide General education re afib  Continue with eliquis with chadsvasc score of 5 General precautions re anticoagulation  F/u with afib clinic when she is back in town 8/22 at pt's request, other wise will be establishing with EP in [place]. Icy Fuhrmann, [facility] [address] [place], [place] [address] [phone]

## 2017-06-21 ENCOUNTER — Encounter (HOSPITAL_COMMUNITY): Payer: Self-pay | Admitting: Nurse Practitioner

## 2017-06-21 ENCOUNTER — Ambulatory Visit (HOSPITAL_COMMUNITY)
Admission: RE | Admit: 2017-06-21 | Discharge: 2017-06-21 | Disposition: A | Discharge status: Home / Self Care | Payer: Medicare Other | Source: Ambulatory Visit | Attending: Nurse Practitioner | Admitting: Nurse Practitioner

## 2017-06-21 VITALS — BP 122/76 | HR 80 | Ht 64.0 in | Wt 284.6 lb

## 2017-06-21 DIAGNOSIS — Z881 Allergy status to other antibiotic agents status: Secondary | ICD-10-CM | POA: Insufficient documentation

## 2017-06-21 DIAGNOSIS — J4540 Moderate persistent asthma, uncomplicated: Secondary | ICD-10-CM | POA: Diagnosis not present

## 2017-06-21 DIAGNOSIS — Z8673 Personal history of transient ischemic attack (TIA), and cerebral infarction without residual deficits: Secondary | ICD-10-CM | POA: Insufficient documentation

## 2017-06-21 DIAGNOSIS — Z811 Family history of alcohol abuse and dependence: Secondary | ICD-10-CM | POA: Insufficient documentation

## 2017-06-21 DIAGNOSIS — Z8249 Family history of ischemic heart disease and other diseases of the circulatory system: Secondary | ICD-10-CM | POA: Insufficient documentation

## 2017-06-21 DIAGNOSIS — Z836 Family history of other diseases of the respiratory system: Secondary | ICD-10-CM | POA: Insufficient documentation

## 2017-06-21 DIAGNOSIS — E669 Obesity, unspecified: Secondary | ICD-10-CM | POA: Insufficient documentation

## 2017-06-21 DIAGNOSIS — Z9109 Other allergy status, other than to drugs and biological substances: Secondary | ICD-10-CM | POA: Diagnosis not present

## 2017-06-21 DIAGNOSIS — Z9049 Acquired absence of other specified parts of digestive tract: Secondary | ICD-10-CM | POA: Diagnosis not present

## 2017-06-21 DIAGNOSIS — I10 Essential (primary) hypertension: Secondary | ICD-10-CM | POA: Diagnosis not present

## 2017-06-21 DIAGNOSIS — Z885 Allergy status to narcotic agent status: Secondary | ICD-10-CM | POA: Insufficient documentation

## 2017-06-21 DIAGNOSIS — Z87442 Personal history of urinary calculi: Secondary | ICD-10-CM | POA: Insufficient documentation

## 2017-06-21 DIAGNOSIS — Z90710 Acquired absence of both cervix and uterus: Secondary | ICD-10-CM | POA: Insufficient documentation

## 2017-06-21 DIAGNOSIS — Z91040 Latex allergy status: Secondary | ICD-10-CM | POA: Insufficient documentation

## 2017-06-21 DIAGNOSIS — Z808 Family history of malignant neoplasm of other organs or systems: Secondary | ICD-10-CM | POA: Insufficient documentation

## 2017-06-21 DIAGNOSIS — G4733 Obstructive sleep apnea (adult) (pediatric): Secondary | ICD-10-CM | POA: Insufficient documentation

## 2017-06-21 DIAGNOSIS — E785 Hyperlipidemia, unspecified: Secondary | ICD-10-CM | POA: Diagnosis not present

## 2017-06-21 DIAGNOSIS — Z981 Arthrodesis status: Secondary | ICD-10-CM | POA: Diagnosis not present

## 2017-06-21 DIAGNOSIS — Z7902 Long term (current) use of antithrombotics/antiplatelets: Secondary | ICD-10-CM | POA: Diagnosis not present

## 2017-06-21 DIAGNOSIS — Z8042 Family history of malignant neoplasm of prostate: Secondary | ICD-10-CM | POA: Insufficient documentation

## 2017-06-21 DIAGNOSIS — Z96653 Presence of artificial knee joint, bilateral: Secondary | ICD-10-CM | POA: Diagnosis not present

## 2017-06-21 DIAGNOSIS — Z888 Allergy status to other drugs, medicaments and biological substances status: Secondary | ICD-10-CM | POA: Insufficient documentation

## 2017-06-21 DIAGNOSIS — I4891 Unspecified atrial fibrillation: Secondary | ICD-10-CM | POA: Insufficient documentation

## 2017-06-21 DIAGNOSIS — Z820 Family history of epilepsy and other diseases of the nervous system: Secondary | ICD-10-CM | POA: Insufficient documentation

## 2017-06-21 DIAGNOSIS — Z6841 Body Mass Index (BMI) 40.0 and over, adult: Secondary | ICD-10-CM | POA: Insufficient documentation

## 2017-06-21 DIAGNOSIS — Z809 Family history of malignant neoplasm, unspecified: Secondary | ICD-10-CM | POA: Insufficient documentation

## 2017-06-21 DIAGNOSIS — Z882 Allergy status to sulfonamides status: Secondary | ICD-10-CM | POA: Diagnosis not present

## 2017-06-21 DIAGNOSIS — Z8619 Personal history of other infectious and parasitic diseases: Secondary | ICD-10-CM | POA: Insufficient documentation

## 2017-06-21 DIAGNOSIS — Z9981 Dependence on supplemental oxygen: Secondary | ICD-10-CM | POA: Diagnosis not present

## 2017-06-21 DIAGNOSIS — I480 Paroxysmal atrial fibrillation: Secondary | ICD-10-CM

## 2017-06-21 DIAGNOSIS — Z801 Family history of malignant neoplasm of trachea, bronchus and lung: Secondary | ICD-10-CM | POA: Insufficient documentation

## 2017-06-21 NOTE — Progress Notes (Signed)
Primary Care Physician: Hali Marry, MD Referring Physician: Dr. Curt Bears, [facility] f/u   Alison Richards is a 66 y.o. female with a h/o  obesity, OSA on CPAP, hypertension, prior TIA, and arthritis. She was admitted last week for new onset atrial flutter that terminated with verapamil. She underwent coronary CTA that admission with a calcium score of 0. Plans were for outpatient EP evaluation, but she went into recurrent atrial flutter the day of admission and presented to the ER for further evaluation. She was placed on Diltiazem drip and has since converted to SR. She is currently feeling much improved. She is symptomatic with atrial flutter with palpitations, shortness of breath, and "tingling" in her arms and neck. She did have acute shortness of breath yesterday afternoon after converting to SR that improved with Lasix. Echo Jan 31, 2016 demonstrated EF 60-65%, no RWMA, LA 33. D dimer was elevated this admission, CT negative for PE. WBC has also been elevated since recent admission. No fevers or chills. She is planning to move to [place] this weekend to live with her son and daughter in law. Her husband passed away in 2023-01-31. She was seen by Dr Curt Bears and started on Flecainide 50mg  twice daily in conjunction with Diltiazem.He gave  her the name of Dr Tomie China in [place] to follow up for possible AF/flutter ablation in the future.   8/6,  AF clinic today for f/u prior to moving to [facility]. She has been staying in [place]. She is staying on eliquis 5 mg bid for chadsvasc score of 5.  F/u afib clinic, 8/23. She has moved to [place] and has done well without any afib. She feels like she has some fluid intermittently. May be contributed to by cardizem but hesitate to lower dose as of yet, possibly can be lowered to 120 mg daily in the future.Weight today is stable, down one lb.   Today, she denies symptoms of palpitations, chest pain, shortness of breath, orthopnea,  PND, lower extremity edema, dizziness, presyncope, syncope, or neurologic sequela. The patient is tolerating medications without difficulties and is otherwise without complaint today.   Past Medical History:  Diagnosis Date  . BMI 50.0-59.9, adult ([place])   . Degenerative spinal arthritis   . Dependence on nocturnal oxygen therapy    used in addition to cpap  for hypoxmia 2 liters at hs  . GERD (gastroesophageal reflux disease)    PAST HX  H-PYLORI   . H/O hiatal hernia   . History of Helicobacter pylori infection   . History of transient ischemic attack (TIA) 12/31/2015  . Hyperlipemia   . Hypertension   . Left ureteral stone   . Moderate persistent asthma   . Nephrolithiasis    bilateral non-obstructive per ct 05-23-2016  . OA (osteoarthritis)   . Orthostatic lightheadedness   . OSA on CPAP    setting 8  . PONV (postoperative nausea and vomiting)   . Wears dentures    full top  . Wears glasses    Past Surgical History:  Procedure Laterality Date  . ABDOMINAL HYSTERECTOMY  1999   complete  . ANTERIOR CERVICAL DECOMP/DISCECTOMY FUSION  06/04/2001   C4 -- C6  . BLADDER SURGERY  1995  . CARPAL TUNNEL RELEASE Bilateral left 06-28-2009;  right 11-28-2009  . CESAREAN SECTION  1979   w/ Bilateral Tubal Ligation  . COLONOSCOPY    . CYSTOSCOPY WITH STENT PLACEMENT Left 09/01/2016   Procedure: CYSTOSCOPY WITH STENT PLACEMENT;  Surgeon: Franchot Gallo, MD;  [facility];  Service: Urology;  Laterality: Left;  . CYSTOSCOPY/RETROGRADE/URETEROSCOPY/STONE EXTRACTION WITH BASKET Left 09/01/2016   Procedure: CYSTOSCOPY/RETROGRADE/URETEROSCOPY/STONE EXTRACTION WITH BASKET;  Surgeon: Franchot Gallo, MD;  [facility];  Service: Urology;  Laterality: Left;  . ESOPHAGEAL MANOMETRY  09/02/2012   Procedure: ESOPHAGEAL MANOMETRY (EM);  Surgeon: Sable Feil, MD;  [facility];  Service: Endoscopy;  Laterality: N/A;  . HOLMIUM LASER APPLICATION Left 73/12/2023    Procedure: HOLMIUM LASER APPLICATION;  Surgeon: Franchot Gallo, MD;  [facility];  Service: Urology;  Laterality: Left;  . LAPAROSCOPIC CHOLECYSTECTOMY  01/15/2007  . TONSILLECTOMY  age 62  . TOTAL KNEE ARTHROPLASTY  11/14/2011   Procedure: TOTAL KNEE ARTHROPLASTY;  Surgeon: Mauri Pole, MD;  [facility];  Service: Orthopedics;  Laterality: Right;  combined with general block  . TOTAL KNEE ARTHROPLASTY Left 01/21/2007  . TRIGGER FINGER RELEASE Right 12/25/2013   Procedure: RIGHT LONG TRIGGER RELEASE ;  Surgeon: Tennis Must, MD;  Location: [place];  Service: Orthopedics;  Laterality: Right;    Current Outpatient Prescriptions  Medication Sig Dispense Refill  . Aliskiren-Hydrochlorothiazide (TEKTURNA HCT) 150-25 MG TABS Take 1 Dose by mouth daily. (Patient taking differently: Take 2 tablets by mouth daily. ) 30 each 0  . AMBULATORY NON FORMULARY MEDICATION Medication Name: Nocturnal oxygen 2 liters. (Patient taking differently: Medication Name: Nocturnal oxygen 2 liters at bedtime.) 1 vial 0  . apixaban (ELIQUIS) 5 MG TABS tablet Take 1 tablet (5 mg total) by mouth 2 (two) times daily. 60 tablet 0  . budesonide (PULMICORT FLEXHALER) 180 MCG/ACT inhaler Inhale 2 puffs into the lungs 2 (two) times daily. 1 Inhaler 1  . diltiazem (CARDIZEM CD) 180 MG 24 hr capsule Take 1 capsule (180 mg total) by mouth at bedtime. 30 capsule 0  . flecainide (TAMBOCOR) 50 MG tablet Take 1 tablet (50 mg total) by mouth every 12 (twelve) hours. 60 tablet 1  . levalbuterol (XOPENEX HFA) 45 MCG/ACT inhaler Inhale 2 puffs into the lungs every 6 (six) hours as needed for wheezing. 1 Inhaler 3  . naproxen sodium (ANAPROX) 220 MG tablet Take 220-440 mg by mouth 2 (two) times daily as needed (for pain).    . benzonatate (TESSALON) 200 MG capsule Take 1 capsule (200 mg total) by mouth 3 (three) times daily as needed for cough. (Patient not taking: Reported on 06/21/2017) 30 capsule 0   No  current facility-administered medications for this encounter.     Allergies  Allergen Reactions  . Acetaminophen Other (See Comments)    AVOID ANY MEDS WITH ANY TRACE AMOUNTS hallucinations, cold sweats, headache, metallic taste, tongue swelling  . Dilaudid [Hydromorphone Hcl] Anaphylaxis and Swelling    Throat closing up  . Hydromorphone Anaphylaxis and Shortness Of Breath    Pt had a tightening in her throat  . Angiotensin Receptor Blockers Other (See Comments)    Joint aches  . Erythromycin Ethylsuccinate Diarrhea    gi symptoms, stomach swelling  . Lisinopril Other (See Comments)    ASTHMA LIKE SYMPTOMS, coughing  . Losartan Potassium-Hctz     REACTION: JOINTS aching, hurting all over  . Morphine Sulfate Nausea And Vomiting    Projectile vomiting  . Valsartan     REACTION: Hurting all over  . Latex Itching and Rash    SENSITIVE TO SOME ADHESIVES.  . Sulfa Antibiotics Swelling and Rash    Eyes swelled shut  . Sulfonamide Derivatives Swelling and Rash  . Tape  Rash    Red and bruising, Please use "paper" tape only    Social History   Social History  . Marital status: Widowed    Spouse name: Herbie Baltimore  . Number of children: 3  . Years of education: N/A   Occupational History  . LAB [employer]   Social History Main Topics  . Smoking status: Never Smoker  . Smokeless tobacco: Never Used  . Alcohol use No  . Drug use: No  . Sexual activity: Yes    Partners: Male   Other Topics Concern  . Not on file   Social History Narrative   Daily caffeine     Family History  Problem Relation Age of Onset  . Alzheimer's disease Mother 48  . Hypertension Mother   . Hyperlipidemia Mother   . Diverticulitis Mother   . Alcohol abuse Father   . Cancer Father   . COPD Father   . Lung cancer Father   . Skin cancer Father   . Prostate cancer Father   . Colon cancer Neg Hx     ROS- All systems are reviewed and negative except as per the HPI  above  Physical Exam: Vitals:   06/21/17 0854  BP: 122/76  Pulse: 80  Weight: 284 lb 9.6 oz (129.1 kg)  Height: 5\' 4"  (1.626 m)   Wt Readings from Last 3 Encounters:  06/21/17 284 lb 9.6 oz (129.1 kg)  06/08/17 285 lb (129.3 kg)  06/06/17 284 lb (128.8 kg)    Labs: Lab Results  Component Value Date   NA 141 06/04/2017   K 3.6 06/04/2017   CL 103 06/04/2017   CO2 29 06/04/2017   GLUCOSE 122 (H) 06/04/2017   BUN 16 06/04/2017   CREATININE 1.06 (H) 06/04/2017   CALCIUM 9.1 06/04/2017   MG 1.8 05/29/2017   Lab Results  Component Value Date   INR 1.34 06/04/2017   Lab Results  Component Value Date   CHOL 178 01/01/2016   HDL 42 01/01/2016   LDLCALC 108 (H) 01/01/2016   TRIG 142 01/01/2016     GEN- The patient is well appearing, alert and oriented x 3 today.   Head- normocephalic, atraumatic Eyes-  Sclera clear, conjunctiva pink Ears- hearing intact Oropharynx- clear Neck- supple, no JVP Lymph- no cervical lymphadenopathy Lungs- Clear to ausculation bilaterally, normal work of breathing Heart- Regular rate and rhythm, no murmurs, rubs or gallops, PMI not laterally displaced GI- soft, NT, ND, + BS Extremities- no clubbing, cyanosis, or edema MS- no significant deformity or atrophy Skin- no rash or lesion Psych- euthymic mood, full affect Neuro- strength and sensation are intact  EKG- NSR at 80 bpm, IRBBB, LAD.  Pr int 188 ms, qrs int 98 ms, qtc 447 ms Epic records reviewed    Assessment and Plan: 1. New onset afib Appears to be staying in SR on flecainide Continue flecainide 50 mg bid Continue Cardizem 180 mg a day, possibly can be lowered to 120 mg in the future, if pt feels it is contributing to edema Continue with eliquis with chadsvasc score of 5  2. HTN Stable   F/u with afib clinic when she is back in town in one month  [place]. Wyley Hack, [facility] [address] [place], [place] [address] [phone]

## 2017-07-03 ENCOUNTER — Other Ambulatory Visit (HOSPITAL_COMMUNITY): Payer: Self-pay | Admitting: *Deleted

## 2017-07-03 MED ORDER — FLECAINIDE ACETATE 50 MG PO TABS: 50.0000 mg | ORAL_TABLET | Freq: Two times a day (BID) | ORAL | 3 refills | Status: DC

## 2017-07-03 MED ORDER — DILTIAZEM HCL ER COATED BEADS 180 MG PO CP24: 180.0000 mg | ORAL_CAPSULE | Freq: Every day | ORAL | 3 refills | Status: DC

## 2017-07-03 MED ORDER — APIXABAN 5 MG PO TABS: 5.0000 mg | ORAL_TABLET | Freq: Two times a day (BID) | ORAL | 3 refills | Status: DC

## 2017-07-23 DIAGNOSIS — D2272 Melanocytic nevi of left lower limb, including hip: Secondary | ICD-10-CM | POA: Diagnosis not present

## 2017-07-23 DIAGNOSIS — L218 Other seborrheic dermatitis: Secondary | ICD-10-CM | POA: Diagnosis not present

## 2017-07-23 DIAGNOSIS — D225 Melanocytic nevi of trunk: Secondary | ICD-10-CM | POA: Diagnosis not present

## 2017-07-23 DIAGNOSIS — D2262 Melanocytic nevi of left upper limb, including shoulder: Secondary | ICD-10-CM | POA: Diagnosis not present

## 2017-07-23 DIAGNOSIS — L821 Other seborrheic keratosis: Secondary | ICD-10-CM | POA: Diagnosis not present

## 2017-07-23 DIAGNOSIS — L918 Other hypertrophic disorders of the skin: Secondary | ICD-10-CM | POA: Diagnosis not present

## 2017-07-23 DIAGNOSIS — D2271 Melanocytic nevi of right lower limb, including hip: Secondary | ICD-10-CM | POA: Diagnosis not present

## 2017-07-23 DIAGNOSIS — D2261 Melanocytic nevi of right upper limb, including shoulder: Secondary | ICD-10-CM | POA: Diagnosis not present

## 2017-07-24 ENCOUNTER — Ambulatory Visit (HOSPITAL_COMMUNITY)
Admission: RE | Admit: 2017-07-24 | Discharge: 2017-07-24 | Disposition: A | Discharge status: Home / Self Care | Payer: Medicare Other | Source: Ambulatory Visit | Attending: Nurse Practitioner | Admitting: Nurse Practitioner

## 2017-07-24 ENCOUNTER — Encounter (HOSPITAL_COMMUNITY): Payer: Self-pay | Admitting: Nurse Practitioner

## 2017-07-24 VITALS — BP 116/76 | HR 87 | Ht 64.0 in | Wt 283.8 lb

## 2017-07-24 DIAGNOSIS — J4540 Moderate persistent asthma, uncomplicated: Secondary | ICD-10-CM | POA: Diagnosis not present

## 2017-07-24 DIAGNOSIS — E785 Hyperlipidemia, unspecified: Secondary | ICD-10-CM | POA: Diagnosis not present

## 2017-07-24 DIAGNOSIS — Z9981 Dependence on supplemental oxygen: Secondary | ICD-10-CM | POA: Diagnosis not present

## 2017-07-24 DIAGNOSIS — E669 Obesity, unspecified: Secondary | ICD-10-CM | POA: Diagnosis not present

## 2017-07-24 DIAGNOSIS — K219 Gastro-esophageal reflux disease without esophagitis: Secondary | ICD-10-CM | POA: Insufficient documentation

## 2017-07-24 DIAGNOSIS — I10 Essential (primary) hypertension: Secondary | ICD-10-CM | POA: Insufficient documentation

## 2017-07-24 DIAGNOSIS — Z8673 Personal history of transient ischemic attack (TIA), and cerebral infarction without residual deficits: Secondary | ICD-10-CM | POA: Insufficient documentation

## 2017-07-24 DIAGNOSIS — I4891 Unspecified atrial fibrillation: Secondary | ICD-10-CM | POA: Diagnosis not present

## 2017-07-24 DIAGNOSIS — M479 Spondylosis, unspecified: Secondary | ICD-10-CM | POA: Diagnosis not present

## 2017-07-24 DIAGNOSIS — I480 Paroxysmal atrial fibrillation: Secondary | ICD-10-CM

## 2017-07-24 DIAGNOSIS — G4733 Obstructive sleep apnea (adult) (pediatric): Secondary | ICD-10-CM | POA: Diagnosis not present

## 2017-07-24 DIAGNOSIS — Z6841 Body Mass Index (BMI) 40.0 and over, adult: Secondary | ICD-10-CM | POA: Insufficient documentation

## 2017-07-24 LAB — BASIC METABOLIC PANEL
ANION GAP: 6 (ref 5–15)
BUN: 17 mg/dL (ref 6–20)
CALCIUM: 9.2 mg/dL (ref 8.9–10.3)
CO2: 31 mmol/L (ref 22–32)
Chloride: 103 mmol/L (ref 101–111)
Creatinine, Ser: 1.05 mg/dL — ABNORMAL HIGH (ref 0.44–1.00)
GFR calc non Af Amer: 54 mL/min — ABNORMAL LOW (ref >60)
Glucose, Bld: 128 mg/dL — ABNORMAL HIGH (ref 65–99)
Potassium: 3.3 mmol/L — ABNORMAL LOW (ref 3.5–5.1)
Sodium: 140 mmol/L (ref 135–145)

## 2017-07-24 LAB — MAGNESIUM: Magnesium: 1.7 mg/dL (ref 1.7–2.4)

## 2017-07-24 MED ORDER — DILTIAZEM HCL ER COATED BEADS 120 MG PO CP24: 120.0000 mg | ORAL_CAPSULE | Freq: Every day | ORAL | 6 refills | Status: AC

## 2017-07-24 MED ORDER — POTASSIUM CHLORIDE ER 20 MEQ PO TBCR: 20.0000 meq | EXTENDED_RELEASE_TABLET | Freq: Every day | ORAL | 3 refills | Status: DC

## 2017-07-24 MED ORDER — MAGNESIUM OXIDE 400 MG PO TABS: 200.0000 mg | ORAL_TABLET | Freq: Every day | ORAL | 6 refills | Status: AC

## 2017-07-24 NOTE — Progress Notes (Addendum)
Primary Care Physician: Hali Marry, MD Referring Physician: Dr. Curt Bears, [facility] f/u   Alison Richards is a 66 y.o. female with a h/o  obesity, OSA on CPAP, hypertension, prior TIA, and arthritis. She was admitted several weeks ago for new onset atrial flutter that terminated with verapamil. She underwent coronary CTA that admission with a calcium score of 0. Plans were for outpatient EP evaluation, but she went into recurrent atrial flutter the day of admission and presented to the ER for further evaluation. She was placed on Diltiazem drip and has since converted to SR. She is currently feeling much improved. She was symptomatic with atrial flutter with palpitations, shortness of breath, and "tingling" in her arms and neck. She did have acute shortness of breath  after converting to SR that improved with Lasix. Echo February 13, 2016 demonstrated EF 60-65%, no RWMA, LA 33. D dimer was elevated this admission, CT negative for PE. WBC has also been elevated since recent admission. No fevers or chills. She has now moved to [place]  to live with her son and daughter in law, but has not sold her house yet and still has to come back to [place] for this. Her husband passed away in 02-13-2023. She was seen by Dr Curt Bears and started on Flecainide 50mg  twice daily in conjunction with Diltiazem.He gave  her the name of Dr Tomie China in [place] to follow up for possible AF/flutter ablation in the future.   8/6,  AF clinic today for f/u prior to moving to [facility]. She has been staying in [place]. She is staying on eliquis 5 mg bid for chadsvasc score of 5.  F/u afib clinic, 8/23. She has done well without any afib. She feels like she has some fluid intermittently. May be contributed to by cardizem but hesitate to lower dose as of yet, possibly can be lowered to 120 mg daily in the future.Weight today is stable, down one lb.  F/u in afib clinic, 9/25, no afib but does not feel as well the  last few days. Qtc looks long and reviewed with Dr. Curt Bears, he advises to stop flecainide.   Today, she denies symptoms of palpitations, chest pain, shortness of breath, orthopnea, PND, lower extremity edema, dizziness, presyncope, syncope, or neurologic sequela.Positive for fatigue. The patient is tolerating medications without difficulties and is otherwise without complaint today.   Past Medical History:  Diagnosis Date  . BMI 50.0-59.9, adult ([place])   . Degenerative spinal arthritis   . Dependence on nocturnal oxygen therapy    used in addition to cpap  for hypoxmia 2 liters at hs  . GERD (gastroesophageal reflux disease)    PAST HX  H-PYLORI   . H/O hiatal hernia   . History of Helicobacter pylori infection   . History of transient ischemic attack (TIA) 12/31/2015  . Hyperlipemia   . Hypertension   . Left ureteral stone   . Moderate persistent asthma   . Nephrolithiasis    bilateral non-obstructive per ct 05-23-2016  . OA (osteoarthritis)   . Orthostatic lightheadedness   . OSA on CPAP    setting 8  . PONV (postoperative nausea and vomiting)   . Wears dentures    full top  . Wears glasses    Past Surgical History:  Procedure Laterality Date  . ABDOMINAL HYSTERECTOMY  1999   complete  . ANTERIOR CERVICAL DECOMP/DISCECTOMY FUSION  06/04/2001   C4 -- C6  . BLADDER SURGERY  1995  . CARPAL TUNNEL RELEASE  Bilateral left 06-28-2009;  right 11-28-2009  . CESAREAN SECTION  1979   w/ Bilateral Tubal Ligation  . COLONOSCOPY    . CYSTOSCOPY WITH STENT PLACEMENT Left 09/01/2016   Procedure: CYSTOSCOPY WITH STENT PLACEMENT;  Surgeon: Franchot Gallo, MD;  [facility];  Service: Urology;  Laterality: Left;  . CYSTOSCOPY/RETROGRADE/URETEROSCOPY/STONE EXTRACTION WITH BASKET Left 09/01/2016   Procedure: CYSTOSCOPY/RETROGRADE/URETEROSCOPY/STONE EXTRACTION WITH BASKET;  Surgeon: Franchot Gallo, MD;  [facility];  Service: Urology;  Laterality: Left;  . ESOPHAGEAL MANOMETRY   09/02/2012   Procedure: ESOPHAGEAL MANOMETRY (EM);  Surgeon: Sable Feil, MD;  [facility];  Service: Endoscopy;  Laterality: N/A;  . HOLMIUM LASER APPLICATION Left 16/10/958   Procedure: HOLMIUM LASER APPLICATION;  Surgeon: Franchot Gallo, MD;  [facility];  Service: Urology;  Laterality: Left;  . LAPAROSCOPIC CHOLECYSTECTOMY  01/15/2007  . TONSILLECTOMY  age 37  . TOTAL KNEE ARTHROPLASTY  11/14/2011   Procedure: TOTAL KNEE ARTHROPLASTY;  Surgeon: Mauri Pole, MD;  [facility];  Service: Orthopedics;  Laterality: Right;  combined with general block  . TOTAL KNEE ARTHROPLASTY Left 01/21/2007  . TRIGGER FINGER RELEASE Right 12/25/2013   Procedure: RIGHT LONG TRIGGER RELEASE ;  Surgeon: Tennis Must, MD;  Location: [place];  Service: Orthopedics;  Laterality: Right;    Current Outpatient Prescriptions  Medication Sig Dispense Refill  . Aliskiren-Hydrochlorothiazide (TEKTURNA HCT) 150-25 MG TABS Take 1 Dose by mouth daily. (Patient taking differently: Take 1 tablet by mouth daily. ) 30 each 0  . AMBULATORY NON FORMULARY MEDICATION Medication Name: Nocturnal oxygen 2 liters. (Patient taking differently: Medication Name: Nocturnal oxygen 2 liters at bedtime.) 1 vial 0  . apixaban (ELIQUIS) 5 MG TABS tablet Take 1 tablet (5 mg total) by mouth 2 (two) times daily. 60 tablet 3  . budesonide (PULMICORT FLEXHALER) 180 MCG/ACT inhaler Inhale 2 puffs into the lungs 2 (two) times daily. 1 Inhaler 1  . diltiazem (CARDIZEM CD) 120 MG 24 hr capsule Take 1 capsule (120 mg total) by mouth at bedtime. 30 capsule 6  . flecainide (TAMBOCOR) 50 MG tablet Take 1 tablet (50 mg total) by mouth every 12 (twelve) hours. 60 tablet 3  . levalbuterol (XOPENEX HFA) 45 MCG/ACT inhaler Inhale 2 puffs into the lungs every 6 (six) hours as needed for wheezing. 1 Inhaler 3  . naproxen sodium (ANAPROX) 220 MG tablet Take 220-440 mg by mouth 2 (two) times daily as needed (for pain).     . benzonatate (TESSALON) 200 MG capsule Take 1 capsule (200 mg total) by mouth 3 (three) times daily as needed for cough. (Patient not taking: Reported on 06/21/2017) 30 capsule 0   No current facility-administered medications for this encounter.     Allergies  Allergen Reactions  . Acetaminophen Other (See Comments)    AVOID ANY MEDS WITH ANY TRACE AMOUNTS hallucinations, cold sweats, headache, metallic taste, tongue swelling  . Dilaudid [Hydromorphone Hcl] Anaphylaxis and Swelling    Throat closing up  . Hydromorphone Anaphylaxis and Shortness Of Breath    Pt had a tightening in her throat  . Angiotensin Receptor Blockers Other (See Comments)    Joint aches  . Erythromycin Ethylsuccinate Diarrhea    gi symptoms, stomach swelling  . Lisinopril Other (See Comments)    ASTHMA LIKE SYMPTOMS, coughing  . Losartan Potassium-Hctz     REACTION: JOINTS aching, hurting all over  . Morphine Sulfate Nausea And Vomiting    Projectile vomiting  . Valsartan  REACTION: Hurting all over  . Latex Itching and Rash    SENSITIVE TO SOME ADHESIVES.  . Sulfa Antibiotics Swelling and Rash    Eyes swelled shut  . Sulfonamide Derivatives Swelling and Rash  . Tape Rash    Red and bruising, Please use "paper" tape only    Social History   Social History  . Marital status: Widowed    Spouse name: Herbie Baltimore  . Number of children: 3  . Years of education: N/A   Occupational History  . LAB [employer]   Social History Main Topics  . Smoking status: Never Smoker  . Smokeless tobacco: Never Used  . Alcohol use No  . Drug use: No  . Sexual activity: Yes    Partners: Male   Other Topics Concern  . Not on file   Social History Narrative   Daily caffeine     Family History  Problem Relation Age of Onset  . Alzheimer's disease Mother 67  . Hypertension Mother   . Hyperlipidemia Mother   . Diverticulitis Mother   . Alcohol abuse Father   . Cancer Father   . COPD Father    . Lung cancer Father   . Skin cancer Father   . Prostate cancer Father   . Colon cancer Neg Hx     ROS- All systems are reviewed and negative except as per the HPI above  Physical Exam: Vitals:   07/24/17 1400  BP: 116/76  Pulse: 87  Weight: 283 lb 12.8 oz (128.7 kg)  Height: 5\' 4"  (1.626 m)   Wt Readings from Last 3 Encounters:  07/24/17 283 lb 12.8 oz (128.7 kg)  06/21/17 284 lb 9.6 oz (129.1 kg)  06/08/17 285 lb (129.3 kg)    Labs: Lab Results  Component Value Date   NA 140 07/24/2017   K 3.3 (L) 07/24/2017   CL 103 07/24/2017   CO2 31 07/24/2017   GLUCOSE 128 (H) 07/24/2017   BUN 17 07/24/2017   CREATININE 1.05 (H) 07/24/2017   CALCIUM 9.2 07/24/2017   MG 1.7 07/24/2017   Lab Results  Component Value Date   INR 1.34 06/04/2017   Lab Results  Component Value Date   CHOL 178 01/01/2016   HDL 42 01/01/2016   LDLCALC 108 (H) 01/01/2016   TRIG 142 01/01/2016     GEN- The patient is well appearing, alert and oriented x 3 today.   Head- normocephalic, atraumatic Eyes-  Sclera clear, conjunctiva pink Ears- hearing intact Oropharynx- clear Neck- supple, no JVP Lymph- no cervical lymphadenopathy Lungs- Clear to ausculation bilaterally, normal work of breathing Heart- Regular rate and rhythm, no murmurs, rubs or gallops, PMI not laterally displaced GI- soft, NT, ND, + BS Extremities- no clubbing, cyanosis, or edema MS- no significant deformity or atrophy Skin- no rash or lesion Psych- euthymic mood, full affect Neuro- strength and sensation are intact  EKG- NSR at 87 bpm, IRBBB, LAD.  Pr int 188 ms, qrs int 98 ms, qtc 592 ms Epic records reviewed    Assessment and Plan: 1. New onset afib Appears to be staying in SR on flecainide qtr appears long today Stop flecainide 50 mg bid, per Dr. Curt Bears, he suggested Multaq after washout but this can too prolong QTc so will need close f/u with EKG if started Lower Cardizem 120 mg a day, as she feels that it  causes some edema Continue with eliquis with chadsvasc score of 5 Bmet/mag today shows Kt low at  3.3 and mag at 1.7, will start 20 meq k+ a day as well as 200 mg mag daily  2. HTN Stable   F/u with afib clinic when she is back in town on Friday, for repeat labs and EKG  Butch Penny C. Sanjna Haskew, [facility] [address] [place], [place] [address] [phone]

## 2017-07-24 NOTE — Progress Notes (Signed)
error 

## 2017-07-24 NOTE — Patient Instructions (Signed)
Your physician has recommended you make the following change in your medication:  1)decrease cardizem to 120mg  once a day  Alison Richards will be in touch regarding any changes to flecainide.

## 2017-07-25 ENCOUNTER — Ambulatory Visit (HOSPITAL_COMMUNITY): Payer: Medicare Other | Admitting: Nurse Practitioner

## 2017-07-26 ENCOUNTER — Other Ambulatory Visit (HOSPITAL_COMMUNITY): Payer: Self-pay | Admitting: Nurse Practitioner

## 2017-07-27 ENCOUNTER — Ambulatory Visit (HOSPITAL_COMMUNITY)
Admission: RE | Admit: 2017-07-27 | Discharge: 2017-07-27 | Disposition: A | Discharge status: Home / Self Care | Payer: Medicare Other | Source: Ambulatory Visit | Attending: Nurse Practitioner | Admitting: Nurse Practitioner

## 2017-07-27 ENCOUNTER — Other Ambulatory Visit (HOSPITAL_COMMUNITY): Payer: Self-pay | Admitting: *Deleted

## 2017-07-27 ENCOUNTER — Encounter (HOSPITAL_COMMUNITY): Payer: Self-pay | Admitting: Nurse Practitioner

## 2017-07-27 DIAGNOSIS — H527 Unspecified disorder of refraction: Secondary | ICD-10-CM | POA: Diagnosis not present

## 2017-07-27 DIAGNOSIS — H25813 Combined forms of age-related cataract, bilateral: Secondary | ICD-10-CM | POA: Diagnosis not present

## 2017-07-27 DIAGNOSIS — I481 Persistent atrial fibrillation: Secondary | ICD-10-CM | POA: Diagnosis not present

## 2017-07-27 DIAGNOSIS — I4510 Unspecified right bundle-branch block: Secondary | ICD-10-CM | POA: Diagnosis not present

## 2017-07-27 LAB — BASIC METABOLIC PANEL
ANION GAP: 8 (ref 5–15)
BUN: 15 mg/dL (ref 6–20)
CALCIUM: 9.5 mg/dL (ref 8.9–10.3)
CO2: 30 mmol/L (ref 22–32)
Chloride: 100 mmol/L — ABNORMAL LOW (ref 101–111)
Creatinine, Ser: 0.93 mg/dL (ref 0.44–1.00)
GFR calc Af Amer: >60 mL/min (ref >60)
GLUCOSE: 101 mg/dL — AB (ref 65–99)
Potassium: 3.8 mmol/L (ref 3.5–5.1)
Sodium: 138 mmol/L (ref 135–145)

## 2017-07-27 LAB — MAGNESIUM: Magnesium: 1.8 mg/dL (ref 1.7–2.4)

## 2017-07-27 MED ORDER — FLECAINIDE ACETATE 50 MG PO TABS: 25.0000 mg | ORAL_TABLET | Freq: Two times a day (BID) | ORAL | 3 refills | Status: AC

## 2017-07-27 NOTE — Progress Notes (Addendum)
Pt in for repeat EKG since stopping flecainide.  EKG to be reviewed by Roderic Palau, NP  Pt had prolonged qtc on last visit so flecainide was stopped. K+/mag on low side and repleted. QTC has returned to normal range today at 432 ms.. Discussed Multaq(can also prolong qt)but she hates to start a new drug. Possibly qt was long due to  Low K+, will try to add back on 25 mg flecainide bid and f/u with EKG in one week.

## 2017-08-03 ENCOUNTER — Ambulatory Visit (HOSPITAL_COMMUNITY)
Admission: RE | Admit: 2017-08-03 | Discharge: 2017-08-03 | Disposition: A | Discharge status: Home / Self Care | Payer: Medicare Other | Source: Ambulatory Visit | Attending: Nurse Practitioner | Admitting: Nurse Practitioner

## 2017-08-03 ENCOUNTER — Other Ambulatory Visit (HOSPITAL_COMMUNITY): Payer: Self-pay | Admitting: *Deleted

## 2017-08-03 ENCOUNTER — Encounter (HOSPITAL_COMMUNITY): Payer: Self-pay | Admitting: Nurse Practitioner

## 2017-08-03 DIAGNOSIS — Z79899 Other long term (current) drug therapy: Secondary | ICD-10-CM | POA: Diagnosis not present

## 2017-08-03 DIAGNOSIS — I4510 Unspecified right bundle-branch block: Secondary | ICD-10-CM | POA: Diagnosis not present

## 2017-08-03 DIAGNOSIS — I4891 Unspecified atrial fibrillation: Secondary | ICD-10-CM | POA: Insufficient documentation

## 2017-08-03 LAB — BASIC METABOLIC PANEL
Anion gap: 7 (ref 5–15)
BUN: 21 mg/dL — AB (ref 6–20)
CHLORIDE: 102 mmol/L (ref 101–111)
CO2: 29 mmol/L (ref 22–32)
CREATININE: 0.96 mg/dL (ref 0.44–1.00)
Calcium: 9.4 mg/dL (ref 8.9–10.3)
GFR calc Af Amer: >60 mL/min (ref >60)
GFR calc non Af Amer: >60 mL/min (ref >60)
GLUCOSE: 99 mg/dL (ref 65–99)
Potassium: 3.4 mmol/L — ABNORMAL LOW (ref 3.5–5.1)
SODIUM: 138 mmol/L (ref 135–145)

## 2017-08-03 MED ORDER — POTASSIUM CHLORIDE ER 10 MEQ PO TBCR: 10.0000 meq | EXTENDED_RELEASE_TABLET | Freq: Two times a day (BID) | ORAL | 6 refills | Status: AC

## 2017-08-03 NOTE — Progress Notes (Addendum)
Pt in for repeat EKG since lowering dose of flecainide to 25 mg bid and cardizem to 120 mg at bedtime.  EKG to be reviewed by Roderic Palau, NP  In for addition of flecainide at 25 mg bid after long qtc on 50 mg bid. Her K+ was also low at the time and was replaced. Qtc is acceptable at 469 ms Pr int 178 ms, qtc 469 ms. She will stay on this dose, will repeat bmet. She is establishing with cardiology in [place] the end of October and will been seen here as needed.

## 2017-08-14 DIAGNOSIS — N390 Urinary tract infection, site not specified: Secondary | ICD-10-CM | POA: Diagnosis not present

## 2017-08-29 DIAGNOSIS — Z1231 Encounter for screening mammogram for malignant neoplasm of breast: Secondary | ICD-10-CM | POA: Diagnosis not present

## 2017-08-29 DIAGNOSIS — I4892 Unspecified atrial flutter: Secondary | ICD-10-CM | POA: Diagnosis not present

## 2017-08-29 DIAGNOSIS — Z8673 Personal history of transient ischemic attack (TIA), and cerebral infarction without residual deficits: Secondary | ICD-10-CM | POA: Diagnosis not present

## 2017-08-29 DIAGNOSIS — I10 Essential (primary) hypertension: Secondary | ICD-10-CM | POA: Diagnosis not present

## 2017-08-29 DIAGNOSIS — Z6841 Body Mass Index (BMI) 40.0 and over, adult: Secondary | ICD-10-CM | POA: Diagnosis not present

## 2017-08-29 DIAGNOSIS — R319 Hematuria, unspecified: Secondary | ICD-10-CM | POA: Diagnosis not present

## 2017-08-29 DIAGNOSIS — R5382 Chronic fatigue, unspecified: Secondary | ICD-10-CM | POA: Diagnosis not present

## 2017-08-29 DIAGNOSIS — L570 Actinic keratosis: Secondary | ICD-10-CM | POA: Diagnosis not present

## 2017-08-29 DIAGNOSIS — Z23 Encounter for immunization: Secondary | ICD-10-CM | POA: Diagnosis not present

## 2017-08-29 DIAGNOSIS — E876 Hypokalemia: Secondary | ICD-10-CM | POA: Diagnosis not present

## 2017-08-29 DIAGNOSIS — N390 Urinary tract infection, site not specified: Secondary | ICD-10-CM | POA: Diagnosis not present

## 2017-09-13 DIAGNOSIS — I10 Essential (primary) hypertension: Secondary | ICD-10-CM | POA: Diagnosis not present

## 2017-09-13 DIAGNOSIS — R002 Palpitations: Secondary | ICD-10-CM | POA: Diagnosis not present

## 2017-09-13 DIAGNOSIS — I4892 Unspecified atrial flutter: Secondary | ICD-10-CM | POA: Diagnosis not present

## 2017-09-22 ENCOUNTER — Other Ambulatory Visit (HOSPITAL_COMMUNITY): Payer: Self-pay | Admitting: Nurse Practitioner

## 2017-10-01 DIAGNOSIS — M810 Age-related osteoporosis without current pathological fracture: Secondary | ICD-10-CM | POA: Diagnosis not present

## 2017-10-01 DIAGNOSIS — Z1159 Encounter for screening for other viral diseases: Secondary | ICD-10-CM | POA: Diagnosis not present

## 2017-10-01 DIAGNOSIS — M79642 Pain in left hand: Secondary | ICD-10-CM | POA: Diagnosis not present

## 2017-10-01 DIAGNOSIS — Z1231 Encounter for screening mammogram for malignant neoplasm of breast: Secondary | ICD-10-CM | POA: Diagnosis not present

## 2017-10-01 DIAGNOSIS — E8809 Other disorders of plasma-protein metabolism, not elsewhere classified: Secondary | ICD-10-CM | POA: Diagnosis not present

## 2017-10-01 DIAGNOSIS — D890 Polyclonal hypergammaglobulinemia: Secondary | ICD-10-CM | POA: Diagnosis not present

## 2017-10-01 DIAGNOSIS — M79641 Pain in right hand: Secondary | ICD-10-CM | POA: Diagnosis not present

## 2017-10-01 DIAGNOSIS — L989 Disorder of the skin and subcutaneous tissue, unspecified: Secondary | ICD-10-CM | POA: Diagnosis not present

## 2017-10-15 DIAGNOSIS — Z1231 Encounter for screening mammogram for malignant neoplasm of breast: Secondary | ICD-10-CM | POA: Diagnosis not present

## 2017-10-15 DIAGNOSIS — M810 Age-related osteoporosis without current pathological fracture: Secondary | ICD-10-CM | POA: Diagnosis not present

## 2017-10-15 DIAGNOSIS — Z13820 Encounter for screening for osteoporosis: Secondary | ICD-10-CM | POA: Diagnosis not present

## 2017-10-29 ENCOUNTER — Other Ambulatory Visit (HOSPITAL_COMMUNITY): Payer: Self-pay | Admitting: Family Medicine

## 2017-10-29 ENCOUNTER — Other Ambulatory Visit (HOSPITAL_COMMUNITY): Payer: Self-pay | Admitting: Nurse Practitioner

## 2017-11-01 ENCOUNTER — Other Ambulatory Visit (HOSPITAL_COMMUNITY): Payer: Self-pay | Admitting: Nurse Practitioner

## 2017-11-05 ENCOUNTER — Encounter: Payer: Self-pay | Admitting: Family Medicine

## 2017-11-08 DIAGNOSIS — L820 Inflamed seborrheic keratosis: Secondary | ICD-10-CM | POA: Diagnosis not present

## 2017-11-08 DIAGNOSIS — D485 Neoplasm of uncertain behavior of skin: Secondary | ICD-10-CM | POA: Diagnosis not present

## 2017-11-08 DIAGNOSIS — L570 Actinic keratosis: Secondary | ICD-10-CM | POA: Diagnosis not present

## 2017-11-08 DIAGNOSIS — D225 Melanocytic nevi of trunk: Secondary | ICD-10-CM | POA: Diagnosis not present

## 2017-11-08 DIAGNOSIS — L574 Cutis laxa senilis: Secondary | ICD-10-CM | POA: Diagnosis not present

## 2017-11-15 DIAGNOSIS — R922 Inconclusive mammogram: Secondary | ICD-10-CM | POA: Diagnosis not present

## 2017-11-15 DIAGNOSIS — R928 Other abnormal and inconclusive findings on diagnostic imaging of breast: Secondary | ICD-10-CM | POA: Diagnosis not present

## 2017-11-15 DIAGNOSIS — N6310 Unspecified lump in the right breast, unspecified quadrant: Secondary | ICD-10-CM | POA: Diagnosis not present

## 2017-11-15 DIAGNOSIS — J069 Acute upper respiratory infection, unspecified: Secondary | ICD-10-CM | POA: Diagnosis not present

## 2017-11-26 DIAGNOSIS — I10 Essential (primary) hypertension: Secondary | ICD-10-CM | POA: Diagnosis not present

## 2017-11-26 DIAGNOSIS — M79642 Pain in left hand: Secondary | ICD-10-CM | POA: Diagnosis not present

## 2017-11-26 DIAGNOSIS — L989 Disorder of the skin and subcutaneous tissue, unspecified: Secondary | ICD-10-CM | POA: Diagnosis not present

## 2017-11-26 DIAGNOSIS — I4892 Unspecified atrial flutter: Secondary | ICD-10-CM | POA: Diagnosis not present

## 2017-11-26 DIAGNOSIS — M79641 Pain in right hand: Secondary | ICD-10-CM | POA: Diagnosis not present

## 2017-11-26 DIAGNOSIS — Z6841 Body Mass Index (BMI) 40.0 and over, adult: Secondary | ICD-10-CM | POA: Diagnosis not present

## 2017-11-26 DIAGNOSIS — Z23 Encounter for immunization: Secondary | ICD-10-CM | POA: Diagnosis not present

## 2017-11-28 ENCOUNTER — Other Ambulatory Visit: Payer: Self-pay | Admitting: *Deleted

## 2017-11-28 ENCOUNTER — Other Ambulatory Visit (HOSPITAL_COMMUNITY): Payer: Self-pay | Admitting: Nurse Practitioner

## 2017-11-28 ENCOUNTER — Other Ambulatory Visit (HOSPITAL_COMMUNITY): Payer: Self-pay | Admitting: Family Medicine

## 2017-12-15 ENCOUNTER — Other Ambulatory Visit (HOSPITAL_COMMUNITY): Payer: Self-pay | Admitting: Nurse Practitioner

## 2017-12-25 DIAGNOSIS — L859 Epidermal thickening, unspecified: Secondary | ICD-10-CM | POA: Diagnosis not present

## 2017-12-25 DIAGNOSIS — D225 Melanocytic nevi of trunk: Secondary | ICD-10-CM | POA: Diagnosis not present

## 2017-12-25 DIAGNOSIS — L574 Cutis laxa senilis: Secondary | ICD-10-CM | POA: Diagnosis not present

## 2017-12-25 DIAGNOSIS — D485 Neoplasm of uncertain behavior of skin: Secondary | ICD-10-CM | POA: Diagnosis not present

## 2017-12-25 DIAGNOSIS — L723 Sebaceous cyst: Secondary | ICD-10-CM | POA: Diagnosis not present

## 2017-12-29 ENCOUNTER — Other Ambulatory Visit (HOSPITAL_COMMUNITY): Payer: Self-pay | Admitting: Nurse Practitioner

## 2018-01-28 DIAGNOSIS — L988 Other specified disorders of the skin and subcutaneous tissue: Secondary | ICD-10-CM | POA: Diagnosis not present

## 2018-01-28 DIAGNOSIS — D485 Neoplasm of uncertain behavior of skin: Secondary | ICD-10-CM | POA: Diagnosis not present

## 2018-01-28 DIAGNOSIS — L570 Actinic keratosis: Secondary | ICD-10-CM | POA: Diagnosis not present

## 2018-01-28 DIAGNOSIS — L218 Other seborrheic dermatitis: Secondary | ICD-10-CM | POA: Diagnosis not present

## 2018-02-25 DIAGNOSIS — I4892 Unspecified atrial flutter: Secondary | ICD-10-CM | POA: Diagnosis not present

## 2018-02-25 DIAGNOSIS — M79642 Pain in left hand: Secondary | ICD-10-CM | POA: Diagnosis not present

## 2018-02-25 DIAGNOSIS — Z1211 Encounter for screening for malignant neoplasm of colon: Secondary | ICD-10-CM | POA: Diagnosis not present

## 2018-02-25 DIAGNOSIS — R739 Hyperglycemia, unspecified: Secondary | ICD-10-CM | POA: Diagnosis not present

## 2018-02-25 DIAGNOSIS — E876 Hypokalemia: Secondary | ICD-10-CM | POA: Diagnosis not present

## 2018-02-25 DIAGNOSIS — M79641 Pain in right hand: Secondary | ICD-10-CM | POA: Diagnosis not present

## 2018-02-25 DIAGNOSIS — I10 Essential (primary) hypertension: Secondary | ICD-10-CM | POA: Diagnosis not present

## 2018-02-25 DIAGNOSIS — R251 Tremor, unspecified: Secondary | ICD-10-CM | POA: Diagnosis not present

## 2018-02-26 ENCOUNTER — Other Ambulatory Visit (HOSPITAL_COMMUNITY): Payer: Self-pay | Admitting: Nurse Practitioner

## 2018-03-04 DIAGNOSIS — R319 Hematuria, unspecified: Secondary | ICD-10-CM | POA: Diagnosis not present

## 2018-03-05 DIAGNOSIS — K760 Fatty (change of) liver, not elsewhere classified: Secondary | ICD-10-CM | POA: Diagnosis not present

## 2018-03-05 DIAGNOSIS — N2889 Other specified disorders of kidney and ureter: Secondary | ICD-10-CM | POA: Diagnosis not present

## 2018-03-05 DIAGNOSIS — K5730 Diverticulosis of large intestine without perforation or abscess without bleeding: Secondary | ICD-10-CM | POA: Diagnosis not present

## 2018-03-05 DIAGNOSIS — R310 Gross hematuria: Secondary | ICD-10-CM | POA: Diagnosis not present

## 2018-03-05 DIAGNOSIS — R319 Hematuria, unspecified: Secondary | ICD-10-CM | POA: Diagnosis not present

## 2018-03-14 DIAGNOSIS — R42 Dizziness and giddiness: Secondary | ICD-10-CM | POA: Diagnosis not present

## 2018-03-14 DIAGNOSIS — R609 Edema, unspecified: Secondary | ICD-10-CM | POA: Diagnosis not present

## 2018-03-14 DIAGNOSIS — I4892 Unspecified atrial flutter: Secondary | ICD-10-CM | POA: Diagnosis not present

## 2018-03-21 DIAGNOSIS — J40 Bronchitis, not specified as acute or chronic: Secondary | ICD-10-CM | POA: Diagnosis not present

## 2018-03-21 DIAGNOSIS — R0982 Postnasal drip: Secondary | ICD-10-CM | POA: Diagnosis not present

## 2018-03-22 DIAGNOSIS — Z981 Arthrodesis status: Secondary | ICD-10-CM | POA: Diagnosis not present

## 2018-03-22 DIAGNOSIS — Z79899 Other long term (current) drug therapy: Secondary | ICD-10-CM | POA: Diagnosis not present

## 2018-03-22 DIAGNOSIS — S0990XA Unspecified injury of head, initial encounter: Secondary | ICD-10-CM | POA: Diagnosis not present

## 2018-03-22 DIAGNOSIS — I4891 Unspecified atrial fibrillation: Secondary | ICD-10-CM | POA: Diagnosis not present

## 2018-03-22 DIAGNOSIS — I10 Essential (primary) hypertension: Secondary | ICD-10-CM | POA: Diagnosis not present

## 2018-03-22 DIAGNOSIS — Z4789 Encounter for other orthopedic aftercare: Secondary | ICD-10-CM | POA: Diagnosis not present

## 2018-03-22 DIAGNOSIS — S199XXA Unspecified injury of neck, initial encounter: Secondary | ICD-10-CM | POA: Diagnosis not present

## 2018-04-07 DIAGNOSIS — R319 Hematuria, unspecified: Secondary | ICD-10-CM | POA: Diagnosis not present

## 2018-04-07 DIAGNOSIS — Z87442 Personal history of urinary calculi: Secondary | ICD-10-CM | POA: Diagnosis not present

## 2018-04-10 DIAGNOSIS — M5416 Radiculopathy, lumbar region: Secondary | ICD-10-CM | POA: Diagnosis not present

## 2018-04-10 DIAGNOSIS — M549 Dorsalgia, unspecified: Secondary | ICD-10-CM | POA: Diagnosis not present

## 2018-04-15 DIAGNOSIS — M5416 Radiculopathy, lumbar region: Secondary | ICD-10-CM | POA: Diagnosis not present

## 2018-04-15 DIAGNOSIS — M47816 Spondylosis without myelopathy or radiculopathy, lumbar region: Secondary | ICD-10-CM | POA: Diagnosis not present

## 2018-04-15 DIAGNOSIS — M79604 Pain in right leg: Secondary | ICD-10-CM | POA: Diagnosis not present

## 2018-04-15 DIAGNOSIS — M48061 Spinal stenosis, lumbar region without neurogenic claudication: Secondary | ICD-10-CM | POA: Diagnosis not present

## 2018-04-15 DIAGNOSIS — M5136 Other intervertebral disc degeneration, lumbar region: Secondary | ICD-10-CM | POA: Diagnosis not present

## 2018-04-25 DIAGNOSIS — R1310 Dysphagia, unspecified: Secondary | ICD-10-CM | POA: Diagnosis not present

## 2018-04-25 DIAGNOSIS — R197 Diarrhea, unspecified: Secondary | ICD-10-CM | POA: Diagnosis not present

## 2018-04-25 DIAGNOSIS — R194 Change in bowel habit: Secondary | ICD-10-CM | POA: Diagnosis not present

## 2018-04-29 DIAGNOSIS — M6530 Trigger finger, unspecified finger: Secondary | ICD-10-CM | POA: Diagnosis not present

## 2018-04-29 DIAGNOSIS — M5416 Radiculopathy, lumbar region: Secondary | ICD-10-CM | POA: Diagnosis not present

## 2018-05-01 ENCOUNTER — Other Ambulatory Visit (HOSPITAL_COMMUNITY): Payer: Self-pay | Admitting: Family Medicine

## 2018-05-06 DIAGNOSIS — K210 Gastro-esophageal reflux disease with esophagitis: Secondary | ICD-10-CM | POA: Diagnosis not present

## 2018-05-06 DIAGNOSIS — R1310 Dysphagia, unspecified: Secondary | ICD-10-CM | POA: Diagnosis not present

## 2018-05-06 DIAGNOSIS — R194 Change in bowel habit: Secondary | ICD-10-CM | POA: Diagnosis not present

## 2018-05-06 DIAGNOSIS — Z1211 Encounter for screening for malignant neoplasm of colon: Secondary | ICD-10-CM | POA: Diagnosis not present

## 2018-05-06 DIAGNOSIS — K5730 Diverticulosis of large intestine without perforation or abscess without bleeding: Secondary | ICD-10-CM | POA: Diagnosis not present

## 2018-05-10 DIAGNOSIS — M5416 Radiculopathy, lumbar region: Secondary | ICD-10-CM | POA: Diagnosis not present

## 2018-05-10 DIAGNOSIS — M545 Low back pain: Secondary | ICD-10-CM | POA: Diagnosis not present

## 2018-05-13 DIAGNOSIS — R319 Hematuria, unspecified: Secondary | ICD-10-CM | POA: Diagnosis not present

## 2018-05-13 DIAGNOSIS — R32 Unspecified urinary incontinence: Secondary | ICD-10-CM | POA: Diagnosis not present

## 2018-05-16 DIAGNOSIS — M65342 Trigger finger, left ring finger: Secondary | ICD-10-CM | POA: Diagnosis not present

## 2018-05-16 DIAGNOSIS — M65332 Trigger finger, left middle finger: Secondary | ICD-10-CM | POA: Diagnosis not present

## 2018-05-16 DIAGNOSIS — M25541 Pain in joints of right hand: Secondary | ICD-10-CM | POA: Diagnosis not present

## 2018-05-16 DIAGNOSIS — M25542 Pain in joints of left hand: Secondary | ICD-10-CM | POA: Diagnosis not present

## 2018-05-27 DIAGNOSIS — M545 Low back pain: Secondary | ICD-10-CM | POA: Diagnosis not present

## 2018-05-27 DIAGNOSIS — I4892 Unspecified atrial flutter: Secondary | ICD-10-CM | POA: Diagnosis not present

## 2018-05-27 DIAGNOSIS — I10 Essential (primary) hypertension: Secondary | ICD-10-CM | POA: Diagnosis not present

## 2018-05-27 DIAGNOSIS — E876 Hypokalemia: Secondary | ICD-10-CM | POA: Diagnosis not present

## 2018-05-27 DIAGNOSIS — M5416 Radiculopathy, lumbar region: Secondary | ICD-10-CM | POA: Diagnosis not present

## 2018-05-28 DIAGNOSIS — R32 Unspecified urinary incontinence: Secondary | ICD-10-CM | POA: Diagnosis not present

## 2018-06-03 DIAGNOSIS — M5416 Radiculopathy, lumbar region: Secondary | ICD-10-CM | POA: Diagnosis not present

## 2018-06-03 DIAGNOSIS — M545 Low back pain: Secondary | ICD-10-CM | POA: Diagnosis not present

## 2018-06-07 DIAGNOSIS — M5416 Radiculopathy, lumbar region: Secondary | ICD-10-CM | POA: Diagnosis not present

## 2018-06-07 DIAGNOSIS — M545 Low back pain: Secondary | ICD-10-CM | POA: Diagnosis not present

## 2018-06-10 DIAGNOSIS — M5416 Radiculopathy, lumbar region: Secondary | ICD-10-CM | POA: Diagnosis not present

## 2018-06-10 DIAGNOSIS — M545 Low back pain: Secondary | ICD-10-CM | POA: Diagnosis not present

## 2018-06-20 DIAGNOSIS — M5416 Radiculopathy, lumbar region: Secondary | ICD-10-CM | POA: Diagnosis not present

## 2018-06-20 DIAGNOSIS — M545 Low back pain: Secondary | ICD-10-CM | POA: Diagnosis not present

## 2018-06-21 DIAGNOSIS — M5416 Radiculopathy, lumbar region: Secondary | ICD-10-CM | POA: Diagnosis not present

## 2018-06-21 DIAGNOSIS — H524 Presbyopia: Secondary | ICD-10-CM | POA: Diagnosis not present

## 2018-06-21 DIAGNOSIS — M545 Low back pain: Secondary | ICD-10-CM | POA: Diagnosis not present

## 2018-06-21 DIAGNOSIS — H2513 Age-related nuclear cataract, bilateral: Secondary | ICD-10-CM | POA: Diagnosis not present

## 2018-06-24 DIAGNOSIS — M545 Low back pain: Secondary | ICD-10-CM | POA: Diagnosis not present

## 2018-06-24 DIAGNOSIS — M5416 Radiculopathy, lumbar region: Secondary | ICD-10-CM | POA: Diagnosis not present

## 2018-06-27 DIAGNOSIS — R32 Unspecified urinary incontinence: Secondary | ICD-10-CM | POA: Diagnosis not present

## 2018-07-15 DIAGNOSIS — Z9889 Other specified postprocedural states: Secondary | ICD-10-CM | POA: Diagnosis not present

## 2018-07-15 DIAGNOSIS — M25511 Pain in right shoulder: Secondary | ICD-10-CM | POA: Diagnosis not present

## 2018-07-15 DIAGNOSIS — M19011 Primary osteoarthritis, right shoulder: Secondary | ICD-10-CM | POA: Diagnosis not present

## 2018-07-22 DIAGNOSIS — I4892 Unspecified atrial flutter: Secondary | ICD-10-CM | POA: Diagnosis not present

## 2018-07-22 DIAGNOSIS — Z7901 Long term (current) use of anticoagulants: Secondary | ICD-10-CM | POA: Diagnosis not present

## 2018-07-22 DIAGNOSIS — I10 Essential (primary) hypertension: Secondary | ICD-10-CM | POA: Diagnosis not present

## 2018-07-22 DIAGNOSIS — R7309 Other abnormal glucose: Secondary | ICD-10-CM | POA: Diagnosis not present

## 2018-07-22 DIAGNOSIS — J4520 Mild intermittent asthma, uncomplicated: Secondary | ICD-10-CM | POA: Diagnosis not present

## 2018-07-22 DIAGNOSIS — Z6841 Body Mass Index (BMI) 40.0 and over, adult: Secondary | ICD-10-CM | POA: Diagnosis not present

## 2018-07-22 DIAGNOSIS — E612 Magnesium deficiency: Secondary | ICD-10-CM | POA: Diagnosis not present

## 2018-07-22 DIAGNOSIS — E782 Mixed hyperlipidemia: Secondary | ICD-10-CM | POA: Diagnosis not present

## 2018-07-25 DIAGNOSIS — D72829 Elevated white blood cell count, unspecified: Secondary | ICD-10-CM | POA: Diagnosis not present

## 2018-07-25 DIAGNOSIS — H2511 Age-related nuclear cataract, right eye: Secondary | ICD-10-CM | POA: Diagnosis not present

## 2018-07-25 DIAGNOSIS — H25813 Combined forms of age-related cataract, bilateral: Secondary | ICD-10-CM | POA: Diagnosis not present

## 2018-07-25 DIAGNOSIS — Z01818 Encounter for other preprocedural examination: Secondary | ICD-10-CM | POA: Diagnosis not present

## 2018-07-30 IMAGING — DX DG CHEST 2V
2 series · 2 of 2 positions shown · non-contrast
Comparison: PA and lateral chest x-ray May 18, 2016

CLINICAL DATA: Seven onset of shortness of breath last night.
History of asthma, hypertension, TIAs, morbid obesity

EXAM:
CHEST  2 VIEW

[chest pa]
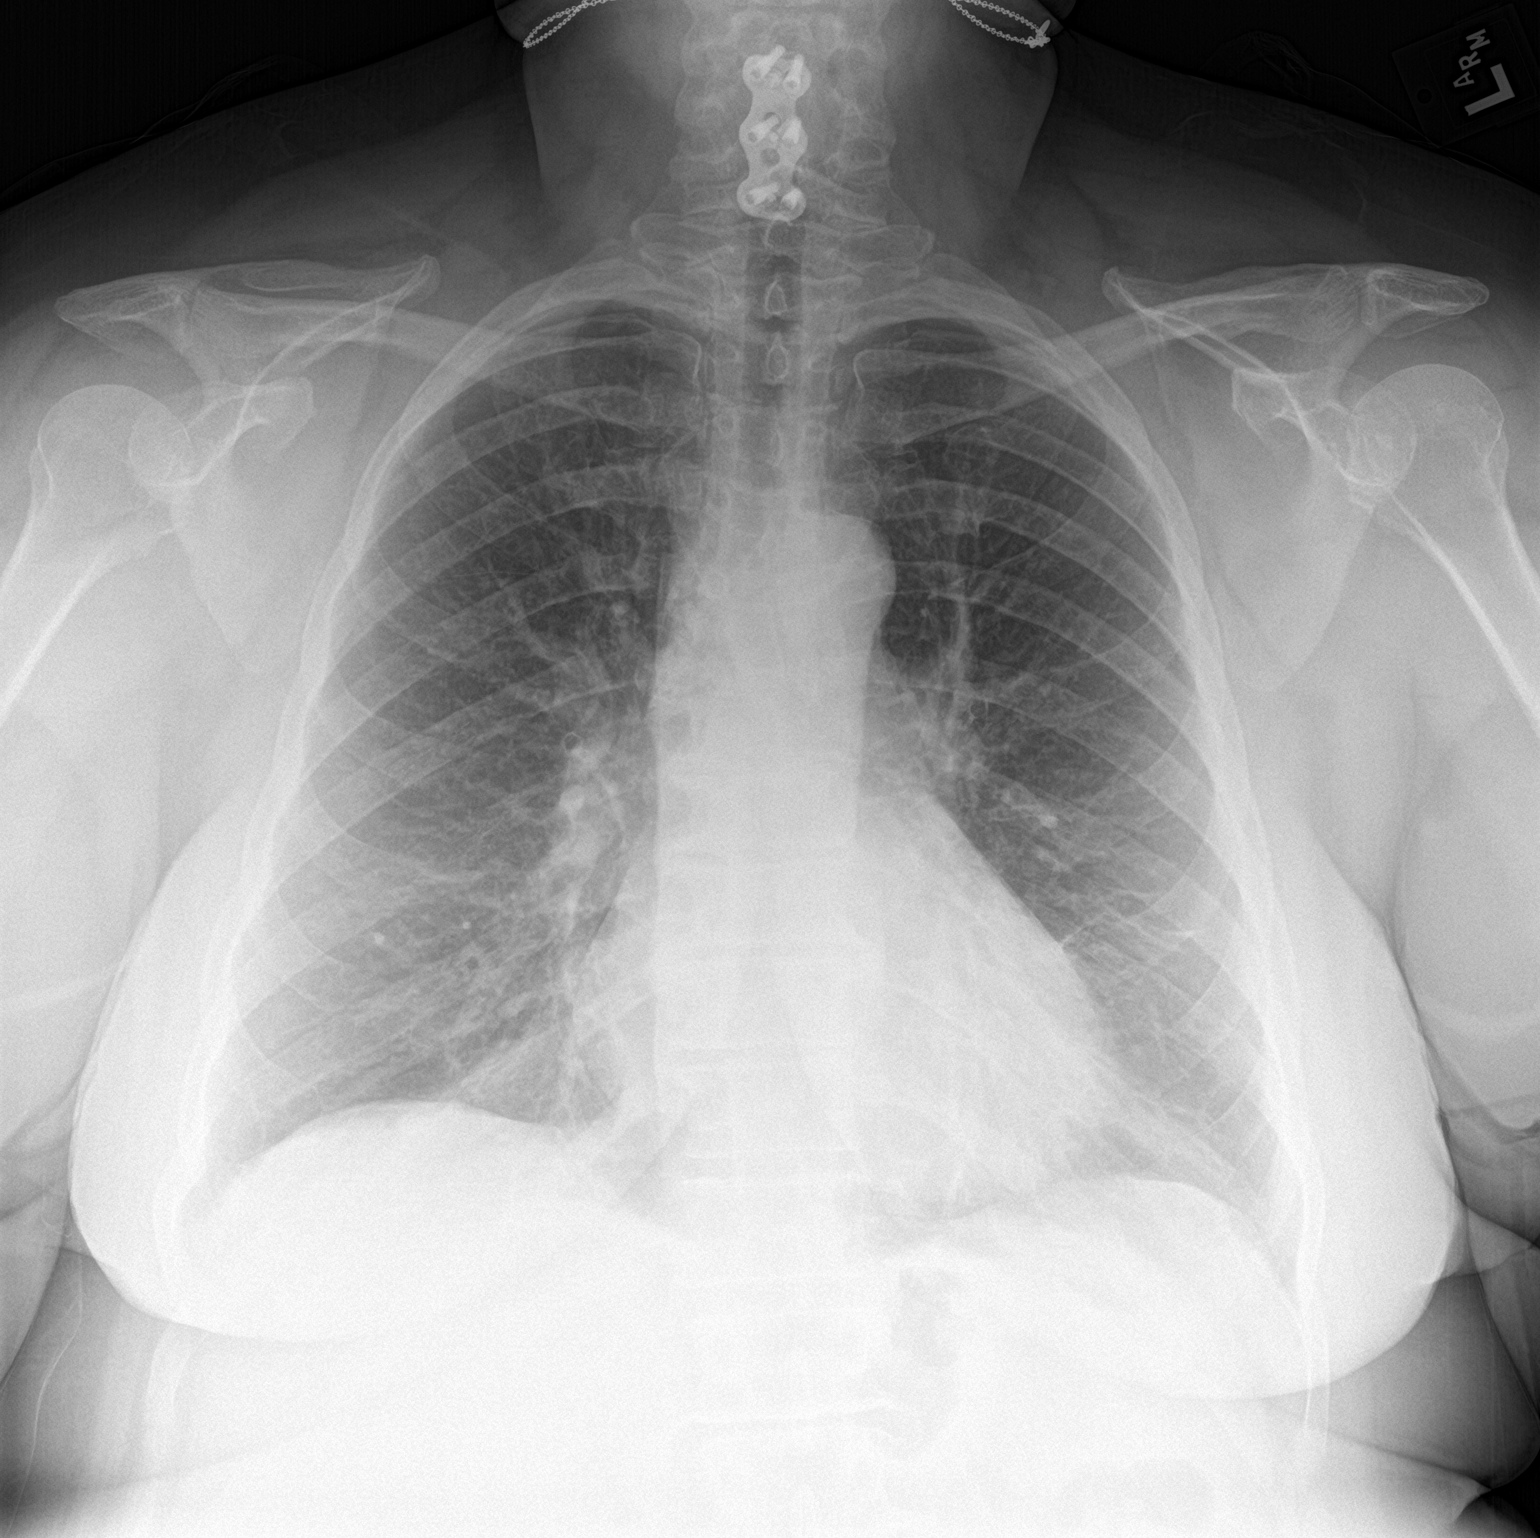

[chest lat]
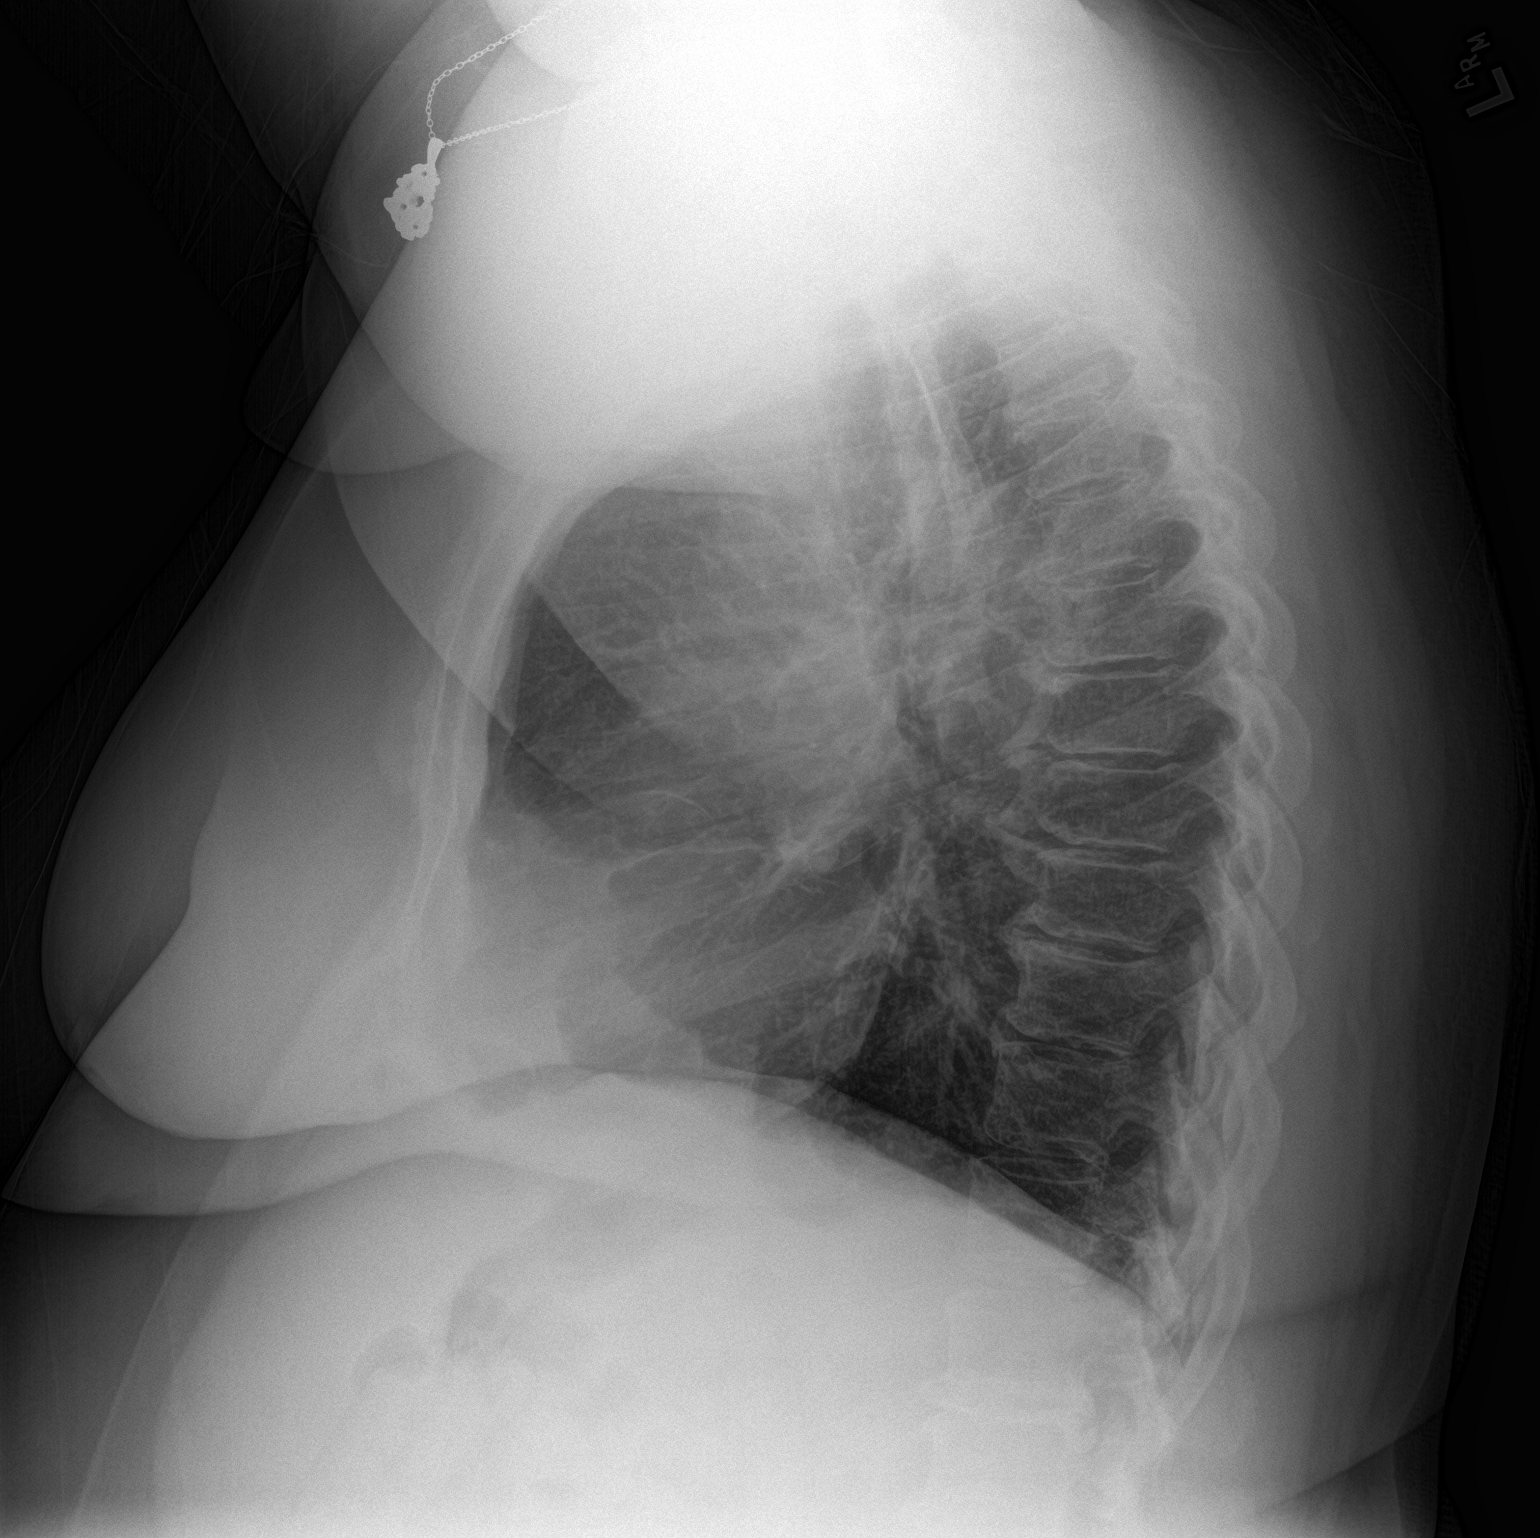

[2 of 2 positions shown; findings below may reference images not displayed]

FINDINGS: The lungs are well-expanded. The interstitial markings are coarse
though stable. The heart and pulmonary vascularity are normal. The
mediastinum is normal in width. There is no pleural effusion. There
is multilevel degenerative disc disease with anterior near bridging
osteophytes in the mid thoracic spine.
IMPRESSION: Chronic bronchitic-reactive airway changes. No pneumonia, CHF, nor
other acute cardiopulmonary abnormality.

## 2018-08-02 IMAGING — RF DG ABDOMEN 1V
1 series · 1 of 1 positions shown · non-contrast
Comparison: None.

CLINICAL DATA: Left ureteroscopy and stent placement

FLUOROSCOPY TIME:  52 seconds
Images:  1
EXAM:
ABDOMEN - 1 VIEW

[Series 1: run · 1 of 1 slices shown]
[im 1/1]
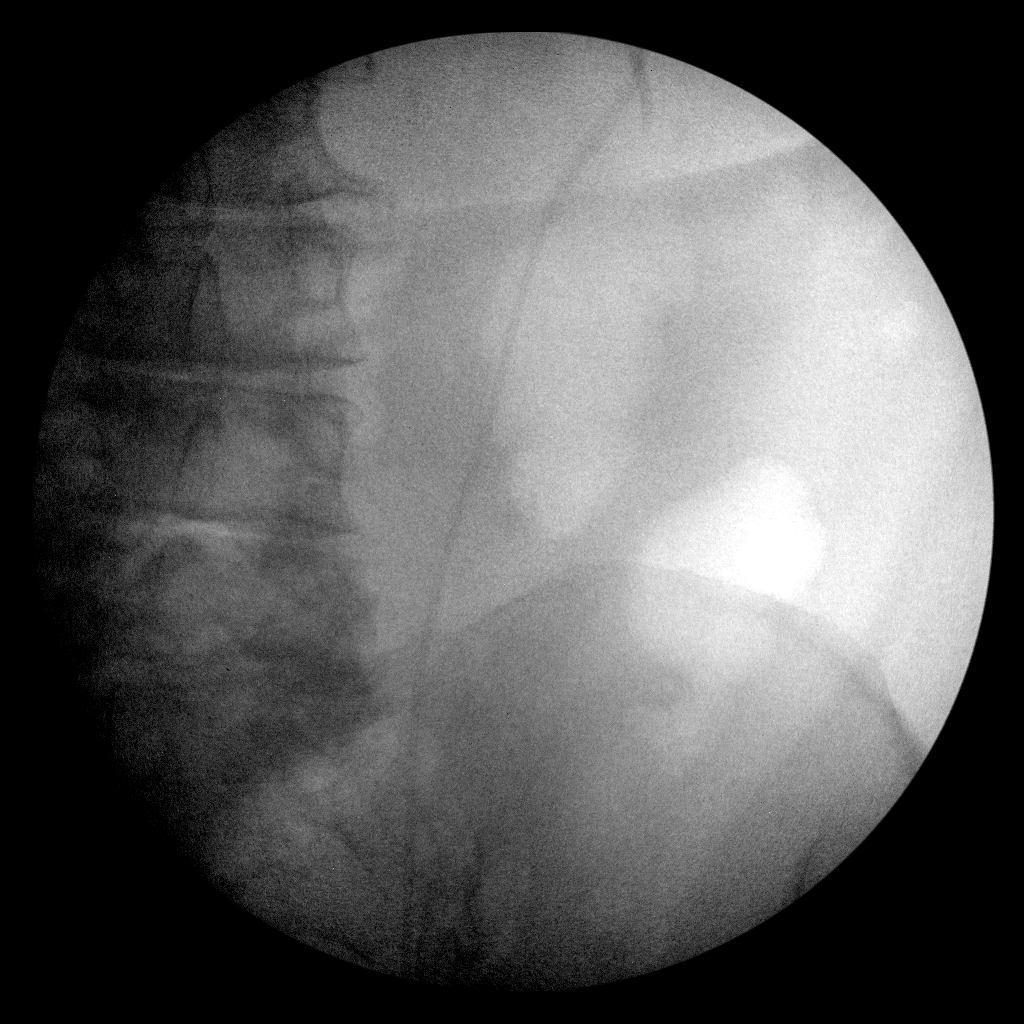

[1 of 1 positions shown; findings below may reference images not displayed]

FINDINGS: A single view was obtained demonstrating a ureteral stent, likely on
the left. The stent is not seen in its entirety.
IMPRESSION: Placement of a ureteral stent, not completely visualized.

## 2018-08-03 DIAGNOSIS — Z23 Encounter for immunization: Secondary | ICD-10-CM | POA: Diagnosis not present

## 2018-08-08 DIAGNOSIS — H25811 Combined forms of age-related cataract, right eye: Secondary | ICD-10-CM | POA: Diagnosis not present

## 2018-08-08 DIAGNOSIS — H2511 Age-related nuclear cataract, right eye: Secondary | ICD-10-CM | POA: Diagnosis not present

## 2018-08-09 DIAGNOSIS — M79662 Pain in left lower leg: Secondary | ICD-10-CM | POA: Diagnosis not present

## 2018-08-09 DIAGNOSIS — M898X9 Other specified disorders of bone, unspecified site: Secondary | ICD-10-CM | POA: Diagnosis not present

## 2018-08-09 DIAGNOSIS — E538 Deficiency of other specified B group vitamins: Secondary | ICD-10-CM | POA: Diagnosis not present

## 2018-08-09 DIAGNOSIS — Z79899 Other long term (current) drug therapy: Secondary | ICD-10-CM | POA: Diagnosis not present

## 2018-08-09 DIAGNOSIS — R5383 Other fatigue: Secondary | ICD-10-CM | POA: Diagnosis not present

## 2018-08-09 DIAGNOSIS — Z7901 Long term (current) use of anticoagulants: Secondary | ICD-10-CM | POA: Diagnosis not present

## 2018-08-09 DIAGNOSIS — D7589 Other specified diseases of blood and blood-forming organs: Secondary | ICD-10-CM | POA: Diagnosis not present

## 2018-08-09 DIAGNOSIS — R7309 Other abnormal glucose: Secondary | ICD-10-CM | POA: Diagnosis not present

## 2018-08-09 DIAGNOSIS — I4892 Unspecified atrial flutter: Secondary | ICD-10-CM | POA: Diagnosis not present

## 2018-08-09 DIAGNOSIS — E559 Vitamin D deficiency, unspecified: Secondary | ICD-10-CM | POA: Diagnosis not present

## 2018-08-09 DIAGNOSIS — D72829 Elevated white blood cell count, unspecified: Secondary | ICD-10-CM | POA: Diagnosis not present

## 2018-08-09 DIAGNOSIS — R7989 Other specified abnormal findings of blood chemistry: Secondary | ICD-10-CM | POA: Diagnosis not present

## 2018-08-09 DIAGNOSIS — M79661 Pain in right lower leg: Secondary | ICD-10-CM | POA: Diagnosis not present

## 2018-08-09 DIAGNOSIS — E612 Magnesium deficiency: Secondary | ICD-10-CM | POA: Diagnosis not present

## 2018-08-09 DIAGNOSIS — Z6841 Body Mass Index (BMI) 40.0 and over, adult: Secondary | ICD-10-CM | POA: Diagnosis not present

## 2018-08-16 DIAGNOSIS — E538 Deficiency of other specified B group vitamins: Secondary | ICD-10-CM | POA: Diagnosis not present

## 2018-08-29 DIAGNOSIS — H25812 Combined forms of age-related cataract, left eye: Secondary | ICD-10-CM | POA: Diagnosis not present

## 2018-08-29 DIAGNOSIS — H2512 Age-related nuclear cataract, left eye: Secondary | ICD-10-CM | POA: Diagnosis not present

## 2018-09-02 DIAGNOSIS — R5383 Other fatigue: Secondary | ICD-10-CM | POA: Diagnosis not present

## 2018-09-02 DIAGNOSIS — G4733 Obstructive sleep apnea (adult) (pediatric): Secondary | ICD-10-CM | POA: Diagnosis not present

## 2018-09-16 DIAGNOSIS — E538 Deficiency of other specified B group vitamins: Secondary | ICD-10-CM | POA: Diagnosis not present

## 2018-10-02 DIAGNOSIS — G4733 Obstructive sleep apnea (adult) (pediatric): Secondary | ICD-10-CM | POA: Diagnosis not present

## 2018-10-03 DIAGNOSIS — G4733 Obstructive sleep apnea (adult) (pediatric): Secondary | ICD-10-CM | POA: Diagnosis not present

## 2018-10-03 DIAGNOSIS — G4734 Idiopathic sleep related nonobstructive alveolar hypoventilation: Secondary | ICD-10-CM | POA: Diagnosis not present

## 2018-10-05 DIAGNOSIS — J45901 Unspecified asthma with (acute) exacerbation: Secondary | ICD-10-CM | POA: Diagnosis not present

## 2018-10-07 DIAGNOSIS — B353 Tinea pedis: Secondary | ICD-10-CM | POA: Diagnosis not present

## 2018-10-07 DIAGNOSIS — L821 Other seborrheic keratosis: Secondary | ICD-10-CM | POA: Diagnosis not present

## 2018-10-07 DIAGNOSIS — D1801 Hemangioma of skin and subcutaneous tissue: Secondary | ICD-10-CM | POA: Diagnosis not present

## 2018-10-07 DIAGNOSIS — B351 Tinea unguium: Secondary | ICD-10-CM | POA: Diagnosis not present

## 2018-10-17 DIAGNOSIS — E538 Deficiency of other specified B group vitamins: Secondary | ICD-10-CM | POA: Diagnosis not present

## 2018-10-22 DIAGNOSIS — R05 Cough: Secondary | ICD-10-CM | POA: Diagnosis not present

## 2018-10-22 DIAGNOSIS — Z7901 Long term (current) use of anticoagulants: Secondary | ICD-10-CM | POA: Diagnosis not present

## 2018-10-22 DIAGNOSIS — Z79899 Other long term (current) drug therapy: Secondary | ICD-10-CM | POA: Diagnosis not present

## 2018-10-22 DIAGNOSIS — J101 Influenza due to other identified influenza virus with other respiratory manifestations: Secondary | ICD-10-CM | POA: Diagnosis not present

## 2018-10-22 DIAGNOSIS — R0602 Shortness of breath: Secondary | ICD-10-CM | POA: Diagnosis not present

## 2018-10-22 DIAGNOSIS — I10 Essential (primary) hypertension: Secondary | ICD-10-CM | POA: Diagnosis not present

## 2018-10-22 DIAGNOSIS — M542 Cervicalgia: Secondary | ICD-10-CM | POA: Diagnosis not present

## 2018-10-22 DIAGNOSIS — J45909 Unspecified asthma, uncomplicated: Secondary | ICD-10-CM | POA: Diagnosis not present

## 2018-10-22 DIAGNOSIS — R51 Headache: Secondary | ICD-10-CM | POA: Diagnosis not present

## 2018-10-31 DIAGNOSIS — J069 Acute upper respiratory infection, unspecified: Secondary | ICD-10-CM | POA: Diagnosis not present

## 2018-10-31 DIAGNOSIS — I4892 Unspecified atrial flutter: Secondary | ICD-10-CM | POA: Diagnosis not present

## 2018-11-11 DIAGNOSIS — J45909 Unspecified asthma, uncomplicated: Secondary | ICD-10-CM | POA: Diagnosis not present

## 2018-11-11 DIAGNOSIS — J45901 Unspecified asthma with (acute) exacerbation: Secondary | ICD-10-CM | POA: Diagnosis not present

## 2018-11-11 DIAGNOSIS — H02831 Dermatochalasis of right upper eyelid: Secondary | ICD-10-CM | POA: Diagnosis not present

## 2018-11-11 DIAGNOSIS — J4520 Mild intermittent asthma, uncomplicated: Secondary | ICD-10-CM | POA: Diagnosis not present

## 2018-11-11 DIAGNOSIS — H02834 Dermatochalasis of left upper eyelid: Secondary | ICD-10-CM | POA: Diagnosis not present

## 2018-11-11 DIAGNOSIS — R05 Cough: Secondary | ICD-10-CM | POA: Diagnosis not present

## 2018-11-11 DIAGNOSIS — Z981 Arthrodesis status: Secondary | ICD-10-CM | POA: Diagnosis not present

## 2018-11-11 DIAGNOSIS — J019 Acute sinusitis, unspecified: Secondary | ICD-10-CM | POA: Diagnosis not present

## 2018-11-28 DIAGNOSIS — G4733 Obstructive sleep apnea (adult) (pediatric): Secondary | ICD-10-CM | POA: Diagnosis not present

## 2018-11-28 DIAGNOSIS — G4734 Idiopathic sleep related nonobstructive alveolar hypoventilation: Secondary | ICD-10-CM | POA: Diagnosis not present

## 2018-11-28 DIAGNOSIS — R5383 Other fatigue: Secondary | ICD-10-CM | POA: Diagnosis not present

## 2018-11-29 DIAGNOSIS — E8809 Other disorders of plasma-protein metabolism, not elsewhere classified: Secondary | ICD-10-CM | POA: Diagnosis not present

## 2018-11-29 DIAGNOSIS — E538 Deficiency of other specified B group vitamins: Secondary | ICD-10-CM | POA: Diagnosis not present

## 2018-11-29 DIAGNOSIS — D72829 Elevated white blood cell count, unspecified: Secondary | ICD-10-CM | POA: Diagnosis not present

## 2018-12-13 DIAGNOSIS — Z1231 Encounter for screening mammogram for malignant neoplasm of breast: Secondary | ICD-10-CM | POA: Diagnosis not present

## 2018-12-30 DIAGNOSIS — E538 Deficiency of other specified B group vitamins: Secondary | ICD-10-CM | POA: Diagnosis not present

## 2019-01-10 DIAGNOSIS — R399 Unspecified symptoms and signs involving the genitourinary system: Secondary | ICD-10-CM | POA: Diagnosis not present

## 2019-01-10 DIAGNOSIS — M545 Low back pain: Secondary | ICD-10-CM | POA: Diagnosis not present

## 2019-01-22 DIAGNOSIS — I4892 Unspecified atrial flutter: Secondary | ICD-10-CM | POA: Diagnosis not present

## 2019-01-22 DIAGNOSIS — R002 Palpitations: Secondary | ICD-10-CM | POA: Diagnosis not present

## 2019-01-24 DIAGNOSIS — I10 Essential (primary) hypertension: Secondary | ICD-10-CM | POA: Diagnosis not present

## 2019-01-24 DIAGNOSIS — Z79899 Other long term (current) drug therapy: Secondary | ICD-10-CM | POA: Diagnosis not present

## 2019-01-24 DIAGNOSIS — Z7901 Long term (current) use of anticoagulants: Secondary | ICD-10-CM | POA: Diagnosis not present

## 2019-01-24 DIAGNOSIS — I471 Supraventricular tachycardia: Secondary | ICD-10-CM | POA: Diagnosis not present

## 2019-01-24 DIAGNOSIS — E669 Obesity, unspecified: Secondary | ICD-10-CM | POA: Diagnosis not present

## 2019-01-24 DIAGNOSIS — I4892 Unspecified atrial flutter: Secondary | ICD-10-CM | POA: Diagnosis not present

## 2019-05-04 IMAGING — DX DG CHEST 1V PORT
2 series · 2 of 2 positions shown · non-contrast
Comparison: Chest radiograph performed 05/28/2017

CLINICAL DATA: Acute onset of shortness of breath and cough.
Initial encounter.

EXAM:
PORTABLE CHEST 1 VIEW

[chest ap (1 of 2)]
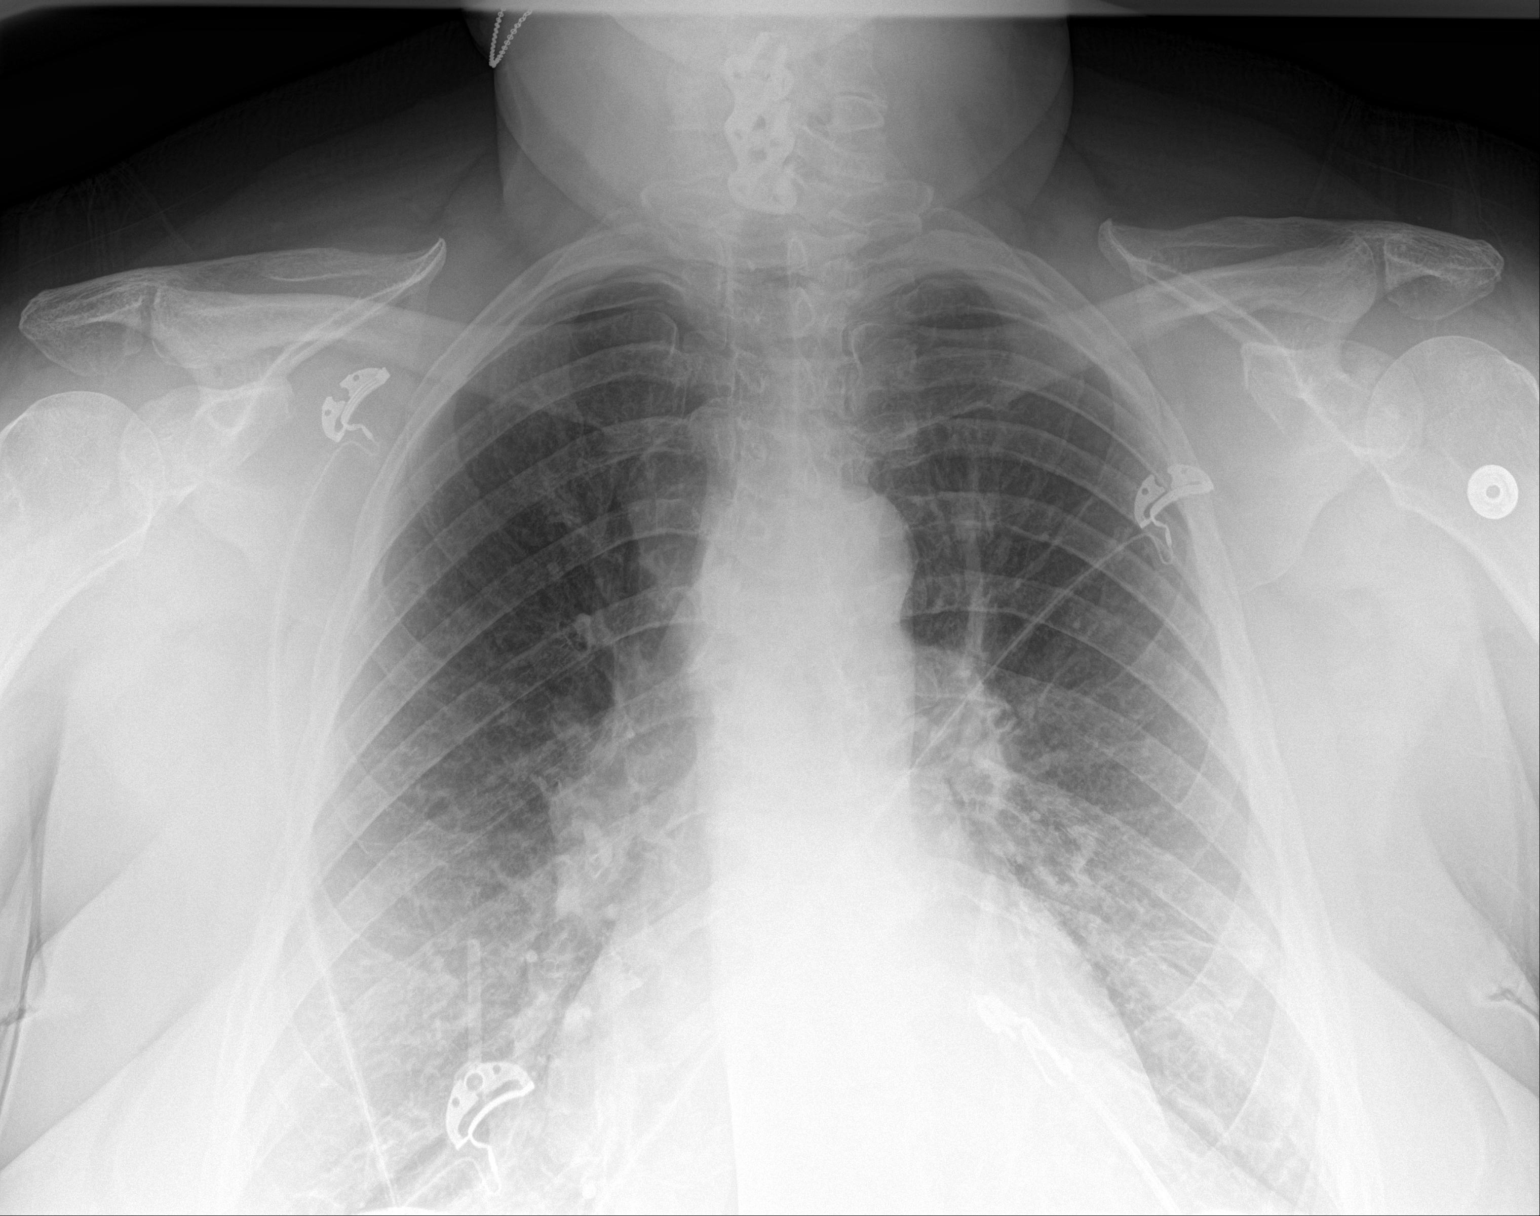

[chest ap (2 of 2)]
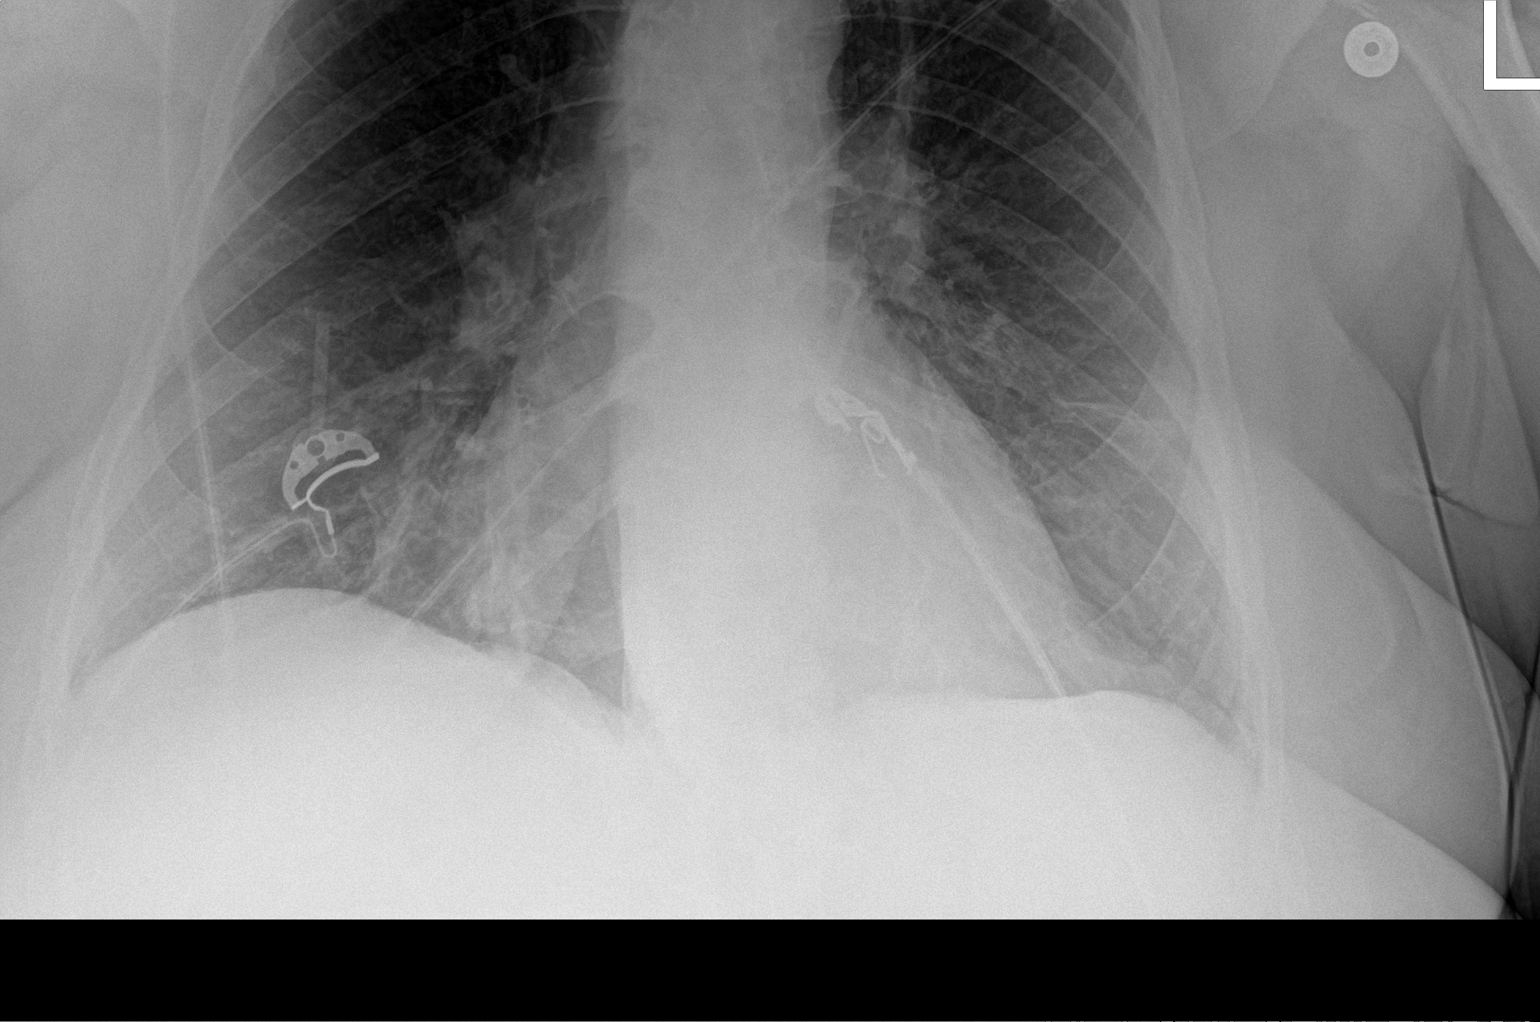

[2 of 2 positions shown; findings below may reference images not displayed]

FINDINGS: The lungs are well-aerated. Mild bibasilar atelectasis is noted.
There is no evidence of pleural effusion or pneumothorax.

The cardiomediastinal silhouette is borderline normal in size. No
acute osseous abnormalities are seen. Cervical spinal fusion
hardware is noted.
IMPRESSION: Mild bibasilar atelectasis noted.  Lungs otherwise clear.

## 2023-01-26 ENCOUNTER — Ambulatory Visit
Admission: EM | Admit: 2023-01-26 | Discharge: 2023-01-26 | Disposition: A | Discharge status: Home / Self Care | Payer: Medicare (Managed Care) | Attending: Family Medicine | Admitting: Family Medicine

## 2023-01-26 DIAGNOSIS — J4521 Mild intermittent asthma with (acute) exacerbation: Secondary | ICD-10-CM

## 2023-01-26 MED ORDER — PREDNISONE 20 MG PO TABS: ORAL_TABLET | ORAL | 0 refills | Status: AC

## 2023-01-26 MED ORDER — METHYLPREDNISOLONE SODIUM SUCC 125 MG IJ SOLR
80.0000 mg | Freq: Once | INTRAMUSCULAR | Status: AC
  Administered 2023-01-26: 80 mg via INTRAMUSCULAR

## 2023-01-26 NOTE — ED Triage Notes (Addendum)
Pt c/o cough with wheezing in last couple days. Hx of asthma. Takes Singulair prn. Also taking mucinex. Was seen in ED 3/20 for CP, all labs/tests were normal. No current scripts for inhalers.

## 2023-01-26 NOTE — Discharge Instructions (Signed)
Begin prednisone Saturday 01/27/23. Continue Singulair.  If symptoms become significantly worse during the night or over the weekend, proceed to the local emergency room.

## 2023-01-26 NOTE — ED Provider Notes (Signed)
[facility]    CSN: [id number] Arrival date & time: 01/26/23  1123      History   Chief Complaint Chief Complaint  Patient presents with   Cough    HPI Alison Richards is a 72 y.o. female.   Patient has asthma and complains of persistent cough for about two weeks, with increased wheezing during activity for the past two days.  She feels well otherwise, denying fever and pleuritic pain.  She takes Singulair prn which she resumed two days ago.  She was evaluated in the ED for chest pain on 01/17/23.  Her chest x-ray and other lab tests at that time were negative.  The history is provided by the patient.    Past Medical History:  Diagnosis Date   BMI 50.0-59.9, adult ([place])    Degenerative spinal arthritis    Dependence on nocturnal oxygen therapy    used in addition to cpap  for hypoxmia 2 liters at hs   GERD (gastroesophageal reflux disease)    PAST HX  H-PYLORI    H/O hiatal hernia    History of Helicobacter pylori infection    History of transient ischemic attack (TIA) 12/31/2015   Hyperlipemia    Hypertension    Left ureteral stone    Moderate persistent asthma    Nephrolithiasis    bilateral non-obstructive per ct 05-23-2016   OA (osteoarthritis)    Orthostatic lightheadedness    OSA on CPAP    setting 8   PONV (postoperative nausea and vomiting)    Wears dentures    full top   Wears glasses     Patient Active Problem List   Diagnosis Date Noted   Atrial flutter with rapid ventricular response ([place]) 05/28/2017   Atrial flutter ([place]) 05/28/2017   Carpal tunnel syndrome of left wrist    Stroke-like symptoms    TIA (transient ischemic attack) 12/31/2015   Peripheral neuropathy 12/31/2015   Acute dyspnea 12/31/2015   Asthma 12/31/2015   Morbid obesity with BMI of 50.0-59.9, adult ([place]) 12/31/2015   Bilateral lower extremity edema    Left lumbar radiculitis    Hyperlipidemia    Chest pain 06/18/2015   IFG (impaired fasting glucose)  02/13/2014   S/P right knee replacement 11/14/2011   ECZEMA 01/02/2011   HIP PAIN, LEFT 12/05/2010   THORACIC STRAIN 04/04/2010   FOOT PAIN, BILATERAL 04/04/2010   LEG EDEMA, BILATERAL 10/07/2009   BACK PAIN, LUMBAR 06/11/2009   Palpitations 03/16/2009   PAIN IN JOINT, MULTIPLE SITES 03/08/2009   OSA on CPAP 01/15/2009   H/O: depression 08/07/2006   HYPERTENSION, BENIGN SYSTEMIC 08/07/2006   Asthma with exacerbation 08/07/2006    Past Surgical History:  Procedure Laterality Date   ABDOMINAL HYSTERECTOMY  1999   complete   ANTERIOR CERVICAL DECOMP/DISCECTOMY FUSION  06/04/2001   C4 -- C6   BLADDER SURGERY  1995   CARPAL TUNNEL RELEASE Bilateral left 06-28-2009;  right 11-28-2009   CESAREAN SECTION  1979   w/ Bilateral Tubal Ligation   COLONOSCOPY     CYSTOSCOPY WITH STENT PLACEMENT Left 09/01/2016   Procedure: CYSTOSCOPY WITH STENT PLACEMENT;  Surgeon: Franchot Gallo, MD;  Location: [facility];  Service: Urology;  Laterality: Left;   CYSTOSCOPY/RETROGRADE/URETEROSCOPY/STONE EXTRACTION WITH BASKET Left 09/01/2016   Procedure: CYSTOSCOPY/RETROGRADE/URETEROSCOPY/STONE EXTRACTION WITH BASKET;  Surgeon: Franchot Gallo, MD;  Location: [facility];  Service: Urology;  Laterality: Left;   ESOPHAGEAL MANOMETRY  09/02/2012   Procedure: ESOPHAGEAL MANOMETRY (EM);  Surgeon: Sable Feil,  MD;  Location: [facility];  Service: Endoscopy;  Laterality: N/A;   HOLMIUM LASER APPLICATION Left 0000000   Procedure: HOLMIUM LASER APPLICATION;  Surgeon: Franchot Gallo, MD;  Location: [facility];  Service: Urology;  Laterality: Left;   LAPAROSCOPIC CHOLECYSTECTOMY  01/15/2007   TONSILLECTOMY  age 71   TOTAL KNEE ARTHROPLASTY  11/14/2011   Procedure: TOTAL KNEE ARTHROPLASTY;  Surgeon: Mauri Pole, MD;  Location: [facility];  Service: Orthopedics;  Laterality: Right;  combined with general block   TOTAL KNEE ARTHROPLASTY Left 01/21/2007   TRIGGER FINGER RELEASE Right 12/25/2013   Procedure: RIGHT LONG  TRIGGER RELEASE ;  Surgeon: Tennis Must, MD;  Location: [facility] [facility];  Service: Orthopedics;  Laterality: Right;    OB History   No obstetric history on file.      Home Medications    Prior to Admission medications   Medication Sig Start Date End Date Taking? Authorizing Provider  furosemide (LASIX) 20 MG tablet Take 20 mg by mouth daily. 01/17/23  Yes [provider]  predniSONE (DELTASONE) 20 MG tablet Take one tab by mouth twice daily for 4 days, then one daily for 3 days. Take with food. 01/26/23  Yes Kandra Nicolas, MD  AMBULATORY NON FORMULARY MEDICATION Medication Name: Nocturnal oxygen 2 liters. Patient taking differently: Medication Name: Nocturnal oxygen 2 liters at bedtime. 01/05/17   Hali Marry, MD  budesonide (PULMICORT FLEXHALER) 180 MCG/ACT inhaler Inhale 2 puffs into the lungs 2 (two) times daily. 06/06/17   Hali Marry, MD  diltiazem (CARDIZEM CD) 120 MG 24 hr capsule Take 1 capsule (120 mg total) by mouth at bedtime. 07/24/17   Sherran Needs, NP  diltiazem (CARDIZEM LA) 180 MG 24 hr tablet Take 1 tablet every day by oral route.    [provider]  ELIQUIS 5 MG TABS tablet TAKE 1 TABLET BY MOUTH TWICE A DAY 12/17/17   Sherran Needs, NP  flecainide (TAMBOCOR) 50 MG tablet Take 0.5 tablets (25 mg total) by mouth every 12 (twelve) hours. 07/27/17   Sherran Needs, NP  flecainide (TAMBOCOR) 50 MG tablet TAKE 1 TABLET BY MOUTH EVERY 12 HOURS 10/29/17   Sherran Needs, NP  flecainide (TAMBOCOR) 50 MG tablet TAKE 1 TABLET BY MOUTH EVERY 12 HOURS 12/17/17   Sherran Needs, NP  levalbuterol [facility] HFA) 45 MCG/ACT inhaler Inhale 2 puffs into the lungs every 6 (six) hours as needed for wheezing. 06/06/17   Hali Marry, MD  magnesium oxide (MAG-OX) 400 MG tablet Take 0.5 tablets (200 mg total) by mouth daily. 07/24/17   Sherran Needs, NP  naproxen sodium (ANAPROX) 220 MG tablet Take 220-440 mg by mouth 2 (two) times  daily as needed (for pain).    [provider]  potassium chloride (K-DUR) 10 MEQ tablet Take 1 tablet (10 mEq total) by mouth 2 (two) times daily. 08/03/17 11/01/17  Sherran Needs, NP  TEKTURNA HCT 150-25 MG TABS TAKE ONE (1) TABLET BY MOUTH ONCE A DAY 11/28/17   Hali Marry, MD    Family History Family History  Problem Relation Age of Onset   Alzheimer's disease Mother 44   Hypertension Mother    Hyperlipidemia Mother    Diverticulitis Mother    Alcohol abuse Father    Cancer Father    COPD Father    Lung cancer Father    Skin cancer Father    Prostate cancer Father    Colon  cancer Neg Hx     Social History Social History   Tobacco Use   Smoking status: Never   Smokeless tobacco: Never  Vaping Use   Vaping Use: Never used  Substance Use Topics   Alcohol use: No   Drug use: No     Allergies   Acetaminophen, Dilaudid [hydromorphone hcl], Hydromorphone, Angiotensin receptor blockers, Erythromycin ethylsuccinate, Lisinopril, Losartan potassium-hctz, Losartan potassium-hctz, Morphine sulfate, Statins, Valsartan, Latex, Other, Sulfa antibiotics, Sulfonamide derivatives, and Tape   Review of Systems Review of Systems No sore throat + cough No pleuritic pain + wheezing No nasal congestion No post-nasal drainage No sinus pain/pressure No itchy/red eyes No earache No hemoptysis No SOB No fever/chills No nausea No vomiting No abdominal pain No diarrhea No urinary symptoms No skin rash No fatigue No myalgias No headache   Physical Exam Triage Vital Signs ED Triage Vitals  Enc Vitals Group     BP 01/26/23 1138 (!) 143/84     Pulse Rate 01/26/23 1138 83     Resp 01/26/23 1138 18     Temp 01/26/23 1138 98.2 F (36.8 C)     Temp Source 01/26/23 1138 Oral     SpO2 01/26/23 1138 96 %     Weight --      Height --      Head Circumference --      Peak Flow --      Pain Score 01/26/23 1130 0     Pain Loc --      Pain Edu? --      Excl. in  [place]? --    No data found.  Updated Vital Signs BP (!) 143/84 (BP Location: Right Wrist)   Pulse 83   Temp 98.2 F (36.8 C) (Oral)   Resp 18   SpO2 96%   Visual Acuity Right Eye Distance:   Left Eye Distance:   Bilateral Distance:    Right Eye Near:   Left Eye Near:    Bilateral Near:     Physical Exam Nursing notes and Vital Signs reviewed. Appearance:  Patient appears stated age, and in no acute distress Eyes:  Pupils are equal, round, and reactive to light and accomodation.  Extraocular movement is intact.  Conjunctivae are not inflamed  Ears:  Canals normal.  Tympanic membranes normal.  Nose: Normal turbinates.  No sinus tenderness.   Pharynx:  Normal Neck:  Supple.  No adenopathy.  Lungs: Scattered inspiratory wheezes posteriorly.  Breath sounds are equal.  Moving air well. Heart:  Regular rate and rhythm without murmurs, rubs, or gallops.  Abdomen:  Nontender without masses or hepatosplenomegaly.  Bowel sounds are present.  No CVA or flank tenderness.  Extremities:  No edema.  Skin:  No rash present.   UC Treatments / Results  Labs (all labs ordered are listed, but only abnormal results are displayed) Labs Reviewed - No data to display  EKG   Radiology No results found.  Procedures Procedures (including critical care time)  Medications Ordered in UC Medications  methylPREDNISolone sodium succinate (SOLU-MEDROL) 125 mg/2 mL injection 80 mg (has no administration in time range)    Initial Impression / Assessment and Plan / UC Course  I have reviewed the triage vital signs and the nursing notes.  Pertinent labs & imaging results that were available during my care of the patient were reviewed by me and considered in my medical decision making (see chart for details).    No evidence infection. Administered Solumedrol 80mg  IM.  Begin prednisone burst/taper tomorrow. Followup with Family Doctor if not improved in one week.   Final Clinical Impressions(s) /  UC Diagnoses   Final diagnoses:  Mild intermittent asthma with exacerbation     Discharge Instructions      Begin prednisone Saturday 01/27/23. Continue Singulair.  If symptoms become significantly worse during the night or over the weekend, proceed to the local emergency room.     ED Prescriptions     Medication Sig Dispense Auth. Provider   predniSONE (DELTASONE) 20 MG tablet Take one tab by mouth twice daily for 4 days, then one daily for 3 days. Take with food. 11 tablet Kandra Nicolas, MD         Kandra Nicolas, MD 01/28/23 [phone]
